# Patient Record
Sex: Male | Born: 1955 | Hispanic: No | Smoking: Former smoker
Health system: Southern US, Community
[De-identification: ages and names within clinical notes are randomized; demographics above are authoritative.]

## PROBLEM LIST (undated history)

## (undated) DIAGNOSIS — R7302 Impaired glucose tolerance (oral): Secondary | ICD-10-CM

## (undated) DIAGNOSIS — Z87438 Personal history of other diseases of male genital organs: Secondary | ICD-10-CM

## (undated) DIAGNOSIS — E785 Hyperlipidemia, unspecified: Secondary | ICD-10-CM

## (undated) DIAGNOSIS — I2510 Atherosclerotic heart disease of native coronary artery without angina pectoris: Secondary | ICD-10-CM

## (undated) DIAGNOSIS — Z951 Presence of aortocoronary bypass graft: Secondary | ICD-10-CM

## (undated) DIAGNOSIS — F528 Other sexual dysfunction not due to a substance or known physiological condition: Secondary | ICD-10-CM

## (undated) DIAGNOSIS — I10 Essential (primary) hypertension: Secondary | ICD-10-CM

## (undated) HISTORY — DX: Hyperlipidemia, unspecified: E78.5

## (undated) HISTORY — DX: Impaired glucose tolerance (oral): R73.02

## (undated) HISTORY — DX: Other sexual dysfunction not due to a substance or known physiological condition: F52.8

## (undated) HISTORY — PX: CARDIAC CATHETERIZATION: SHX172

## (undated) HISTORY — DX: Presence of aortocoronary bypass graft: Z95.1

## (undated) HISTORY — DX: Atherosclerotic heart disease of native coronary artery without angina pectoris: I25.10

---

## 2001-04-17 HISTORY — PX: TRANSTHORACIC ECHOCARDIOGRAM: SHX275

## 2005-07-11 ENCOUNTER — Encounter: Payer: Self-pay | Admitting: Internal Medicine

## 2006-04-30 ENCOUNTER — Ambulatory Visit: Payer: Self-pay | Admitting: Internal Medicine

## 2006-04-30 LAB — CONVERTED CEMR LAB
ALT: 29 units/L (ref 0–40)
AST: 27 units/L (ref 0–37)
Albumin: 3.7 g/dL (ref 3.5–5.2)
Alkaline Phosphatase: 68 units/L (ref 39–117)
BUN: 15 mg/dL (ref 6–23)
Basophils Absolute: 0 10*3/uL (ref 0.0–0.1)
Basophils Relative: 0 % (ref 0.0–1.0)
CO2: 24 meq/L (ref 19–32)
Calcium: 8.6 mg/dL (ref 8.4–10.5)
Chloride: 107 meq/L (ref 96–112)
Chol/HDL Ratio, serum: 9.6
Cholesterol: 203 mg/dL (ref 0–200)
Creatinine, Ser: 1.2 mg/dL (ref 0.4–1.5)
Eosinophil percent: 1.1 % (ref 0.0–5.0)
GFR calc non Af Amer: 69 mL/min
Glomerular Filtration Rate, Af Am: 83 mL/min/{1.73_m2}
Glucose, Bld: 97 mg/dL (ref 70–99)
HCT: 46.1 % (ref 39.0–52.0)
HDL: 21.1 mg/dL — ABNORMAL LOW (ref >39.0)
Hemoglobin: 15.5 g/dL (ref 13.0–17.0)
LDL DIRECT: 152.5 mg/dL
Lymphocytes Relative: 37.5 % (ref 12.0–46.0)
MCHC: 33.6 g/dL (ref 30.0–36.0)
MCV: 91.9 fL (ref 78.0–100.0)
Monocytes Absolute: 0.5 10*3/uL (ref 0.2–0.7)
Monocytes Relative: 7.2 % (ref 3.0–11.0)
Neutro Abs: 4.2 10*3/uL (ref 1.4–7.7)
Neutrophils Relative %: 54.2 % (ref 43.0–77.0)
PSA: 0.4 ng/mL (ref 0.10–4.00)
Platelets: 348 10*3/uL (ref 150–400)
Potassium: 4 meq/L (ref 3.5–5.1)
RBC: 5.01 M/uL (ref 4.22–5.81)
RDW: 12.1 % (ref 11.5–14.6)
Sodium: 138 meq/L (ref 135–145)
TSH: 1.81 microintl units/mL (ref 0.35–5.50)
Total Bilirubin: 0.6 mg/dL (ref 0.3–1.2)
Total Protein: 6.9 g/dL (ref 6.0–8.3)
Triglyceride fasting, serum: 240 mg/dL (ref 0–149)
VLDL: 48 mg/dL — ABNORMAL HIGH (ref 0–40)
WBC: 7.6 10*3/uL (ref 4.5–10.5)

## 2006-05-07 ENCOUNTER — Ambulatory Visit: Payer: Self-pay | Admitting: Internal Medicine

## 2007-06-28 ENCOUNTER — Encounter: Payer: Self-pay | Admitting: Internal Medicine

## 2007-07-21 ENCOUNTER — Encounter: Payer: Self-pay | Admitting: Internal Medicine

## 2007-09-06 ENCOUNTER — Encounter: Payer: Self-pay | Admitting: Internal Medicine

## 2007-10-04 ENCOUNTER — Encounter: Payer: Self-pay | Admitting: Internal Medicine

## 2008-01-03 ENCOUNTER — Encounter: Payer: Self-pay | Admitting: Internal Medicine

## 2008-04-23 ENCOUNTER — Ambulatory Visit: Payer: Self-pay | Admitting: Internal Medicine

## 2008-04-24 LAB — CONVERTED CEMR LAB
ALT: 27 units/L (ref 0–53)
AST: 24 units/L (ref 0–37)
Bilirubin Urine: NEGATIVE
Bilirubin, Direct: 0.1 mg/dL (ref 0.0–0.3)
CO2: 26 meq/L (ref 19–32)
Calcium: 9.3 mg/dL (ref 8.4–10.5)
Creatinine, Ser: 1.1 mg/dL (ref 0.4–1.5)
Eosinophils Relative: 1.7 % (ref 0.0–5.0)
Glucose, Bld: 112 mg/dL — ABNORMAL HIGH (ref 70–99)
HCT: 47.2 % (ref 39.0–52.0)
Hemoglobin: 16.5 g/dL (ref 13.0–17.0)
Hgb A1c MFr Bld: 5.8 % (ref 4.6–6.0)
Ketones, ur: NEGATIVE mg/dL
Monocytes Absolute: 0.7 10*3/uL (ref 0.1–1.0)
Monocytes Relative: 7.8 % (ref 3.0–12.0)
Neutro Abs: 4.7 10*3/uL (ref 1.4–7.7)
PSA: 0.61 ng/mL (ref 0.10–4.00)
RDW: 12.2 % (ref 11.5–14.6)
Total CHOL/HDL Ratio: 7
Total Protein, Urine: NEGATIVE mg/dL
Total Protein: 7.5 g/dL (ref 6.0–8.3)
Triglycerides: 141 mg/dL (ref 0–149)
Urine Glucose: NEGATIVE mg/dL

## 2008-05-01 ENCOUNTER — Ambulatory Visit: Payer: Self-pay | Admitting: Internal Medicine

## 2008-05-01 DIAGNOSIS — F528 Other sexual dysfunction not due to a substance or known physiological condition: Secondary | ICD-10-CM

## 2008-05-01 DIAGNOSIS — E739 Lactose intolerance, unspecified: Secondary | ICD-10-CM

## 2008-05-01 DIAGNOSIS — E785 Hyperlipidemia, unspecified: Secondary | ICD-10-CM | POA: Insufficient documentation

## 2008-05-01 HISTORY — DX: Hyperlipidemia, unspecified: E78.5

## 2008-05-01 HISTORY — DX: Other sexual dysfunction not due to a substance or known physiological condition: F52.8

## 2008-06-05 ENCOUNTER — Telehealth: Payer: Self-pay | Admitting: Internal Medicine

## 2008-06-06 ENCOUNTER — Ambulatory Visit: Payer: Self-pay | Admitting: Internal Medicine

## 2008-06-06 LAB — CONVERTED CEMR LAB
AST: 24 units/L (ref 0–37)
HDL: 21.9 mg/dL — ABNORMAL LOW (ref >39.0)
Hgb A1c MFr Bld: 5.7 % (ref 4.6–6.0)
LDL Cholesterol: 80 mg/dL (ref 0–99)
Total Bilirubin: 0.6 mg/dL (ref 0.3–1.2)
Total CHOL/HDL Ratio: 5.5

## 2008-06-07 LAB — CONVERTED CEMR LAB

## 2008-08-21 ENCOUNTER — Telehealth (INDEPENDENT_AMBULATORY_CARE_PROVIDER_SITE_OTHER): Payer: Self-pay | Admitting: *Deleted

## 2008-10-17 ENCOUNTER — Ambulatory Visit: Payer: Self-pay | Admitting: Internal Medicine

## 2008-12-18 ENCOUNTER — Telehealth (INDEPENDENT_AMBULATORY_CARE_PROVIDER_SITE_OTHER): Payer: Self-pay | Admitting: *Deleted

## 2009-04-25 ENCOUNTER — Ambulatory Visit: Payer: Self-pay | Admitting: Internal Medicine

## 2009-04-25 LAB — CONVERTED CEMR LAB
ALT: 55 units/L — ABNORMAL HIGH (ref 0–53)
AST: 36 units/L (ref 0–37)
Albumin: 4.2 g/dL (ref 3.5–5.2)
Alkaline Phosphatase: 68 units/L (ref 39–117)
BUN: 18 mg/dL (ref 6–23)
Basophils Relative: 0.3 % (ref 0.0–3.0)
Bilirubin Urine: NEGATIVE
CO2: 28 meq/L (ref 19–32)
Chloride: 105 meq/L (ref 96–112)
Eosinophils Relative: 2.6 % (ref 0.0–5.0)
Glucose, Bld: 99 mg/dL (ref 70–99)
Ketones, ur: NEGATIVE mg/dL
Leukocytes, UA: NEGATIVE
Lymphocytes Relative: 30.8 % (ref 12.0–46.0)
MCV: 92.3 fL (ref 78.0–100.0)
Monocytes Absolute: 0.9 10*3/uL (ref 0.1–1.0)
Monocytes Relative: 8.7 % (ref 3.0–12.0)
Neutrophils Relative %: 57.6 % (ref 43.0–77.0)
Nitrite: NEGATIVE
PSA: 0.48 ng/mL (ref 0.10–4.00)
Potassium: 4.1 meq/L (ref 3.5–5.1)
RBC: 5.26 M/uL (ref 4.22–5.81)
Sodium: 139 meq/L (ref 135–145)
Specific Gravity, Urine: 1.025 (ref 1.000–1.030)
TSH: 2.38 microintl units/mL (ref 0.35–5.50)
Total CHOL/HDL Ratio: 6
Total Protein: 7.7 g/dL (ref 6.0–8.3)
Urobilinogen, UA: 0.2 (ref 0.0–1.0)
WBC: 10 10*3/uL (ref 4.5–10.5)
pH: 5.5 (ref 5.0–8.0)

## 2009-05-03 ENCOUNTER — Ambulatory Visit: Payer: Self-pay | Admitting: Internal Medicine

## 2009-08-01 ENCOUNTER — Encounter: Payer: Self-pay | Admitting: Internal Medicine

## 2009-09-18 ENCOUNTER — Telehealth: Payer: Self-pay | Admitting: Internal Medicine

## 2009-09-24 ENCOUNTER — Telehealth: Payer: Self-pay | Admitting: Internal Medicine

## 2010-06-17 NOTE — Letter (Signed)
Summary: Referral - not able to see patient  [facility]  [address] [place], [place] [address]   Phone: [phone]  Fax: [phone]    August 01, 2009    Corwin Levins, M.D. [address] [place], [place] [address]   Re:   Samuel Hawkins DOB:  1955-12-04 MRN:   [id number]    Dear Dr. Jonny Ruiz:  Thank you for your kind referral of the above patient.  We have attempted to schedule the recommended procedure Screening Colonoscopy but have not been able to schedule because:  ___ The patient was not available by phone and/or has not returned our calls.   X  The patient declined to schedule the procedure at this time.  We appreciate the referral and hope that we will have the opportunity to treat this patient in the future.    Sincerely,    [facility] [phone]

## 2010-06-17 NOTE — Progress Notes (Signed)
  Phone Note Refill Request  on Sep 18, 2009 10:40 AM  Refills Requested: Medication #1:  NIASPAN 500 MG CR-TABS 1po once daily   Dosage confirmed as above?Dosage Confirmed   Notes: Publishing copy Initial call taken by: Scharlene Gloss,  Sep 18, 2009 10:40 AM    Prescriptions: NIASPAN 500 MG CR-TABS (NIACIN (ANTIHYPERLIPIDEMIC)) 1po once daily  #30 x 0   Entered by:   Zella Ball Ewing   Authorized by:   Corwin Levins MD   Signed by:   Scharlene Gloss on 09/18/2009   Method used:   Faxed to ...       [facility] [address] [phone]* (retail)       [address] [place], [place]  [address]       Ph: [phone]       Fax: [phone]   RxID:   [id number]

## 2010-06-17 NOTE — Progress Notes (Signed)
  Phone Note Call from Patient Call back at Home Phone [phone]   Caller: Patient Call For: DrJohn Summary of Call: Pt states his cholesterol medicine refill was denied. Pt is out of medicine. Pt also wants labs for cholesterol? Please advise Initial call taken by: Verdell Face,  Sep 18, 2009 10:21 AM  Follow-up for Phone Call        called pt informed medication denied he is no longer on. Informed Dr. Jonny Ruiz filled Niaspan 04/2009 #90 with 3 refills and he should have plenty. Pt. stated he could not afford #90 and filled what he could and only has 3 left. Pt.  request to schedule with Dr. Jonny Ruiz to discuss cholesterol and will fill #30 Niaspan at [facility] on [address] to cover pt. until OV. Follow-up by: Scharlene Gloss,  Sep 18, 2009 10:40 AM

## 2010-06-17 NOTE — Progress Notes (Signed)
Summary: Chol med?  Phone Note Call from Patient Call back at Home Phone [phone]   Caller: Patient Summary of Call: pt called stating that Niaspan is too expensive and he would like to switch back to Simvastatin. Pt says that he did not experience leg cramps on Simvastatin or previous statin. Okay to switch on med list and refill? Initial call taken by: Margaret Pyle, CMA,  Sep 24, 2009 2:11 PM  Follow-up for Phone Call        ok to go back - to robin to handle Follow-up by: Corwin Levins MD,  Sep 24, 2009 3:10 PM    New/Updated Medications: SIMVASTATIN 40 MG TABS (SIMVASTATIN) 1 by mouth once daily Prescriptions: SIMVASTATIN 40 MG TABS (SIMVASTATIN) 1 by mouth once daily  #30 x 6   Entered by:   Scharlene Gloss   Authorized by:   Corwin Levins MD   Signed by:   Scharlene Gloss on 09/24/2009   Method used:   Faxed to ...       [facility] [address] [phone]* (retail)       [address] [place], [place]  [address]       Ph: [phone]       Fax: [phone]   RxID:   [id number]

## 2010-07-10 ENCOUNTER — Other Ambulatory Visit: Payer: MEDICARE

## 2010-07-10 ENCOUNTER — Other Ambulatory Visit: Payer: Self-pay | Admitting: Internal Medicine

## 2010-07-10 ENCOUNTER — Encounter (INDEPENDENT_AMBULATORY_CARE_PROVIDER_SITE_OTHER): Payer: Self-pay | Admitting: *Deleted

## 2010-07-10 DIAGNOSIS — E785 Hyperlipidemia, unspecified: Secondary | ICD-10-CM

## 2010-07-10 DIAGNOSIS — Z0389 Encounter for observation for other suspected diseases and conditions ruled out: Secondary | ICD-10-CM

## 2010-07-10 DIAGNOSIS — Z0000 Encounter for general adult medical examination without abnormal findings: Secondary | ICD-10-CM

## 2010-07-10 LAB — HEPATIC FUNCTION PANEL
AST: 24 U/L (ref 0–37)
Albumin: 3.9 g/dL (ref 3.5–5.2)
Alkaline Phosphatase: 69 U/L (ref 39–117)
Total Protein: 7 g/dL (ref 6.0–8.3)

## 2010-07-10 LAB — BASIC METABOLIC PANEL
CO2: 27 mEq/L (ref 19–32)
GFR: 91.72 mL/min (ref >60.00)
Glucose, Bld: 91 mg/dL (ref 70–99)
Potassium: 4.3 mEq/L (ref 3.5–5.1)
Sodium: 138 mEq/L (ref 135–145)

## 2010-07-10 LAB — CBC WITH DIFFERENTIAL/PLATELET
Eosinophils Relative: 1.3 % (ref 0.0–5.0)
Monocytes Relative: 6.7 % (ref 3.0–12.0)
Neutrophils Relative %: 61.2 % (ref 43.0–77.0)
Platelets: 339 10*3/uL (ref 150.0–400.0)
WBC: 9.6 10*3/uL (ref 4.5–10.5)

## 2010-07-10 LAB — URINALYSIS, ROUTINE W REFLEX MICROSCOPIC
Specific Gravity, Urine: >=1.03 (ref 1.000–1.030)
Total Protein, Urine: NEGATIVE
Urine Glucose: NEGATIVE
Urobilinogen, UA: 0.2 (ref 0.0–1.0)

## 2010-07-10 LAB — LIPID PANEL
HDL: 26.9 mg/dL — ABNORMAL LOW (ref >39.00)
Total CHOL/HDL Ratio: 8

## 2010-07-10 LAB — LDL CHOLESTEROL, DIRECT: Direct LDL: 176.2 mg/dL

## 2010-07-10 LAB — PSA: PSA: 0.56 ng/mL (ref 0.10–4.00)

## 2010-07-17 ENCOUNTER — Encounter (INDEPENDENT_AMBULATORY_CARE_PROVIDER_SITE_OTHER): Payer: Managed Care, Other (non HMO) | Admitting: Internal Medicine

## 2010-07-17 ENCOUNTER — Encounter: Payer: Self-pay | Admitting: Internal Medicine

## 2010-07-17 DIAGNOSIS — Z23 Encounter for immunization: Secondary | ICD-10-CM

## 2010-07-17 DIAGNOSIS — Z0000 Encounter for general adult medical examination without abnormal findings: Secondary | ICD-10-CM

## 2010-07-17 DIAGNOSIS — R3129 Other microscopic hematuria: Secondary | ICD-10-CM | POA: Insufficient documentation

## 2010-07-18 ENCOUNTER — Encounter (INDEPENDENT_AMBULATORY_CARE_PROVIDER_SITE_OTHER): Payer: Self-pay | Admitting: *Deleted

## 2010-07-24 NOTE — Assessment & Plan Note (Signed)
Summary: cpx/lb   Vital Signs:  Patient profile:   55 year old male Height:      67 inches Weight:      180.38 pounds BMI:     28.35 O2 Sat:      96 % on Room air Temp:     98.7 degrees F oral Pulse rate:   56 / minute BP sitting:   110 / 72  (left arm) Cuff size:   regular  Vitals Entered By: Zella Ball Ewing CMA (AAMA) (July 17, 2010 8:22 AM)  O2 Flow:  Room air  CC: Adult Physical/RE   CC:  Adult Physical/RE.  History of Present Illness: here for wellness and f/u;p overall doing ok, but Pt denies CP, worsening sob, doe, wheezing, orthopnea, pnd, worsening LE edema, palps, dizziness or syncope  Pt denies new neuro symptoms such as headache, facial or extremity weakness  Pt denies polydipsia, polyuria, or low sugar symptoms such as shakiness improved with eating.  Overall good compliance with meds, trying to follow low chol, DM diet, wt stable, little excercise however  Overall good compliance with meds, and good tolerability.  No fever, wt loss, night sweats, loss of appetite or other constitutional symptoms  Denies worsening depressive symptoms, suicidal ideation, or panick, though has some ongoing anxiety and ED symtpoms;  [facility]  Pt states good ability with ADL's, low fall risk, home safety reviewed and adequate, no significant change in hearing or vision, trying to follow lower chol diet, and occasionally active only with regular excercise.  Could not tolerate the simvastatin due to myalgias last yr, and was not able to get the colonscopy done.  No gross hematuria or GU symtpoms today  Preventive Screening-Counseling & Management      Drug Use:  no.    Problems Prior to Update: 1)  Preventive Health Care  (ICD-V70.0) 2)  Sexually Transmitted Disease, Exposure To  (ICD-V01.6) 3)  Glucose Intolerance  (ICD-271.3) 4)  Erectile Dysfunction  (ICD-302.72) 5)  Hyperlipidemia  (ICD-272.4) 6)  Preventive Health Care  (ICD-V70.0)  Medications Prior to  Update: 1)  Niaspan 500 Mg Cr-Tabs (Niacin (Antihyperlipidemic)) .Marland Kitchen.. 1po Once Daily 2)  Adult Aspirin Ec Low Strength 81 Mg Tbec (Aspirin) .Marland Kitchen.. 1 By Mouth Once Daily 3)  Levitra 20 Mg Tabs (Vardenafil Hcl) .Marland Kitchen.. 1 By Mouth Once Daily As Needed 4)  Simvastatin 40 Mg Tabs (Simvastatin) .Marland Kitchen.. 1 By Mouth Once Daily  Current Medications (verified): 1)  Adult Aspirin Ec Low Strength 81 Mg Tbec (Aspirin) .Marland Kitchen.. 1 By Mouth Once Daily 2)  Levitra 20 Mg Tabs (Vardenafil Hcl) .Marland Kitchen.. 1 By Mouth Once Daily As Needed 3)  Crestor 10 Mg Tabs (Rosuvastatin Calcium) .Marland Kitchen.. 1 By Mouth Once Daily  Allergies (verified): 1)  * Simcor 2)  Lipitor (Atorvastatin) 3)  Simvastatin  Past History:  Past Medical History: Last updated: 05/01/2008 Hyperlipidemia glucose intolerance E.D.  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 [employer]  Divorced 1 daughter Former Smoker Alcohol use-no Drug use-no  Risk Factors: Smoking Status: quit (05/01/2008)  Social History: work - Naval architect Firefighter  for private firm and [employer]  Divorced 1 daughter Former Smoker Alcohol use-no Drug use-no Drug Use:  no  Review of Systems  The patient denies anorexia, fever, vision loss, decreased hearing, hoarseness, chest pain, syncope, dyspnea on exertion, peripheral edema, prolonged cough, headaches, hemoptysis, abdominal pain, melena, hematochezia, severe indigestion/heartburn, hematuria, muscle weakness, suspicious skin lesions, transient blindness, difficulty walking, depression, unusual weight change, abnormal bleeding, enlarged lymph nodes, and angioedema.         all otherwise negative per pt -  still wants STD check done this yr as per last yr  Physical  Exam  General:  alert and overweight-appearing.   Head:  normocephalic and atraumatic.   Eyes:  vision grossly intact, pupils equal, and pupils round.   Ears:  R ear normal and L ear normal.   Nose:  no external deformity and no nasal discharge.   Mouth:  no gingival abnormalities and pharynx pink and moist.   Neck:  supple and no masses.   Lungs:  normal respiratory effort and normal breath sounds.   Heart:  normal rate and regular rhythm.   Abdomen:  soft, non-tender, and normal bowel sounds.   Msk:  no joint tenderness and no joint swelling.   Extremities:  no edema, no erythema  Neurologic:  cranial nerves II-XII intact and strength normal in all extremities.   Skin:  color normal and no rashes.   Psych:  not depressed appearing and moderately anxious.     Impression & Recommendations:  Problem # 1:  Preventive Health Care (ICD-V70.0) Overall doing well, age appropriate education and counseling updated, referral for preventive services and immunizations addressed, dietary counseling and smoking status adressed , most recent labs reviewed, ecg reviewed I have personally reviewed and have noted 1.The patient's medical and social history 2.Their use of alcohol, tobacco or illicit drugs 3.Their current medications and supplements 4. Functional ability including ADL's, fall risk, home safety risk, hearing & visual impairment 5.Diet and physical activities 6.Evidence for depression or mood disorders The patients weight, height, BMI  have been recorded in the chart I have made referrals, counseling and provided education to the patient based review of the above  Orders: EKG w/ Interpretation (93000) Gastroenterology Referral (GI)  Problem # 2:  HYPERLIPIDEMIA (ICD-272.4)  The following medications were removed from the medication list:    Niaspan 500 Mg Cr-tabs (Niacin (antihyperlipidemic)) .Marland Kitchen... 1po once daily His updated medication list for this problem includes:    Crestor 10  Mg Tabs (Rosuvastatin calcium) .Marland Kitchen... 1 by mouth once daily  Labs Reviewed: SGOT: 24 (07/10/2010)   SGPT: 26 (07/10/2010)   HDL:26.90 (07/10/2010), 24.60 (04/25/2009)  LDL:102 (04/25/2009), 80 (16/02/9603)  Chol:224 (07/10/2010), 154 (04/25/2009)  Trig:164.0 (07/10/2010), 136.0 (04/25/2009) treat as above, f/u any worsening signs or symptoms , f/u la bs 4 wks  Problem # 3:  MICROSCOPIC HEMATURIA (ICD-599.72) ok for full urological w/u - r/o malignancy Orders: Urology Referral (Urology)  Problem # 4:  SEXUALLY TRANSMITTED DISEASE, EXPOSURE TO (ICD-V01.6) for labs in 4 wks when has lipid f/u  Complete Medication List: 1)  Adult Aspirin Ec Low Strength 81 Mg Tbec (Aspirin) .Marland Kitchen.. 1 by mouth once daily 2)  Levitra 20 Mg Tabs (Vardenafil hcl) .Marland Kitchen.. 1 by mouth once daily as needed 3)  Crestor 10 Mg Tabs (Rosuvastatin calcium) .Marland Kitchen.. 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 11)  Please call the number on the [facility] Card for results of your testing  12)  You will be contacted about the referral(s) to: urology, and colonoscopy 13)  you had the tetanus shot today 14)  Please schedule a follow-up appointment in 1 year., or sooner if needed Prescriptions: LEVITRA 20 MG TABS (VARDENAFIL HCL) 1 by mouth once daily as needed  #5 x 11   Entered and Authorized by:   Corwin Levins MD   Signed by:   Corwin Levins MD on 07/17/2010   Method used:   Print then Give to Patient   RxID:   [id number] CRESTOR 10 MG TABS (ROSUVASTATIN CALCIUM) 1 by mouth once daily   #90 x 3   Entered and Authorized by:   Corwin Levins MD   Signed by:   Corwin Levins MD on 07/17/2010   Method used:   [facility] [address] [facility] [phone]* (retail)       [address] [place], [place]  [address]       Ph: [phone]       Fax: [phone]   RxID:   [id number]    Orders Added: 1)  EKG w/ Interpretation [93000] 2)  Urology Referral [Urology] 3)  Gastroenterology Referral [GI] 4)  Est. Patient 40-64 years [99396]  Appended Document: Immunization Entry      Orders Added: 1)  Tdap => 64yrs IM [90715] 2)  Admin 1st Vaccine [90471]    Immunizations Administered:  Tetanus Vaccine:    Vaccine Type: Tdap    Site: left deltoid    Mfr: Sanofi Pasteur    Dose: 0.5 ml    Route: IM    Given by: Zella Ball Ewing CMA (AAMA)    Exp. Date: 06/19/2011    Lot #: [id number]    VIS given: 04/04/08 version given July 17, 2010.

## 2010-07-24 NOTE — Letter (Signed)
Summary: [facility] Consult Scheduled Letter  [facility]  [address] [place], [place] [address]   Phone: [phone]  Fax: [phone]      07/18/2010 MRN: [id number]  ALAIN DESCHENE [address] [place], [place]  [address]  [place]    Dear Mr. SWAILS,      We have scheduled an appointment for you.  At the recommendation of Dr.John, we have scheduled you a consult with Dr Aldean Ast on 08/11/10 at 11:15am.  Their phone number is [phone].  If this appointment day and time is not convenient for you, please feel free to call the office of the doctor you are being referred to at the number listed above and reschedule the appointment.    [facility] [address] [place], [place] [address]     Thank you,  Patient Care Coordinator [facility]

## 2010-07-31 ENCOUNTER — Telehealth: Payer: Self-pay | Admitting: Internal Medicine

## 2010-08-05 NOTE — Progress Notes (Signed)
Summary: Crestor SE  Phone Note Call from Patient Call back at Home Phone [phone]   Caller: Patient Summary of Call: Pt called stating Rx given for cholesterol is causing muscle cramps, stomach upset and pain as well as a rash. Pt is requesting alternative medication, please advise. Initial call taken by: Margaret Pyle, CMA,  July 31, 2010 3:13 PM  Follow-up for Phone Call        ok to change to generic lipitor 10 mg per day -   for LAB f/u in 4 wks:   lipids 272.0                                    hepatic function panel:   v58.69  robin - to call pt to inform, and arrange any above, and do rx Follow-up by: Corwin Levins MD,  July 31, 2010 4:50 PM  Additional Follow-up for Phone Call Additional follow up Details #1::        called pt left msg. to call back Additional Follow-up by: Robin Ewing CMA Duncan Dull),  August 01, 2010 9:41 AM    Additional Follow-up for Phone Call Additional follow up Details #2::    called the patient and he has been on Liptor once before and had a reaction. He is requesting something else. Did schedule the pt. for labs on August 29, 2010 Follow-up by: Zella Ball Ewing CMA Duncan Dull),  August 01, 2010 11:42 AM  Additional Follow-up for Phone Call Additional follow up Details #3:: Details for Additional Follow-up Action Taken: I apologize, this is on his allergy and intolerance list;    d/c the lipitor; ok to start pravachol 40 mg once daily  with labs as above - robin to handle rx and labs Additional Follow-up by: Corwin Levins MD,  August 01, 2010 12:02 PM  New/Updated Medications: PRAVACHOL 40 MG TABS (PRAVASTATIN SODIUM) 1 by mouth once daily Prescriptions: PRAVACHOL 40 MG TABS (PRAVASTATIN SODIUM) 1 by mouth once daily  #30 x 11   Entered by:   Zella Ball Ewing CMA (AAMA)   Authorized by:   Corwin Levins MD   Signed by:   Scharlene Gloss CMA (AAMA) on 08/01/2010   Method used:   [facility] [phone]* (retail)       [address] [place], [place]  [address]       Ph: [phone]       Fax: [phone]   RxID:   [id number]   Appended Document: Crestor SE called patient informed of above information

## 2010-08-14 ENCOUNTER — Other Ambulatory Visit: Payer: Self-pay | Admitting: Internal Medicine

## 2010-08-14 ENCOUNTER — Other Ambulatory Visit: Payer: Managed Care, Other (non HMO)

## 2010-08-14 DIAGNOSIS — Z202 Contact with and (suspected) exposure to infections with a predominantly sexual mode of transmission: Secondary | ICD-10-CM

## 2010-08-14 DIAGNOSIS — Z79899 Other long term (current) drug therapy: Secondary | ICD-10-CM

## 2010-08-14 DIAGNOSIS — E7800 Pure hypercholesterolemia, unspecified: Secondary | ICD-10-CM

## 2010-08-29 ENCOUNTER — Other Ambulatory Visit: Payer: Managed Care, Other (non HMO)

## 2011-04-28 ENCOUNTER — Encounter: Payer: Self-pay | Admitting: *Deleted

## 2011-04-28 ENCOUNTER — Inpatient Hospital Stay (HOSPITAL_COMMUNITY)
Admission: EM | Admit: 2011-04-28 | Discharge: 2011-05-08 | DRG: 234 | Disposition: A | Discharge status: Home / Self Care | Payer: Managed Care, Other (non HMO) | Attending: Thoracic Surgery (Cardiothoracic Vascular Surgery) | Admitting: Thoracic Surgery (Cardiothoracic Vascular Surgery)

## 2011-04-28 ENCOUNTER — Other Ambulatory Visit: Payer: Self-pay

## 2011-04-28 ENCOUNTER — Emergency Department (HOSPITAL_COMMUNITY): Payer: Managed Care, Other (non HMO)

## 2011-04-28 DIAGNOSIS — E785 Hyperlipidemia, unspecified: Secondary | ICD-10-CM | POA: Diagnosis present

## 2011-04-28 DIAGNOSIS — Z23 Encounter for immunization: Secondary | ICD-10-CM

## 2011-04-28 DIAGNOSIS — E739 Lactose intolerance, unspecified: Secondary | ICD-10-CM | POA: Diagnosis present

## 2011-04-28 DIAGNOSIS — I200 Unstable angina: Secondary | ICD-10-CM | POA: Diagnosis present

## 2011-04-28 DIAGNOSIS — R55 Syncope and collapse: Secondary | ICD-10-CM | POA: Diagnosis present

## 2011-04-28 DIAGNOSIS — I2510 Atherosclerotic heart disease of native coronary artery without angina pectoris: Principal | ICD-10-CM | POA: Diagnosis present

## 2011-04-28 HISTORY — DX: Hyperlipidemia, unspecified: E78.5

## 2011-04-28 HISTORY — DX: Impaired glucose tolerance (oral): R73.02

## 2011-04-28 HISTORY — DX: Personal history of other diseases of male genital organs: Z87.438

## 2011-04-28 LAB — PROTIME-INR
INR: 1.03 (ref 0.00–1.49)
Prothrombin Time: 13.7 seconds (ref 11.6–15.2)

## 2011-04-28 LAB — COMPREHENSIVE METABOLIC PANEL
CO2: 22 mEq/L (ref 19–32)
Calcium: 9.2 mg/dL (ref 8.4–10.5)
Creatinine, Ser: 1.2 mg/dL (ref 0.50–1.35)
GFR calc Af Amer: 78 mL/min — ABNORMAL LOW (ref >90)
GFR calc non Af Amer: 67 mL/min — ABNORMAL LOW (ref >90)
Glucose, Bld: 93 mg/dL (ref 70–99)
Total Protein: 7.8 g/dL (ref 6.0–8.3)

## 2011-04-28 LAB — CBC
MCH: 31.6 pg (ref 26.0–34.0)
MCHC: 36.7 g/dL — ABNORMAL HIGH (ref 30.0–36.0)
Platelets: 292 10*3/uL (ref 150–400)
RBC: 4.87 MIL/uL (ref 4.22–5.81)

## 2011-04-28 LAB — CARDIAC PANEL(CRET KIN+CKTOT+MB+TROPI)
CK, MB: 5.9 ng/mL — ABNORMAL HIGH (ref 0.3–4.0)
Total CK: 442 U/L — ABNORMAL HIGH (ref 7–232)

## 2011-04-28 LAB — APTT: aPTT: 28 seconds (ref 24–37)

## 2011-04-28 MED ORDER — SODIUM CHLORIDE 0.9 % IV SOLN
999.0000 mL | INTRAVENOUS | Status: DC
  Administered 2011-04-28: 1000 mL via INTRAVENOUS

## 2011-04-28 MED ORDER — ASPIRIN 81 MG PO CHEW
324.0000 mg | CHEWABLE_TABLET | Freq: Once | ORAL | Status: AC
  Administered 2011-04-28: 324 mg via ORAL
  Filled 2011-04-28: qty 4

## 2011-04-28 NOTE — ED Notes (Signed)
Family at bedside. Pt ready for d/c/ pt told that MD/PA/NP would reassess him. Pt says he feels fine now and is pain free at this time. Pt here with c/o dizziness and blacking out assoc with cp and diaphoresis.

## 2011-04-28 NOTE — ED Notes (Signed)
No chest pain at this time.  Pt. Resting comfortably.

## 2011-04-28 NOTE — ED Notes (Signed)
Pt states he was having heartburn last night. Pt states this morning he started to fill fatigue and chest tightness. Pt states he pulled his car over and black out in his car

## 2011-04-28 NOTE — ED Provider Notes (Signed)
History     CSN: [id number] Arrival date & time: 04/28/2011  8:29 PM   First MD Initiated Contact with Patient 04/28/11 2133      No chief complaint on file.   (Consider location/radiation/quality/duration/timing/severity/associated sxs/prior treatment) HPI Comments: The patient is a 55 year old male with a chief complaint of chest pain and syncope earlier today at around 12:30 in the afternoon. 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 The patient estimates that he had passed out for no more than 30 seconds before he came around and states that after that the chest pain gradually subsided. He is currently chest pain-free. He has a medical history significant for risk factors of smoking, hyperlipidemia which is untreated, and early coronary artery disease in his family vis--vis his brother who died of a myocardial infarction in his 26s. At present he has no chest pain and is in no acute distress. He denies lower extremity edema or recent illness. He reports waxing and waning chest pain earlier this morning before 12:30 associated with lightheadedness. The patient has not previously been evaluated for chest pain or risk stratified for coronary artery disease.  The history is provided by the patient and the spouse.    History reviewed. No pertinent past medical history.  History reviewed. No pertinent past surgical history.  History reviewed. No pertinent family history.  History  Substance Use Topics  . Smoking status: Current Some Day Smoker  . Smokeless tobacco: Not on file  . Alcohol Use: Yes     occa      Review of Systems  Constitutional: Positive for diaphoresis and fatigue. Negative for fever, chills,  activity change and appetite change.  HENT: Negative for ear pain, congestion, sore throat, rhinorrhea, neck pain, postnasal drip and sinus pressure.   Eyes: Negative for visual disturbance.  Respiratory: Positive for chest tightness. Negative for cough, shortness of breath and wheezing.   Cardiovascular: Positive for chest pain. Negative for palpitations and leg swelling.  Gastrointestinal: Negative for nausea, vomiting, abdominal pain and diarrhea.  Genitourinary: Negative.   Musculoskeletal: Negative for myalgias and back pain.  Skin: Negative for color change, pallor and rash.  Neurological: Positive for syncope and light-headedness. Negative for dizziness, tremors, weakness, numbness and headaches.  Hematological: Does not bruise/bleed easily.  Psychiatric/Behavioral: Negative for confusion.    Allergies  Atorvastatin and Simvastatin  Home Medications  No current outpatient prescriptions on file.  BP 111/62  Pulse 66  Temp(Src) 98.9 F (37.2 C) (Oral)  Resp 20  SpO2 98%  Physical Exam  Nursing note and vitals reviewed. Constitutional: He is oriented to person, place, and time. He appears well-developed and well-nourished. No distress.  HENT:  Head: Normocephalic and atraumatic.  Mouth/Throat: Oropharynx is clear and moist.  Eyes: EOM are normal. Pupils are equal, round, and reactive to light.  Neck: Normal range of motion. Neck supple. No JVD present. No tracheal deviation present.  Cardiovascular: Normal rate, regular rhythm, S1 normal, S2 normal, normal heart sounds and intact distal pulses.   No extrasystoles are present. PMI is not displaced.  Exam reveals no gallop and no friction rub.   No murmur heard. Pulmonary/Chest: Effort normal and breath sounds normal. No accessory muscle usage or stridor. Not tachypneic. No respiratory distress.  He has no decreased breath sounds. He has no wheezes. He has no rhonchi. He has no rales. He exhibits no tenderness, no bony  tenderness, no crepitus and no retraction.  Abdominal: Soft. Bowel sounds are normal. He exhibits no distension and no mass. There is no tenderness. There is no rebound and no guarding.  Musculoskeletal: Normal range of motion. He exhibits no edema and no tenderness.  Neurological: He is alert and oriented to person, place, and time. No cranial nerve deficit. He exhibits normal muscle tone.  Skin: Skin is warm and dry. No rash noted. He is not diaphoretic. No erythema. No pallor.  Psychiatric: He has a normal mood and affect. His behavior is normal. Judgment and thought content normal.    ED Course  Procedures (including critical care time)   Date: 04/28/2011  Rate: 59  Rhythm: sinus bradycardia  QRS Axis: normal  Intervals: normal  ST/T Wave abnormalities: normal  Conduction Disutrbances:none  Narrative Interpretation: Non-provocative EKG  Old EKG Reviewed: none available    Labs Reviewed  CBC - Abnormal; Notable for the following:    MCHC 36.7 (*)    All other components within normal limits  CARDIAC PANEL(CRET KIN+CKTOT+MB+TROPI) - Abnormal; Notable for the following:    Total CK 442 (*)    CK, MB 5.9 (*)    All other components within normal limits  COMPREHENSIVE METABOLIC PANEL - Abnormal; Notable for the following:    Sodium 133 (*)    GFR calc non Af Amer 67 (*)    GFR calc Af Amer 78 (*)    All other components within normal limits  LIPASE, BLOOD  PROTIME-INR  APTT   Dg Chest 2 View  04/28/2011  *RADIOLOGY REPORT*  Clinical Data: Chest pain, fever, syncope.  Smoker.  CHEST - 2 VIEW  Comparison: None.  Findings: The heart size and pulmonary vascularity are normal. The lungs appear clear and expanded without focal air space disease or consolidation. No blunting of the costophrenic angles.  No pneumothorax.  Probable old right rib fracture.  Mild degenerative changes in the spine.  IMPRESSION: No evidence of active pulmonary disease.  Original Report Authenticated By:  Marlon Pel, M.D.     No diagnosis found.    MDM  The differential diagnosis includes arrhythmia, coronary artery disease and ACS, acute myocardial infarction, anemia, electrolyte abnormality, dehydration among other potential etiologies of his symptoms. The patient's symptoms themselves combined with risk factors and lack of previous risk stratification for coronary artery disease direct me towards admitting the patient for further evaluation before he would be deemed suitable for discharge home.        Felisa Bonier, MD 04/28/11 [phone]

## 2011-04-29 ENCOUNTER — Encounter (HOSPITAL_COMMUNITY): Payer: Self-pay | Admitting: Internal Medicine

## 2011-04-29 DIAGNOSIS — R55 Syncope and collapse: Secondary | ICD-10-CM | POA: Diagnosis present

## 2011-04-29 LAB — CARDIAC PANEL(CRET KIN+CKTOT+MB+TROPI)
CK, MB: 6.2 ng/mL (ref 0.3–4.0)
Relative Index: 1.8 (ref 0.0–2.5)
Total CK: 346 U/L — ABNORMAL HIGH (ref 7–232)
Total CK: 307 U/L — ABNORMAL HIGH (ref 7–232)
Troponin I: <0.3 ng/mL (ref <0.30)

## 2011-04-29 LAB — CREATININE, SERUM: Creatinine, Ser: 0.83 mg/dL (ref 0.50–1.35)

## 2011-04-29 LAB — CBC
HCT: 39.5 % (ref 39.0–52.0)
Hemoglobin: 13.4 g/dL (ref 13.0–17.0)
MCHC: 33.9 g/dL (ref 30.0–36.0)
RDW: 12.9 % (ref 11.5–15.5)
WBC: 7.1 10*3/uL (ref 4.0–10.5)

## 2011-04-29 LAB — TSH: TSH: 1.444 u[IU]/mL (ref 0.350–4.500)

## 2011-04-29 LAB — HEMOGLOBIN A1C
Hgb A1c MFr Bld: 5.5 % (ref <5.7)
Mean Plasma Glucose: 111 mg/dL (ref <117)

## 2011-04-29 MED ORDER — ACETAMINOPHEN 325 MG PO TABS: 650.0000 mg | ORAL_TABLET | Freq: Four times a day (QID) | ORAL | Status: DC | PRN

## 2011-04-29 MED ORDER — ENOXAPARIN SODIUM 40 MG/0.4ML ~~LOC~~ SOLN
40.0000 mg | Freq: Every day | SUBCUTANEOUS | Status: DC
  Administered 2011-04-29 (×2): 40 mg via SUBCUTANEOUS
  Filled 2011-04-29 (×3): qty 0.4

## 2011-04-29 MED ORDER — ROSUVASTATIN CALCIUM 10 MG PO TABS
10.0000 mg | ORAL_TABLET | Freq: Every day | ORAL | Status: DC
  Administered 2011-04-29 – 2011-04-30 (×2): 10 mg via ORAL
  Filled 2011-04-29 (×2): qty 1

## 2011-04-29 MED ORDER — SODIUM CHLORIDE 0.9 % IV SOLN: 250.0000 mL | INTRAVENOUS | Status: DC | PRN

## 2011-04-29 MED ORDER — DIAZEPAM 5 MG PO TABS
10.0000 mg | ORAL_TABLET | ORAL | Status: AC
  Administered 2011-04-30: 10 mg via ORAL
  Filled 2011-04-29: qty 2

## 2011-04-29 MED ORDER — ACETAMINOPHEN 650 MG RE SUPP: 650.0000 mg | Freq: Four times a day (QID) | RECTAL | Status: DC | PRN

## 2011-04-29 MED ORDER — SODIUM CHLORIDE 0.9 % IV SOLN
INTRAVENOUS | Status: DC
  Administered 2011-04-29 – 2011-04-30 (×4): via INTRAVENOUS

## 2011-04-29 MED ORDER — METOPROLOL SUCCINATE ER 25 MG PO TB24
25.0000 mg | ORAL_TABLET | Freq: Every day | ORAL | Status: DC
  Administered 2011-04-29: 25 mg via ORAL
  Filled 2011-04-29 (×2): qty 1

## 2011-04-29 MED ORDER — MORPHINE SULFATE 2 MG/ML IJ SOLN: 2.0000 mg | INTRAMUSCULAR | Status: DC | PRN

## 2011-04-29 MED ORDER — INFLUENZA VIRUS VACC SPLIT PF IM SUSP
0.5000 mL | INTRAMUSCULAR | Status: AC
  Filled 2011-04-29 (×2): qty 0.5

## 2011-04-29 MED ORDER — SODIUM CHLORIDE 0.9 % IJ SOLN
3.0000 mL | INTRAMUSCULAR | Status: DC | PRN
  Administered 2011-05-01: 6 mL via INTRAVENOUS

## 2011-04-29 MED ORDER — ASPIRIN 81 MG PO CHEW
324.0000 mg | CHEWABLE_TABLET | ORAL | Status: DC
  Filled 2011-04-29: qty 4

## 2011-04-29 MED ORDER — ZOLPIDEM TARTRATE 5 MG PO TABS
10.0000 mg | ORAL_TABLET | Freq: Every evening | ORAL | Status: DC | PRN
  Administered 2011-04-29: 10 mg via ORAL
  Filled 2011-04-29: qty 1

## 2011-04-29 NOTE — Progress Notes (Signed)
H&P reviewed ,patient seen and examined ,admitted for chest pain and syncope . Currently chest pain free ,however his description for his pain sounds typical ,associatied with diaphoresis .He is also c/o watery nonbloody diarrhea for 2 days  PLAN: 1/chest pain: sounds typical ,history of hyperlipidemia and glucose intolerance  ,his brother died form heart attack in his 56s, he smokes cigarettes sometimes . Given  Risk factors ,will consult cardiology(Dr Ganj) for possible stress test. 2/Syncope: Continue to observe on telemtry  2 D echo  Unremarkable  Check  Vitals or orthostasis and carotid doppler  Continue IVF  3/Diarrhea: Check stool for c diff PCR  Cont IVF

## 2011-04-29 NOTE — H&P (Signed)
PCP:   Oliver Barre, MD, MD   Chief Complaint: Transient chest discomfort  followed by syncope.   HPI: Samuel Hawkins is an 55 y.o. male with rather benign past medical history on no chronic medication, presents to [facility] long emergency room with a transient loss of consciousness. He has been active and never had any exertional chest pain in the past. 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  He denied any radiation of his pain, cough, pleuritic chest pain, fever, chills, abdominal cramps or pain, or any other symptomology.  Evaluation in emergency room included an unremarkable EKG and negative troponin, but he did have elevated CPK and MB fraction. His chest x-ray is clear. His other laboratory studies were unremarkable.  Rewiew of Systems:  The patient denies anorexia, fever, weight loss,, vision loss, decreased hearing, hoarseness,  dyspnea on exertion, peripheral edema, balance deficits, hemoptysis, abdominal pain, melena, hematochezia, severe indigestion/heartburn, hematuria, incontinence, genital sores, muscle weakness, suspicious skin lesions, transient blindness, difficulty walking, depression, unusual weight change, abnormal bleeding, enlarged lymph nodes, angioedema, and breast masses.    History reviewed. No pertinent past medical history.  History reviewed. No pertinent past surgical history.  Medications:  HOME MEDS: Prior to Admission medications   Not on File     Allergies:  Allergies  Allergen Reactions  . Atorvastatin     REACTION: muscle cramp  . Simvastatin     REACTION: muscle cramp    Social History:   reports that he has been smoking.  He does not have any smokeless tobacco history on file. He reports that he drinks alcohol. His drug history not on file.  Family History: History reviewed. No pertinent family  history.   Physical Exam: Filed Vitals:   04/28/11 2050 04/28/11 2327  BP: 111/62 109/44  Pulse: 66 52  Temp: 98.9 F (37.2 C) 97.8 F (36.6 C)  TempSrc: Oral Oral  Resp: 20 18  SpO2: 98% 98%   Blood pressure 109/44, pulse 52, temperature 97.8 F (36.6 C), temperature source Oral, resp. rate 18, SpO2 98.00%.  GEN:  Pleasant person lying in the stretcher in no acute distress; cooperative with exam PSYCH:  alert and oriented x4; does not appear anxious does not appear depressed; affect is normal HEENT: Mucous membranes pink and anicteric; PERRLA; EOM intact; no cervical lymphadenopathy nor thyromegaly or carotid bruit; no JVD; Breasts:: Not examined CHEST WALL: No tenderness CHEST: Normal respiration, clear to auscultation bilaterally HEART: Regular rate and rhythm; no murmurs rubs or gallops BACK: No kyphosis or scoliosis; no CVA tenderness ABDOMEN: Obese, soft non-tender; no masses, no organomegaly, normal abdominal bowel sounds; no pannus; no intertriginous candida. Rectal Exam: Not done EXTREMITIES: No bone or joint deformity; age-appropriate arthropathy of the hands and knees; no edema; no ulcerations. Genitalia: not examined PULSES: 2+ and symmetric SKIN: Normal hydration no rash or ulceration CNS: Cranial nerves 2-12 grossly intact no focal neurologic deficit   Labs & Imaging Results for orders placed during the hospital encounter of 04/28/11 (from the past 48 hour(s))  CBC     Status: Abnormal   Collection Time   04/28/11  9:38 PM      Component Value Range Comment   WBC 7.3  4.0 - 10.5 (K/uL)    RBC 4.87  4.22 - 5.81 (MIL/uL)    Hemoglobin 15.4  13.0 - 17.0 (g/dL)  HCT 42.0  39.0 - 52.0 (%)    MCV 86.2  78.0 - 100.0 (fL)    MCH 31.6  26.0 - 34.0 (pg)    MCHC 36.7 (*) 30.0 - 36.0 (g/dL)    RDW 40.9  81.1 - 91.4 (%)    Platelets 292  150 - 400 (K/uL)   CARDIAC PANEL(CRET KIN+CKTOT+MB+TROPI)     Status: Abnormal   Collection Time   04/28/11  9:38 PM       Component Value Range Comment   Total CK 442 (*) 7 - 232 (U/L)    CK, MB 5.9 (*) 0.3 - 4.0 (ng/mL)    Troponin I <0.30  <0.30 (ng/mL)    Relative Index 1.3  0.0 - 2.5    COMPREHENSIVE METABOLIC PANEL     Status: Abnormal   Collection Time   04/28/11  9:38 PM      Component Value Range Comment   Sodium 133 (*) 135 - 145 (mEq/L)    Potassium 3.6  3.5 - 5.1 (mEq/L)    Chloride 101  96 - 112 (mEq/L)    CO2 22  19 - 32 (mEq/L)    Glucose, Bld 93  70 - 99 (mg/dL)    BUN 19  6 - 23 (mg/dL)    Creatinine, Ser 7.82  0.50 - 1.35 (mg/dL)    Calcium 9.2  8.4 - 10.5 (mg/dL)    Total Protein 7.8  6.0 - 8.3 (g/dL)    Albumin 3.7  3.5 - 5.2 (g/dL)    AST 28  0 - 37 (U/L)    ALT 24  0 - 53 (U/L)    Alkaline Phosphatase 70  39 - 117 (U/L)    Total Bilirubin 0.3  0.3 - 1.2 (mg/dL)    GFR calc non Af Amer 67 (*) >90 (mL/min)    GFR calc Af Amer 78 (*) >90 (mL/min)   PROTIME-INR     Status: Normal   Collection Time   04/28/11  9:38 PM      Component Value Range Comment   Prothrombin Time 13.7  11.6 - 15.2 (seconds)    INR 1.03  0.00 - 1.49    APTT     Status: Normal   Collection Time   04/28/11  9:38 PM      Component Value Range Comment   aPTT 28  24 - 37 (seconds)   LIPASE, BLOOD     Status: Normal   Collection Time   04/28/11  9:38 PM      Component Value Range Comment   Lipase 32  11 - 59 (U/L)    Dg Chest 2 View  04/28/2011  *RADIOLOGY REPORT*  Clinical Data: Chest pain, fever, syncope.  Smoker.  CHEST - 2 VIEW  Comparison: None.  Findings: The heart size and pulmonary vascularity are normal. The lungs appear clear and expanded without focal air space disease or consolidation. No blunting of the costophrenic angles.  No pneumothorax.  Probable old right rib fracture.  Mild degenerative changes in the spine.  IMPRESSION: No evidence of active pulmonary disease.  Original Report Authenticated By: Marlon Pel, M.D.      Assessment Present on Admission:   .HYPERLIPIDEMIA .GLUCOSE INTOLERANCE .Syncope and collapse   PLAN:  This gentleman likely had a vasovagal syncope. Having said that he did have some chest tightness along with elevated CPKs and MB fractions.   We'll admit him for rule out. I will get a cardiac echo as well.  Will start him on an aspirin a day. He is stable otherwise and will be monitor for any arrhythmia.  He has history of glucose intolerance and is possible that he was hypoglycemic also. I note that he has borderline hyponatremia. This will be followed closely.  He does drink some alcohol but not excessively.  He is stable, full code, and will be admitted to [facility] hospitalist service.   Other plans as per orders.    Ananya Mccleese 04/29/2011, 2:04 AM

## 2011-04-29 NOTE — ED Notes (Signed)
Echo cardiogram done.

## 2011-04-29 NOTE — ED Notes (Signed)
Pt requesting to leave AMA again because he has got a bed yet. Dr. Nedra Hai paged.

## 2011-04-29 NOTE — Consults (Signed)
Please see dictated notes # [phone]

## 2011-04-29 NOTE — ED Notes (Signed)
MD at bedside. Dr Nedra Hai with admitting seeing pt

## 2011-04-29 NOTE — ED Notes (Signed)
Pt alert and oriented x4. Respirations even and unlabored, bilateral symmetrical rise and fall of chest. Skin warm and dry. In no acute distress. Denies needs. Selena Batten, RN admission nurse was in room.

## 2011-04-29 NOTE — Progress Notes (Signed)
  Echocardiogram 2D Echocardiogram has been performed.  Jorje [facility] 04/29/2011, 8:27 AM

## 2011-04-29 NOTE — ED Notes (Signed)
Pt said he was ready to go home and sign out ama. Explained to pt it was in his best interest to stay and that we were awaiting on a bed assignment. Pt agreed to stay.

## 2011-04-29 NOTE — ED Notes (Signed)
Report given to [facility]

## 2011-04-29 NOTE — Progress Notes (Signed)
*  PRELIMINARY RESULTS* Carotid dopplers performed. Preliminary findings showed no ICA stenosis. Antegrade vertebral artery flow bilateral.  Samuel Hawkins 04/29/2011, 3:21 PM

## 2011-04-29 NOTE — ED Notes (Signed)
Pt now agrees to stay and not leave AMA!!!

## 2011-04-30 ENCOUNTER — Other Ambulatory Visit: Payer: Self-pay

## 2011-04-30 ENCOUNTER — Encounter (HOSPITAL_COMMUNITY): Payer: Self-pay | Admitting: Cardiology

## 2011-04-30 ENCOUNTER — Encounter (HOSPITAL_COMMUNITY)
Admission: EM | Disposition: A | Discharge status: Home / Self Care | Payer: Self-pay | Source: Home / Self Care | Attending: Thoracic Surgery (Cardiothoracic Vascular Surgery)

## 2011-04-30 DIAGNOSIS — I2510 Atherosclerotic heart disease of native coronary artery without angina pectoris: Secondary | ICD-10-CM

## 2011-04-30 DIAGNOSIS — Z01810 Encounter for preprocedural cardiovascular examination: Secondary | ICD-10-CM

## 2011-04-30 HISTORY — PX: LEFT HEART CATHETERIZATION WITH CORONARY ANGIOGRAM: SHX5451

## 2011-04-30 LAB — BASIC METABOLIC PANEL
Chloride: 104 mEq/L (ref 96–112)
GFR calc Af Amer: >90 mL/min (ref >90)
GFR calc non Af Amer: >90 mL/min (ref >90)
Glucose, Bld: 90 mg/dL (ref 70–99)
Potassium: 3.4 mEq/L — ABNORMAL LOW (ref 3.5–5.1)
Sodium: 134 mEq/L — ABNORMAL LOW (ref 135–145)

## 2011-04-30 LAB — CARDIAC PANEL(CRET KIN+CKTOT+MB+TROPI)
CK, MB: 3.2 ng/mL (ref 0.3–4.0)
Relative Index: 2.1 (ref 0.0–2.5)
Total CK: 87 U/L (ref 7–232)
Troponin I: <0.3 ng/mL (ref <0.30)

## 2011-04-30 LAB — CBC
HCT: 39.3 % (ref 39.0–52.0)
Hemoglobin: 13.9 g/dL (ref 13.0–17.0)
RBC: 4.52 MIL/uL (ref 4.22–5.81)

## 2011-04-30 SURGERY — LEFT HEART CATHETERIZATION WITH CORONARY ANGIOGRAM
Anesthesia: LOCAL

## 2011-04-30 MED ORDER — ATROPINE SULFATE 0.1 MG/ML IJ SOLN: 1.0000 mg | Freq: Once | INTRAMUSCULAR | Status: DC

## 2011-04-30 MED ORDER — POTASSIUM CHLORIDE CRYS ER 20 MEQ PO TBCR
20.0000 meq | EXTENDED_RELEASE_TABLET | Freq: Two times a day (BID) | ORAL | Status: AC
  Administered 2011-04-30 (×2): 20 meq via ORAL
  Filled 2011-04-30: qty 1

## 2011-04-30 MED ORDER — ROSUVASTATIN CALCIUM 40 MG PO TABS
40.0000 mg | ORAL_TABLET | Freq: Every day | ORAL | Status: DC
  Administered 2011-04-30 – 2011-05-03 (×4): 40 mg via ORAL
  Filled 2011-04-30 (×5): qty 1

## 2011-04-30 MED ORDER — SODIUM CHLORIDE 0.9 % IV SOLN
INTRAVENOUS | Status: DC
  Administered 2011-04-30: 10 mL/h via INTRAVENOUS

## 2011-04-30 MED ORDER — PANTOPRAZOLE SODIUM 40 MG PO TBEC
40.0000 mg | DELAYED_RELEASE_TABLET | Freq: Two times a day (BID) | ORAL | Status: DC
  Administered 2011-04-30 – 2011-05-03 (×7): 40 mg via ORAL
  Filled 2011-04-30 (×7): qty 1

## 2011-04-30 MED ORDER — ONDANSETRON HCL 4 MG/2ML IJ SOLN
INTRAMUSCULAR | Status: AC
  Administered 2011-04-30: 4 mg via NASAL
  Filled 2011-04-30: qty 2

## 2011-04-30 MED ORDER — ASPIRIN 81 MG PO CHEW
81.0000 mg | CHEWABLE_TABLET | Freq: Every day | ORAL | Status: DC
  Administered 2011-05-01 – 2011-05-03 (×3): 81 mg via ORAL
  Filled 2011-04-30 (×3): qty 1

## 2011-04-30 MED ORDER — ONDANSETRON HCL 4 MG/2ML IJ SOLN
4.0000 mg | Freq: Four times a day (QID) | INTRAMUSCULAR | Status: DC | PRN
  Administered 2011-05-02 (×2): 4 mg via INTRAVENOUS
  Filled 2011-04-30 (×2): qty 2

## 2011-04-30 MED ORDER — ALUM & MAG HYDROXIDE-SIMETH 200-200-20 MG/5ML PO SUSP
30.0000 mL | Freq: Four times a day (QID) | ORAL | Status: DC | PRN
  Administered 2011-04-30: 30 mL via ORAL
  Filled 2011-04-30: qty 30

## 2011-04-30 MED ORDER — ADENOSINE 12 MG/4ML IV SOLN
16.0000 mL | Freq: Once | INTRAVENOUS | Status: DC
  Filled 2011-04-30: qty 16

## 2011-04-30 MED ORDER — EPTIFIBATIDE 75 MG/100ML IV SOLN
INTRAVENOUS | Status: AC
  Administered 2011-04-30: 2 ug/kg/min via INTRAVENOUS
  Filled 2011-04-30: qty 100

## 2011-04-30 MED ORDER — ENOXAPARIN SODIUM 80 MG/0.8ML ~~LOC~~ SOLN
80.0000 mg | Freq: Two times a day (BID) | SUBCUTANEOUS | Status: DC
Start: 2011-05-01 — End: 2011-05-01
  Administered 2011-05-01: 80 mg via SUBCUTANEOUS
  Filled 2011-04-30 (×3): qty 0.8

## 2011-04-30 MED ORDER — ASPIRIN 81 MG PO CHEW
81.0000 mg | CHEWABLE_TABLET | Freq: Once | ORAL | Status: AC
  Administered 2011-04-30: 81 mg via ORAL
  Filled 2011-04-30: qty 1

## 2011-04-30 MED ORDER — ONDANSETRON HCL 4 MG/2ML IJ SOLN: 4.0000 mg | Freq: Four times a day (QID) | INTRAMUSCULAR | Status: DC | PRN

## 2011-04-30 MED ORDER — DOPAMINE-DEXTROSE 3.2-5 MG/ML-% IV SOLN: 2.0000 ug/kg/min | INTRAVENOUS | Status: DC

## 2011-04-30 MED ORDER — POTASSIUM CHLORIDE CRYS ER 20 MEQ PO TBCR
EXTENDED_RELEASE_TABLET | ORAL | Status: AC
  Filled 2011-04-30: qty 1

## 2011-04-30 MED ORDER — DOPAMINE-DEXTROSE 3.2-5 MG/ML-% IV SOLN
2.0000 ug/kg/min | INTRAVENOUS | Status: DC
  Administered 2011-04-30: 3 ug/kg/min via INTRAVENOUS
  Administered 2011-05-01: 4.996 ug/kg/min via INTRAVENOUS
  Administered 2011-05-03: 5 ug/kg/min via INTRAVENOUS
  Filled 2011-04-30 (×2): qty 250

## 2011-04-30 MED ORDER — DIAZEPAM 2 MG PO TABS
2.0000 mg | ORAL_TABLET | ORAL | Status: DC | PRN
  Administered 2011-05-03 – 2011-05-04 (×2): 2 mg via ORAL
  Filled 2011-04-30 (×2): qty 1

## 2011-04-30 MED ORDER — SODIUM CHLORIDE 0.9 % IV SOLN
INTRAVENOUS | Status: AC
  Administered 2011-04-30: 20:00:00 via INTRAVENOUS

## 2011-04-30 MED ORDER — DOPAMINE-DEXTROSE 3.2-5 MG/ML-% IV SOLN
INTRAVENOUS | Status: AC
  Filled 2011-04-30: qty 250

## 2011-04-30 MED ORDER — SODIUM CHLORIDE 0.9 % IV SOLN: INTRAVENOUS | Status: AC

## 2011-04-30 MED ORDER — EPTIFIBATIDE 75 MG/100ML IV SOLN
2.0000 ug/kg/min | INTRAVENOUS | Status: AC
  Administered 2011-04-30 – 2011-05-01 (×2): 2 ug/kg/min via INTRAVENOUS
  Filled 2011-04-30 (×3): qty 100

## 2011-04-30 MED ORDER — ATROPINE SULFATE 1 MG/ML IJ SOLN
INTRAMUSCULAR | Status: AC
  Filled 2011-04-30: qty 1

## 2011-04-30 NOTE — Progress Notes (Signed)
Patient was not seen.He was transferred to [facility] cone cath lab today and underwent cardiac cath by Dr Jacinto Halim.Spoke to Dr Jacinto Halim who stated that he will follow up the patient today as an attending and requested the hospitalist to pick up his care tomorrow.I spoke to the flow manager and patient was assigned to [facility]  .Dr Waymon Amato was informed and he will kindly follow up the patient in the AM.

## 2011-04-30 NOTE — Brief Op Note (Addendum)
04/28/2011 - 04/30/2011  1:16 PM  PATIENT:  Samuel Hawkins  55 y.o. male  PRE-OPERATIVE DIAGNOSIS:  chest pain, [place], Syncope.   POST-OPERATIVE DIAGNOSIS:  Severe 3 vessel CAD.  PROCEDURE:  Procedure(s): LEFT HEART CATHETERIZATION WITH CORONARY ANGIOGRAM, FFR of LAD. Significant 7.3.   SURGEON:  Surgeon(s): Pamella Pert Dictation # [phone] [phone]

## 2011-04-30 NOTE — Progress Notes (Signed)
ANTICOAGULATION CONSULT NOTE - Initial Consult  Pharmacy Consult for Lovenox/Integrilin Indication: chest pain/ACS  Allergies  Allergen Reactions  . Atorvastatin Other (See Comments)    REACTION: muscle cramp  . Simvastatin Other (See Comments)    REACTION: muscle cramp    Patient Measurements: Height: 5\' 7"  (170.2 cm) Weight: 175 lb 14.4 oz (79.788 kg) (standing scale) IBW/kg (Calculated) : 66.1   Vital Signs: Temp: 98.2 F (36.8 C) (12/13 1700) Temp src: Oral (12/13 1700) BP: 108/70 mmHg (12/13 1007) Pulse Rate: 44  (12/13 1134)  Labs:  Basename 04/30/11 1619 04/30/11 0001 04/29/11 1605 04/29/11 1601 04/29/11 0510 04/28/11 2138  HGB -- 13.9 -- -- 13.4 --  HCT -- 39.3 -- -- 39.5 42.0  PLT -- 264 -- -- 277 292  APTT -- -- -- -- -- 28  LABPROT -- -- -- -- -- 13.7  INR -- -- -- -- -- 1.03  HEPARINUNFRC -- -- -- -- -- --  CREATININE -- 0.90 0.83 -- -- 1.20  CKTOTAL 87 240* -- 307* -- --  CKMB 3.2 5.1* -- 5.9* -- --  TROPONINI <0.30 <0.30 -- <0.30 -- --   Estimated Creatinine Clearance: 96.1 ml/min (by C-G formula based on Cr of 0.9).  Medical History: Past Medical History  Diagnosis Date  . Hyperlipidemia   . History of erectile dysfunction   . Glucose intolerance (impaired glucose tolerance)     history of    Assessment: 55 YOM admitted for chest pain, s/p Cath, with 3 vessel CAD. Integrilin infusion started at cath lab at 1345 to continue for total of 18 hours, and to start lovenox for anticoagulation. Pt. weight 80kg, est. crcl 96, hgb 13.9, plt 264.   Plan:  1. Continue Integrilin 14mcg/kg/hr unitl 0745 on 12/14 2. Start Lovenox 80mg  sq q12hrs, first dose 0300 at 12/14. 3. Well f/u cbc in am.  Riki Rusk 04/30/2011,5:41 PM

## 2011-04-30 NOTE — Progress Notes (Signed)
Pre CABG Dopplers completed at 17:55.  Preliminary report is ABI is >1.0 bilaterally with abnormal Doppler waveforms noted in the anterior tibial artery. Smiley Houseman 04/30/2011, 6:11 PM

## 2011-04-30 NOTE — Progress Notes (Signed)
Patient complains of nausea and "burning" in his throat. Patient proceeded to vomit a large amount of light brown emesis and now states that he feels better. HR remains bradycardic with SBP in the 80's on of dopamine. Patient denies chest pain. EKG obtained with no acute changes since previous one. Dr. Jacinto Halim notified and orders received to administer zofran and protonix and EKG faxed to him for further review. No additional orders received at this time. Will continue to monitor.  Blair Heys [facility] 04/30/2011 Late entry for 2000

## 2011-04-30 NOTE — Consult Note (Signed)
Reason for Consult:3 vessel CAD with unstable angina Referring Physician: Dr. [name] is an 55 y.o. male.  HPI: 55 yo male with history of hyperlipidemia and a strong family history of CAD presents with new onset chest pain. In usual state of good health until 12/10 when he had severe "heartburn" lasting ~ 8 hours. Ultimately resolved, but next morning at work had chest tightness and experienced LOC "for about 30 seconds". Admitted for CP and syncope. Cath today found to have severe 3 vessel CAD, mildly depressed LV function. Currently pain free.  Past Medical History  Diagnosis Date  . Hyperlipidemia   . History of erectile dysfunction   . Glucose intolerance (impaired glucose tolerance)     history of    History reviewed. No pertinent past surgical history.  Family history; Brother died of MI at age 11  Social History:  reports that he has been smoking Cigarettes.  He has never used smokeless tobacco. He reports that he drinks alcohol. He reports that he does not use illicit drugs.Reports only "social smoking" never a pack a day or every day smoker  Allergies:  Allergies  Allergen Reactions  . Atorvastatin Other (See Comments)    REACTION: muscle cramp  . Simvastatin Other (See Comments)    REACTION: muscle cramp    Medications:  Prior to Admission:  No prescriptions prior to admission    Results for orders placed during the hospital encounter of 04/28/11 (from the past 48 hour(s))  CBC     Status: Abnormal   Collection Time   04/28/11  9:38 PM      Component Value Range Comment   WBC 7.3  4.0 - 10.5 (K/uL)    RBC 4.87  4.22 - 5.81 (MIL/uL)    Hemoglobin 15.4  13.0 - 17.0 (g/dL)    HCT 16.1  09.6 - 04.5 (%)    MCV 86.2  78.0 - 100.0 (fL)    MCH 31.6  26.0 - 34.0 (pg)    MCHC 36.7 (*) 30.0 - 36.0 (g/dL)    RDW 40.9  81.1 - 91.4 (%)    Platelets 292  150 - 400 (K/uL)   CARDIAC PANEL(CRET KIN+CKTOT+MB+TROPI)     Status: Abnormal   Collection Time   04/28/11   9:38 PM      Component Value Range Comment   Total CK 442 (*) 7 - 232 (U/L)    CK, MB 5.9 (*) 0.3 - 4.0 (ng/mL)    Troponin I <0.30  <0.30 (ng/mL)    Relative Index 1.3  0.0 - 2.5    COMPREHENSIVE METABOLIC PANEL     Status: Abnormal   Collection Time   04/28/11  9:38 PM      Component Value Range Comment   Sodium 133 (*) 135 - 145 (mEq/L)    Potassium 3.6  3.5 - 5.1 (mEq/L)    Chloride 101  96 - 112 (mEq/L)    CO2 22  19 - 32 (mEq/L)    Glucose, Bld 93  70 - 99 (mg/dL)    BUN 19  6 - 23 (mg/dL)    Creatinine, Ser 7.82  0.50 - 1.35 (mg/dL)    Calcium 9.2  8.4 - 10.5 (mg/dL)    Total Protein 7.8  6.0 - 8.3 (g/dL)    Albumin 3.7  3.5 - 5.2 (g/dL)    AST 28  0 - 37 (U/L)    ALT 24  0 - 53 (U/L)    Alkaline  Phosphatase 70  39 - 117 (U/L)    Total Bilirubin 0.3  0.3 - 1.2 (mg/dL)    GFR calc non Af Amer 67 (*) >90 (mL/min)    GFR calc Af Amer 78 (*) >90 (mL/min)   PROTIME-INR     Status: Normal   Collection Time   04/28/11  9:38 PM      Component Value Range Comment   Prothrombin Time 13.7  11.6 - 15.2 (seconds)    INR 1.03  0.00 - 1.49    APTT     Status: Normal   Collection Time   04/28/11  9:38 PM      Component Value Range Comment   aPTT 28  24 - 37 (seconds)   LIPASE, BLOOD     Status: Normal   Collection Time   04/28/11  9:38 PM      Component Value Range Comment   Lipase 32  11 - 59 (U/L)   CBC     Status: Normal   Collection Time   04/29/11  5:10 AM      Component Value Range Comment   WBC 7.1  4.0 - 10.5 (K/uL)    RBC 4.53  4.22 - 5.81 (MIL/uL)    Hemoglobin 13.4  13.0 - 17.0 (g/dL)    HCT 16.1  09.6 - 04.5 (%)    MCV 87.2  78.0 - 100.0 (fL)    MCH 29.6  26.0 - 34.0 (pg)    MCHC 33.9  30.0 - 36.0 (g/dL)    RDW 40.9  81.1 - 91.4 (%)    Platelets 277  150 - 400 (K/uL)   TSH     Status: Normal   Collection Time   04/29/11  5:10 AM      Component Value Range Comment   TSH 1.444  0.350 - 4.500 (uIU/mL)   HEMOGLOBIN A1C     Status: Normal   Collection Time     04/29/11  5:10 AM      Component Value Range Comment   Hemoglobin A1C 5.5  <5.7 (%)    Mean Plasma Glucose 111  <117 (mg/dL)   CARDIAC PANEL(CRET KIN+CKTOT+MB+TROPI)     Status: Abnormal   Collection Time   04/29/11  8:38 AM      Component Value Range Comment   Total CK 346 (*) 7 - 232 (U/L)    CK, MB 6.2 (*) 0.3 - 4.0 (ng/mL)    Troponin I <0.30  <0.30 (ng/mL)    Relative Index 1.8  0.0 - 2.5    CARDIAC PANEL(CRET KIN+CKTOT+MB+TROPI)     Status: Abnormal   Collection Time   04/29/11  4:01 PM      Component Value Range Comment   Total CK 307 (*) 7 - 232 (U/L)    CK, MB 5.9 (*) 0.3 - 4.0 (ng/mL)    Troponin I <0.30  <0.30 (ng/mL)    Relative Index 1.9  0.0 - 2.5    CREATININE, SERUM     Status: Normal   Collection Time   04/29/11  4:05 PM      Component Value Range Comment   Creatinine, Ser 0.83  0.50 - 1.35 (mg/dL)    GFR calc non Af Amer >90  >90 (mL/min)    GFR calc Af Amer >90  >90 (mL/min)   BASIC METABOLIC PANEL     Status: Abnormal   Collection Time   04/30/11 12:01 AM      Component Value Range Comment  Sodium 134 (*) 135 - 145 (mEq/L)    Potassium 3.4 (*) 3.5 - 5.1 (mEq/L)    Chloride 104  96 - 112 (mEq/L)    CO2 23  19 - 32 (mEq/L)    Glucose, Bld 90  70 - 99 (mg/dL)    BUN 12  6 - 23 (mg/dL)    Creatinine, Ser 4.09  0.50 - 1.35 (mg/dL)    Calcium 7.9 (*) 8.4 - 10.5 (mg/dL)    GFR calc non Af Amer >90  >90 (mL/min)    GFR calc Af Amer >90  >90 (mL/min)   CBC     Status: Normal   Collection Time   04/30/11 12:01 AM      Component Value Range Comment   WBC 8.1  4.0 - 10.5 (K/uL)    RBC 4.52  4.22 - 5.81 (MIL/uL)    Hemoglobin 13.9  13.0 - 17.0 (g/dL)    HCT 81.1  91.4 - 78.2 (%)    MCV 86.9  78.0 - 100.0 (fL)    MCH 30.8  26.0 - 34.0 (pg)    MCHC 35.4  30.0 - 36.0 (g/dL)    RDW 95.6  21.3 - 08.6 (%)    Platelets 264  150 - 400 (K/uL)   CARDIAC PANEL(CRET KIN+CKTOT+MB+TROPI)     Status: Abnormal   Collection Time   04/30/11 12:01 AM      Component  Value Range Comment   Total CK 240 (*) 7 - 232 (U/L)    CK, MB 5.1 (*) 0.3 - 4.0 (ng/mL)    Troponin I <0.30  <0.30 (ng/mL)    Relative Index 2.1  0.0 - 2.5    POCT ACTIVATED CLOTTING TIME     Status: Normal   Collection Time   04/30/11 12:22 PM      Component Value Range Comment   Activated Clotting Time 402     CARDIAC PANEL(CRET KIN+CKTOT+MB+TROPI)     Status: Normal   Collection Time   04/30/11  4:19 PM      Component Value Range Comment   Total CK 87  7 - 232 (U/L)    CK, MB 3.2  0.3 - 4.0 (ng/mL)    Troponin I <0.30  <0.30 (ng/mL)    Relative Index RELATIVE INDEX IS INVALID  0.0 - 2.5      Dg Chest 2 View  04/28/2011  *RADIOLOGY REPORT*  Clinical Data: Chest pain, fever, syncope.  Smoker.  CHEST - 2 VIEW  Comparison: None.  Findings: The heart size and pulmonary vascularity are normal. The lungs appear clear and expanded without focal air space disease or consolidation. No blunting of the costophrenic angles.  No pneumothorax.  Probable old right rib fracture.  Mild degenerative changes in the spine.  IMPRESSION: No evidence of active pulmonary disease.  Original Report Authenticated By: Marlon Pel, M.D.    Review of Systems  Constitutional: Negative for fever, chills, weight loss and malaise/fatigue.  HENT: Negative.   Eyes: Negative.   Respiratory: Negative.   Cardiovascular: Positive for chest pain. Negative for palpitations, orthopnea, claudication, leg swelling and PND.  Gastrointestinal: Positive for heartburn. Negative for nausea and vomiting.  Genitourinary: Negative.   Musculoskeletal: Negative.   Skin: Negative.   Neurological: Positive for loss of consciousness. Negative for dizziness, tremors, speech change, focal weakness, seizures and weakness.  Endo/Heme/Allergies: Negative.   All other systems reviewed and are negative.   Blood pressure 120/61, pulse 47, temperature 98.2 F (36.8 C),  temperature source Oral, resp. rate 19, height 5\' 7"  (1.702 m),  weight 79.788 kg (175 lb 14.4 oz), SpO2 100.00%. Physical Exam  Vitals reviewed. Constitutional: He is oriented to person, place, and time. He appears well-developed and well-nourished. No distress.  HENT:  Head: Normocephalic and atraumatic.  Eyes: EOM are normal. Pupils are equal, round, and reactive to light.  Neck: Neck supple. No JVD present. No tracheal deviation present. No thyromegaly present.  Cardiovascular: Normal rate, regular rhythm and normal heart sounds.  Exam reveals no gallop and no friction rub.   No murmur heard. Respiratory: Effort normal and breath sounds normal. He has no wheezes. He has no rales.  GI: Soft. Bowel sounds are normal. There is no tenderness.  Musculoskeletal: Normal range of motion.  Lymphadenopathy:    He has no cervical adenopathy.  Neurological: He is alert and oriented to person, place, and time.  Skin: Skin is warm and dry.  Psychiatric: He has a normal mood and affect.    Assessment/Plan: 55 yo male with hyperlipidemia and a family history of CAD presents with unstable angina and is found to have 3 vessel CAD. CABG indicated for survival benefit and relief of symptoms.  I have discussed with him the general nature of the procedure, need for general anesthesia,and incisions to be used. I have discussed the expected hospital stay, overall recovery and short and long term outcomes. He understands the risks include but are not limited to death, stroke, MI, DVT/PE, bleeding, possible need for transfusion, infections,other organ system dysfunction including respiratory, renal, or GI complications.he understands CABG is palliative and not curative and that he likely will need additional procedures in the future. He understands and accepst these risks and agrees to proceed.  Plan OR 1st case Monday 12/17 D/C integrilin 0200 12/17 Needs preop carotid duplex and PFTs  Vicktoria Muckey C 04/30/2011, 6:53 PM

## 2011-04-30 NOTE — Consult Note (Signed)
[name], Samuel                 ACCOUNT NO.:  [id number]  MEDICAL RECORD NO.:  [id number]  LOCATION:  1413                         FACILITY:  [facility]  PHYSICIAN:  Pamella Pert, MD DATE OF BIRTH:  1956/02/11  DATE OF CONSULTATION:  04/29/2011 DATE OF DISCHARGE:                                CONSULTATION   REASON FOR CONSULTATION:  Chest pain, please evaluate.  Syncope, please evaluate.  PRIMARY CARE PHYSICIAN:  Samuel Levins, MD  HISTORY:  [name] is a 55 year old gentleman with longstanding history of hyperlipidemia and a strong family history of premature coronary artery disease.  He had been doing well until Sunday night, that is 2 days ago on April 27, 2011.  He developed severe burning sensation in his chest that he thought it is his GERD.  This lasted all night, has taken about 8 to 10 Tums without any significant relief. Next morning, he did not feel well.  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  He pulled truck over to an empty lot and then had an episode of syncope.  He has a small ecchymosis on his head and also has an impact ulceration in the cheek.  He states that this could happen only about 20 to 30 seconds, then he woke up as well.  Since then he did not have any chest pain, no shortness of breath, no palpitations.  He felt well and delivered goods at 3 other places, but his trucking company called him back stating that he should not be driving.  At the insistence of his family, he has now presented to the emergency room for evaluation.  He was admitted.  He was ruled out for myocardial infarction.  Admitted under telemetry and I was asked to evaluate him.  Presently, patient states that he is totally asymptomatic.  He did have some diarrhea yesterday he attributes this to the food that he had  eaten on Sunday that caused him to have GERD.  He has occasional GERD in some form of burning sensation in the chest.  But he has never had severe burning sensation like he had 3 days ago.  There is no bowel or bladder disturbances, otherwise.  He is not a diabetic.  There is no recent weight gain or weight loss.  There is no TIA, no neurological deficits. He is not a diabetic.  Other systems are negative.  HOME MEDICATIONS:  None.  ALLERGIES:  No known drug allergies, although he is intolerant to ZOCOR and also LIPITOR due to muscle cramping.  SOCIAL HISTORY:  He is single, lives with his girlfriend and daughter. He does not smoke.  He drinks alcohol occasionally.  Smokes cigarettes rarely.  No history of illicit drug use.  FAMILY HISTORY:  Strongly positive for premature coronary artery disease in his brother, who died at the age of 47 with myocardial infarction.  PHYSICAL EXAMINATION:  GENERAL:  He is well built, well nourished.  He appears to be in no acute distress.  VITAL SIGNS:  Include, temperature of 97.8, pulse is 52 beats per minute regular, respirations 16, blood pressure 100/62 mmHg. CARDIAC EXAM:  S1, S2 is normal.  There is a 2/6 systolic ejection murmur heard in the aortic area and apex.  There is no gallop appreciated. CHEST EXAMINATION:  Clear without any rhonchi.  ABDOMEN:  Soft, nontender without any edema. PERIPHERAL ARTERIAL EXAMINATION:  Normal.  PERTINENT FINDINGS:  His CBC was within normal limits.  CMP was within normal limits except for mild decrease in his sodium at 133, which is not significant.  His cardiac markers have been negative x3 for myocardial injury.  His troponin were less than 0.30.  His total CK was elevated, however, the CK-MB was normal with a normal index.  His TSH was within normal limits.  Total cholesterol was 224, triglycerides 164, HDL 26, LDL was 176.  His EKG demonstrated old inferior wall myocardial infarction without  any evidence of ischemia.  His 2D echocardiogram that was done today revealed normal LV systolic function with ejection fraction of 55% to 60%.  There was mild posterior wall hypokinesis and mild grade 1 diastolic dysfunction without any significant valvular abnormalities. There was no significant valvular abnormalities.  IMPRESSION: 1. Syncope probably vasovagal syncope, however, cardiac etiology     cannot be completely excluded. 2. Abnormal EKG suggestive of old inferior wall myocardial infarction     and also the echocardiogram reveals mild posterior wall     hypokinesis. 3. Chest pain appears to be atypical, however, I cannot exclude acute     coronary syndrome.  The cardiac markers have been negative for     myocardial injury.  Chest pain, suggestive of unstable angina,     probably gastroesophageal reflux disease, but cannot exclude     unstable angina. 4. Hyperlipidemia. 5. Strong family history of premature coronary artery disease in a     brother, who died at the age of 13 with myocardial infarction.  RECOMMENDATIONS: 1. Patient is extremely high-risk individual for coronary artery     disease.  I would recommend that he proceed with cardiac     catheterization.  He understands less than a percent risk of death,     stroke, heart attack, and need for urgent bypass surgery, but not     limited to these.  He also understands evaluation by noninvasive     methods including cardiac stress testing.  The patient prefers to     have cardiac catheterization, which also would feel is probably     more appropriate for this gentleman, who is extremely high-risk     individual.  I will set this up for tomorrow and make further     recommendations. 2. As far as lipids are concerned, we can certainly try Crestor. 3. I will also start him on a very low dose of a beta-blocker.     Although he will start bradycardia, he appears to be     fit and I am sure that he will be able to  tolerate.  At this point,     unstable angina should be ruled out in the differential diagnosis.     I will make further recommendations.  Also, we will start cardiac     catheterization tomorrow.     Pamella Pert, MD     JRG/MEDQ  D:  04/29/2011  T:  04/30/2011  Job:  [id number]  cc:   Samuel Levins, MD [address] [facility] [place]  [place] [address]

## 2011-05-01 ENCOUNTER — Other Ambulatory Visit: Payer: Self-pay

## 2011-05-01 ENCOUNTER — Inpatient Hospital Stay (HOSPITAL_COMMUNITY): Payer: Managed Care, Other (non HMO)

## 2011-05-01 DIAGNOSIS — I498 Other specified cardiac arrhythmias: Secondary | ICD-10-CM

## 2011-05-01 DIAGNOSIS — I959 Hypotension, unspecified: Secondary | ICD-10-CM

## 2011-05-01 DIAGNOSIS — R55 Syncope and collapse: Secondary | ICD-10-CM

## 2011-05-01 DIAGNOSIS — I200 Unstable angina: Secondary | ICD-10-CM

## 2011-05-01 LAB — CARDIAC PANEL(CRET KIN+CKTOT+MB+TROPI)
CK, MB: 4.5 ng/mL — ABNORMAL HIGH (ref 0.3–4.0)
Relative Index: 3.3 — ABNORMAL HIGH (ref 0.0–2.5)
Total CK: 137 U/L (ref 7–232)
Troponin I: <0.3 ng/mL (ref <0.30)

## 2011-05-01 LAB — CBC
MCH: 30.9 pg (ref 26.0–34.0)
MCHC: 36.1 g/dL — ABNORMAL HIGH (ref 30.0–36.0)
MCV: 85.6 fL (ref 78.0–100.0)
Platelets: 278 10*3/uL (ref 150–400)
RDW: 12.1 % (ref 11.5–15.5)
WBC: 7.8 10*3/uL (ref 4.0–10.5)

## 2011-05-01 LAB — BASIC METABOLIC PANEL
CO2: 24 mEq/L (ref 19–32)
Calcium: 7.7 mg/dL — ABNORMAL LOW (ref 8.4–10.5)
Creatinine, Ser: 0.81 mg/dL (ref 0.50–1.35)

## 2011-05-01 LAB — LIPID PANEL
Cholesterol: 116 mg/dL (ref 0–200)
Total CHOL/HDL Ratio: 6.4 RATIO

## 2011-05-01 LAB — HEMOGLOBIN A1C: Mean Plasma Glucose: 114 mg/dL (ref <117)

## 2011-05-01 LAB — URINE MICROSCOPIC-ADD ON

## 2011-05-01 LAB — URINALYSIS, ROUTINE W REFLEX MICROSCOPIC
Glucose, UA: NEGATIVE mg/dL
Ketones, ur: 15 mg/dL — AB
Protein, ur: NEGATIVE mg/dL

## 2011-05-01 LAB — POCT I-STAT 3, ART BLOOD GAS (G3+)
Acid-base deficit: 3 mmol/L — ABNORMAL HIGH (ref 0.0–2.0)
Bicarbonate: 21.4 mEq/L (ref 20.0–24.0)
O2 Saturation: 94 %
pO2, Arterial: 72 mmHg — ABNORMAL LOW (ref 80.0–100.0)

## 2011-05-01 LAB — PLATELET COUNT: Platelets: 282 10*3/uL (ref 150–400)

## 2011-05-01 LAB — TYPE AND SCREEN

## 2011-05-01 MED ORDER — ZOLPIDEM TARTRATE 5 MG PO TABS
5.0000 mg | ORAL_TABLET | Freq: Every evening | ORAL | Status: DC | PRN
  Administered 2011-05-01 – 2011-05-03 (×4): 5 mg via ORAL
  Filled 2011-05-01 (×4): qty 1

## 2011-05-01 MED ORDER — EPTIFIBATIDE 75 MG/100ML IV SOLN
2.0000 ug/kg/min | INTRAVENOUS | Status: DC
  Administered 2011-05-01 – 2011-05-03 (×9): 2 ug/kg/min via INTRAVENOUS
  Filled 2011-05-01 (×16): qty 100

## 2011-05-01 MED FILL — Midazolam HCl Inj 2 MG/2ML (Base Equivalent): INTRAMUSCULAR | Qty: 2 | Status: AC

## 2011-05-01 MED FILL — Lidocaine HCl Local Preservative Free (PF) Inj 1%: INTRAMUSCULAR | Qty: 30 | Status: AC

## 2011-05-01 MED FILL — Dextrose Inj 5%: INTRAVENOUS | Qty: 50 | Status: AC

## 2011-05-01 MED FILL — Bivalirudin Trifluoroacetate For IV Soln 250 MG (Base Equiv): INTRAVENOUS | Qty: 250 | Status: AC

## 2011-05-01 MED FILL — Nitroglycerin IV Soln 200 MCG/ML in D5W: INTRAVENOUS | Qty: 1 | Status: AC

## 2011-05-01 MED FILL — Heparin Sodium (Porcine) Inj 1000 Unit/ML: INTRAMUSCULAR | Qty: 10 | Status: AC

## 2011-05-01 MED FILL — Heparin Sodium (Porcine) 2 Unit/ML in Sodium Chloride 0.9%: INTRAMUSCULAR | Qty: 1000 | Status: AC

## 2011-05-01 MED FILL — Verapamil HCl IV Soln 2.5 MG/ML: INTRAVENOUS | Qty: 2 | Status: AC

## 2011-05-01 MED FILL — Hydromorphone HCl Preservative Free (PF) Inj 2 MG/ML: INTRAMUSCULAR | Qty: 1 | Status: AC

## 2011-05-01 NOTE — Progress Notes (Signed)
1 Day Post-Op Procedure(s) (LRB): LEFT HEART CATHETERIZATION WITH CORONARY ANGIOGRAM (N/A) Subjective: Some chest discomfort last night- relieved with Maalox On intgrelin and dopamine gtts Anxious but otherwise feels well this AM  Objective: Vital signs in last 24 hours: Temp:  [97.5 F (36.4 C)-98.8 F (37.1 C)] 98.8 F (37.1 C) (12/14 0803) Pulse Rate:  [39-92] 46  (12/14 0900) Cardiac Rhythm:  [-] Sinus bradycardia (12/14 0800) Resp:  [15-23] 19  (12/14 0900) BP: (72-134)/(28-72) 93/56 mmHg (12/14 0900) SpO2:  [93 %-100 %] 94 % (12/14 0900)  Hemodynamic parameters for last 24 hours:    Intake/Output from previous day: 12/13 0701 - 12/14 0700 In: 1247.8 [I.V.:1247.8] Out: 1475 [Urine:1175; Emesis/NG output:300] Intake/Output this shift: Total I/O In: 401.6 [P.O.:350; I.V.:51.6] Out: 425 [Urine:425]  General appearance: alert and no distress Heart: regular rate and rhythm Lungs: clear to auscultation bilaterally  Lab Results:  Basename 05/01/11 0500 05/01/11 04/30/11 0001  WBC 7.8 -- 8.1  HGB 13.7 -- 13.9  HCT 38.0* -- 39.3  PLT 278 282 --   BMET:  Basename 05/01/11 0500 04/30/11 0001  NA 139 134*  K 3.6 3.4*  CL 107 104  CO2 24 23  GLUCOSE 115* 90  BUN 8 12  CREATININE 0.81 0.90  CALCIUM 7.7* 7.9*    PT/INR:  Basename 04/28/11 2138  LABPROT 13.7  INR 1.03   ABG    Component Value Date/Time   PHART 7.384 05/01/2011 0546   HCO3 21.4 05/01/2011 0546   TCO2 22 05/01/2011 0546   ACIDBASEDEF 3.0* 05/01/2011 0546   O2SAT 94.0 05/01/2011 0546   CBG (last 3)  No results found for this basename: GLUCAP:3 in the last 72 hours  Assessment/Plan: S/P Procedure(s) (LRB): LEFT HEART CATHETERIZATION WITH CORONARY ANGIOGRAM (N/A) For CABG Monday 12/17 AM I will be away this weekend, Dr. Laneta Simmers covering PFTs OK   LOS: 3 days    Geneva Pallas C 05/01/2011

## 2011-05-01 NOTE — Consult Note (Signed)
Chief Complaint  Patient presents with  . Chest Pain    HISTORY of PRESENT ILLNESS:  Samuel Hawkins is a 55 y.o. male smoker admitted on 04/28/2011 with Syncope and collapse.    He had brief episode of chest pain, nausea, and diaphoresis.  He presented to [facility] ER.  He had elevated CPK and MB fraction in ER.  He has hx of hyperlipidemia, and family hx of early CAD.  He had old ischemic changes on ECG and Echo.  He was evaluated by Dr. Jacinto Halim, and had cardiac cath which showed severe 3 vessel CAD with mildly depressed LV systolic function.  He was evaluated by Dr. Dorris Fetch from [facility] for CABG, and transferred to [facility].  Since he is residing in ICU, PCCM has been asked to assume primary care from [facility] hospitalist team.  Tests: 12/12 Echo>>EF 55 to 60%, mild hypokinesis posterior wall, grade 1 diastolic dysfx 12/14 PFT>>FEV1 3.10(89%), FEV1% 83  Past Medical History  Diagnosis Date  . Hyperlipidemia   . History of erectile dysfunction   . Glucose intolerance (impaired glucose tolerance)     history of    History reviewed. No pertinent past surgical history.  History reviewed. No pertinent family history.   reports that he has been smoking Cigarettes.  He has never used smokeless tobacco. He reports that he drinks alcohol. He reports that he does not use illicit drugs.  Allergies  Allergen Reactions  . Atorvastatin Other (See Comments)    REACTION: muscle cramp  . Simvastatin Other (See Comments)    REACTION: muscle cramp    Medications Prior to Admission  Medication Dose Route Frequency Provider Last Rate Last Dose  . 0.9 %  sodium chloride infusion  250 mL Intravenous PRN Pamella Pert      . 0.9 %  sodium chloride infusion   Intravenous Continuous Pamella Pert      . 0.9 %  sodium chloride infusion   Intravenous Continuous Harland Dingwall Ganji 150 mL/hr at 04/30/11 1943    . 0.9 %  sodium chloride infusion   Intravenous Continuous Jagadeesh R Ganji 10 mL/hr at 04/30/11 2100  10 mL/hr at 04/30/11 2100  . adenosine (ADENOCARD) 12 MG/4ML injection 48 mg  16 mL Intravenous Once Pamella Pert      . alum & mag hydroxide-simeth (MAALOX/MYLANTA) 200-200-20 MG/5ML suspension 30 mL  30 mL Oral Q6H PRN Jagadeesh R Ganji   30 mL at 04/30/11 2340  . aspirin chewable tablet 324 mg  324 mg Oral Once Felisa Bonier, MD   324 mg at 04/28/11 2157  . aspirin chewable tablet 81 mg  81 mg Oral Once Jagadeesh R Ganji   81 mg at 04/30/11 1007  . aspirin chewable tablet 81 mg  81 mg Oral Daily Jagadeesh R Ganji   81 mg at 05/01/11 1107  . diazepam (VALIUM) tablet 10 mg  10 mg Oral On Call Harland Dingwall Ganji   10 mg at 04/30/11 1033  . diazepam (VALIUM) tablet 2 mg  2 mg Oral Q4H PRN Pamella Pert      . DOPamine (INTROPIN) 800 mg in dextrose 5 % 250 mL infusion  2-5 mcg/kg/min (Order-Specific) Intravenous Titrated Jagadeesh R Ganji 7.4 mL/hr at 05/01/11 0900 5 mcg/kg/min at 05/01/11 0900  . enoxaparin (LOVENOX) injection 80 mg  80 mg Subcutaneous Q12H Riki Rusk, PHARMD   80 mg at 05/01/11 0330  . eptifibatide (INTEGRILIN) 75 mg / 100 mL infusion  2 mcg/kg/min  Intravenous Continuous Riki Rusk, PHARMD 12.8 mL/hr at 05/01/11 0139 2 mcg/kg/min at 05/01/11 0139  . eptifibatide (INTEGRILIN) 75 mg / 100 mL infusion  2 mcg/kg/min Intravenous Continuous Christopher T Elder, PHARMD      . influenza  inactive virus vaccine (FLUZONE/FLUARIX) injection 0.5 mL  0.5 mL Intramuscular Tomorrow-1000 Gagan S Lama      . morphine 2 MG/ML injection 2 mg  2 mg Intravenous Q2H PRN Pamella Pert      . ondansetron (ZOFRAN) 4 MG/2ML injection        4 mg at 04/30/11 2026  . ondansetron (ZOFRAN) injection 4 mg  4 mg Intravenous Q6H PRN Pamella Pert      . pantoprazole (PROTONIX) EC tablet 40 mg  40 mg Oral BID AC Jagadeesh R Ganji   40 mg at 05/01/11 0842  . potassium chloride SA (K-DUR,KLOR-CON) CR tablet 20 mEq  20 mEq Oral BID Tyrone Nine R Ganji   20 mEq at 04/30/11 2200  . rosuvastatin  (CRESTOR) tablet 40 mg  40 mg Oral q1800 Jagadeesh R Ganji   40 mg at 04/30/11 1841  . sodium chloride 0.9 % injection 3 mL  3 mL Intravenous PRN Harland Dingwall Ganji   6 mL at 05/01/11 0848  . zolpidem (AMBIEN) tablet 5 mg  5 mg Oral QHS PRN Harland Dingwall Ganji   5 mg at 05/01/11 0205  . DISCONTD: 0.9 %  sodium chloride infusion  999 mL Intravenous Continuous Felisa Bonier, MD 100 mL/hr at 04/28/11 2159 1,000 mL at 04/28/11 2159  . DISCONTD: 0.9 %  sodium chloride infusion   Intravenous Continuous Peter Le 150 mL/hr at 04/30/11 0640    . DISCONTD: acetaminophen (TYLENOL) suppository 650 mg  650 mg Rectal Q6H PRN Houston Siren      . DISCONTD: acetaminophen (TYLENOL) tablet 650 mg  650 mg Oral Q6H PRN Houston Siren      . DISCONTD: aspirin chewable tablet 324 mg  324 mg Oral Pre-Cath Pamella Pert      . DISCONTD: atropine 0.1 MG/ML injection 1 mg  1 mg Intravenous Once Pamella Pert      . DISCONTD: DOPamine (INTROPIN) 800 mg in dextrose 5 % 250 mL infusion  2-20 mcg/kg/min Intravenous Titrated Pamella Pert      . DISCONTD: enoxaparin (LOVENOX) injection 40 mg  40 mg Subcutaneous QHS Peter Le   40 mg at 04/29/11 2230  . DISCONTD: metoprolol succinate (TOPROL-XL) 24 hr tablet 25 mg  25 mg Oral Daily Jagadeesh R Ganji   25 mg at 04/29/11 2230  . DISCONTD: ondansetron (ZOFRAN) injection 4 mg  4 mg Intravenous Q6H PRN Pamella Pert      . DISCONTD: rosuvastatin (CRESTOR) tablet 10 mg  10 mg Oral Daily Jagadeesh R Ganji   10 mg at 04/30/11 1008  . DISCONTD: zolpidem (AMBIEN) tablet 10 mg  10 mg Oral QHS PRN Houston Siren   10 mg at 04/29/11 2230   No current outpatient prescriptions on file as of 05/01/2011.    ROS: 12 point negative except above  PHYICAL EXAM:  Blood pressure 93/56, pulse 46, temperature 98.8 F (37.1 C), temperature source Oral, resp. rate 19, height 5\' 7"  (1.702 m), weight 175 lb 14.4 oz (79.788 kg), SpO2 94.00%.  General - Obese, no distress HEENT - PERRLA, EOMI, no sinus  tenderness, no oral exudate, no LAN, no thyromegaly Cardiac - s1s2 regular, no murmur, pulses symmetric Chest - no wheeze/rales  Abd - soft, nontender Ext - no edema Skin - no rashes Neuro - normal strength, CN intact Psych - normal mood, behavior  Lab Results  Component Value Date   WBC 7.8 05/01/2011   HGB 13.7 05/01/2011   HCT 38.0* 05/01/2011   MCV 85.6 05/01/2011   PLT 278 05/01/2011  ,  Lab Results  Component Value Date   CREATININE 0.81 05/01/2011   BUN 8 05/01/2011   NA 139 05/01/2011   K 3.6 05/01/2011   CL 107 05/01/2011   CO2 24 05/01/2011  ,  Lab Results  Component Value Date   ALT 24 04/28/2011   AST 28 04/28/2011   ALKPHOS 70 04/28/2011   BILITOT 0.3 04/28/2011  ,  Lab Results  Component Value Date   CKTOTAL 137 05/01/2011   CKMB 4.5* 05/01/2011   TROPONINI <0.30 05/01/2011    Lab Results  Component Value Date   TSH 1.444 04/29/2011   ABG    Component Value Date/Time   PHART 7.384 05/01/2011 0546   HCO3 21.4 05/01/2011 0546   TCO2 22 05/01/2011 0546   ACIDBASEDEF 3.0* 05/01/2011 0546   O2SAT 94.0 05/01/2011 0546   Dg Chest 2 View  04/28/2011  *RADIOLOGY REPORT*  Clinical Data: Chest pain, fever, syncope.  Smoker.  CHEST - 2 VIEW  Comparison: None.  Findings: The heart size and pulmonary vascularity are normal. The lungs appear clear and expanded without focal air space disease or consolidation. No blunting of the costophrenic angles.  No pneumothorax.  Probable old right rib fracture.  Mild degenerative changes in the spine.  IMPRESSION: No evidence of active pulmonary disease.  Original Report Authenticated By: Marlon Pel, M.D.   ASSESSMENT/PLAN:   Chest pain/Syncope/Acute coronary syndrome with severe 3 vessel CAD and mild LV systolic dysfunction -plan for CABG 12/17 per [facility] -no beta blocker due to hypotension, bradycardia  Hypotension with bradycardia -dopamine infusion per cardiology, [facility]  Hyperlipidemia -per  cardiology  Tobacco abuse -counsel about smoking cessation  Impaired glucose tolerance -diet/exercise counseling  Diarrhea -had one episode 12/12, and none since -will d/c order for stool C diff PCR  Irven Ingalsbe Pager:  [phone] 05/01/2011, 11:21 AM

## 2011-05-01 NOTE — Consult Note (Signed)
Pt smokes "a few cigarettes every once in a while". He is in action stage and wants to quit on his own. Discussed risks of even light smoking. Referred to 1-800 quit now for f/u and support. Discussed oral fixation substitutes, second hand smoke and in home smoking policy. Reviewed and gave pt Written education/contact information.

## 2011-05-01 NOTE — Progress Notes (Signed)
Consulted pharmacy regarding patient on continuous Integrilin infusion to be continued until 0200 12/17 per cardiac surgeon and SQ Lovenox. Pharmacy said to give the Lovenox dose now and further evaluation could be done on rounds in AM.  Blair Heys [facility] 05/01/2011 4:01 AM

## 2011-05-01 NOTE — Progress Notes (Signed)
Subjective:  Patient feels better this morning. He had some burning sensation in the chest last night which was relieved with Maalox and was also started on Protonix. This morning is feeling better and denies any chest pain, shortness of breath. His current progress surprisingly does not reveal acute myocardial injury. Objective:  Vital Signs in the last 24 hours: Temp:  [97.5 F (36.4 C)-98.2 F (36.8 C)] 98 F (36.7 C) (12/14 0000) Pulse Rate:  [39-92] 42  (12/14 0600) Resp:  [15-23] 21  (12/14 0600) BP: (72-134)/(28-72) 111/60 mmHg (12/14 0600) SpO2:  [93 %-100 %] 93 % (12/14 0600)  Intake/Output from previous day: 12/13 0701 - 12/14 0700 In: 1187.4 [I.V.:1187.4] Out: 1475 [Urine:1175; Emesis/NG output:300]  Physical Exam:   General appearance: alert, cooperative, appears stated age and mildly obese Eyes: conjunctivae/corneas clear. PERRL, EOM's intact. Fundi benign. Neck: no adenopathy, no carotid bruit, no JVD, supple, symmetrical, trachea midline and thyroid not enlarged, symmetric, no tenderness/mass/nodules Neck: JVP - normal, carotids 2+= without bruits Resp: clear to auscultation bilaterally Chest wall: no tenderness Cardio: S1, S2 normal there is a 2/6 systolic ejection murmur heard in the apex and right parasternal border. GI: soft, non-tender; bowel sounds normal; no masses,  no organomegaly Extremities: extremities normal, atraumatic, no cyanosis or edema    Lab Results:  Basename 05/01/11 0500 05/01/11 04/30/11 0001  WBC 7.8 -- 8.1  HGB 13.7 -- 13.9  PLT 278 282 --    Basename 05/01/11 0500 04/30/11 0001  NA 139 134*  K 3.6 3.4*  CL 107 104  CO2 24 23  GLUCOSE 115* 90  BUN 8 12  CREATININE 0.81 0.90    Basename 05/01/11 0500 04/30/11 1619  TROPONINI <0.30 <0.30   Hepatic Function Panel  Basename 04/28/11 2138  PROT 7.8  ALBUMIN 3.7  AST 28  ALT 24  ALKPHOS 70  BILITOT 0.3  BILIDIR --  IBILI --   No results found for this basename: CHOL  in the last 72 hours No results found for this basename: PROTIME in the last 72 hours  Imaging: Imaging results have been reviewed  Cardiac Studies: Negative cardiac enzymes.   Assessment/Plan:  1. Acute coronary syndrome, unstable angina with evidence of severe triple-vessel coronary disease. Proximal LAD physiologically significant with FFR 0.73. Proximal circumflex 90% stenosis at the origin of a large obtuse marginal 1 which has ostial 90% stenosis. Obtuse marginal 2 which is very large approximately is subtotally occluded with thrombus. Ramus intermediate ostial 80% stenosis. Mid right coronary artery ended distal right carotid artery with a 90% stenosis. Preserved ejection fraction of 55-60%. Echocardiogram revealing posterior wall hypokinesis. 2. Strong family step was to (in brother who died with myocardial infarction at age of 84 3. Hyperlipidemia was not on therapy due to myalgias. No lipid panel done this admission. Will obtain one.  Recommendation: Continue observation. Continue Integrilin 2-4 hours prior to CABG. Appreciate Dr. Dorris Fetch seen the patient. She had chest pain last night in the form of burning sensation and I suspect this is his anginal equivalent. If possible I would like to proceed with CABG sooner if she continues to have chest discomfort but if he remains stable he is scheduled for surgery on Monday morning. He is extremely sensitive to beta blockers and has baseline bradycardia hence I will discontinue all beta blockers. His heart rate at baseline is 40-45 beats per minute and with a very low dose of beta blocker he dropped his blood pressure and was asymptomatic. I suspect  this could be due to 3 vessel coronary disease. He may be able to tolerate a beta blocker after his CABG.   Pamella Pert, M.D. 05/01/2011, 6:22 AM

## 2011-05-01 NOTE — Progress Notes (Signed)
UR Completed.  Mauriana Dann Jane [phone] 05/01/2011  

## 2011-05-01 NOTE — Cardiovascular Report (Addendum)
Samuel Hawkins, Samuel Hawkins                 ACCOUNT NO.:  [id number]  MEDICAL RECORD NO.:  [id number]  LOCATION:  2913                         FACILITY:  [facility]  PHYSICIAN:  Pamella Pert, MD DATE OF BIRTH:  30-Apr-1956  DATE OF PROCEDURE:  04/30/2011 DATE OF DISCHARGE:                           CARDIAC CATHETERIZATION   PROCEDURE PERFORMED: 1. Left ventriculography. 2. Selective right and left coronary arteriography. 3. Fractional flow reserve calculation of the mid left anterior     descending.  INDICATION:  Mr. Lathan Gieselman is a 55 year old gentleman with history of hyperlipidemia and a strong family history of premature coronary artery disease, who was admitted to [facility] on April 28, 2011, with syncope and chest pain.  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  Given this history very suggestive of unstable angina, he was brought to the cardiac catheterization lab to evaluate his coronary anatomy.  HEMODYNAMIC DATA:  The left ventricular pressure was 91/10 with end- diastolic pressure of 21 mmHg.  Aortic pressure was 84/54 with a mean of 67 mmHg.  There was no pressure gradient across the aortic valve.  ANGIOGRAPHIC DATA:  Left ventricle:  Left ventricular systolic function was normal with an ejection fraction of 55-60%.  No significant mitral regurgitation.  Right coronary artery:  Right coronary artery is a dominant vessel.  It is a moderate caliber vessel with mid segment showing diffuse disease and 80% stenosis.  Mid to distal segment before the bifurcation of PD and PL branch showed a 90% stenosis.  Left main coronary artery:  Left main coronary artery is a large-caliber vessel.  It is smooth and normal.  LAD:  LAD is a large-caliber vessel giving origin to a small diagonal 1 and a large diagonal 2.  The diagonal 1 has an ostial  90% stenosis. After this, the LAD itself in the proximal segment has an eccentric 70% stenosis.  The mid-to-distal LAD is mildly diseased.  Diagonal 2 is large, smooth, and normal.  Circumflex coronary artery:  Circumflex coronary artery is a large- caliber vessel with a proximal 90% stenosis at origin of a large obtuse marginal 1, which has a ostial 80% stenosis. The cirumflex also gives origin to large OM2 which has a secondary branch.  This obtuse marginal 2 is subtotally occluded with TIMI 1 flow.  This is a large vessel giving secondary branches which are also large.  The mid-to-distal segment is underfilled and appears to be very small (due to underfilling).  The proximal circumflex at the bifurcation of a large obtuse marginal 1 has a high-grade 90% stenosis.  Ramus intermedius:  Ramus intermedius is a very moderate caliber vessel. It has ostial 80% stenosis.  Fractional flow reserve calculation: FFR fell down from baseline of 0.85 to 0.73, suggesting that the LAD stenosis was significant.  The patient with severe circumflex coronary artery disease which is extremely complex coronary intervention.  At this point, the thought process was to either bypass circumflex and right coronary artery. However, we decided to proceed with FFR evaluation of the LAD to see whether the LAD needs  to be bypassed.  After introducing the flow wire into the LAD, there was immediately a minimal pressure gradient at baseline.  With the adenosine infusion, the FFR fell down from baseline of 0.85 to 0.73, suggesting that the LAD stenosis was significant.  OVERALL IMPRESSION:  Triple-vessel coronary artery disease involving the proximal left anterior descending with at least greater than 70% stenosis ramus intermediate with 70%-80% stenosis of the very complex circumflex of obtuse marginal 1 bifurcation showing a 90% stenosis followed by obtuse marginal 2, which is subtotally occluded.  There was slow  flow noted in the distal obtuse marginal bed.  This vessel appears to be large.  There is a mid right coronary and a  distal right coronary artery high-grade 90% stenosis.  RECOMMENDATION:  Based on the coronary anatomy, he will benefit from coronary artery bypass grafting. I will start the patient on Integrilin to see if I can clear up some of the thrombus that has entered into the circumflex coronary artery.  PCP consultation will be obtained.  TECHNIQUE OF THE PROCEDURE:  Under sterile precautions using a 6-French right radial access, a 6-French TIG #4 catheter was advanced into the ascending aorta.  Right coronary artery and then the left main was selectively engaged and angiography was performed.  The same catheter was utilized to perform left ventriculography in the RAO projection. The catheter then pulled out of body and exchanged to a guide catheter.  Using Angiomax for anticoagulation, initially I tried to engage the left main coronary artery with AL 3.5, and then switched to JR-3.5 6-French guide catheter.  Because of the inability to engage the left main coronary artery, I went back with a diagnostic kick catheter, I was able to engage the right coronary artery.  The flow wire was then carefully advanced into the LAD.  After normalization and passing the stenosis, intravenous adenosine was administered using the standard protocol.  FFR was calculated at less than 2 minutes of adenosine infusion, which was significant at 0.73.  The flow wire was withdrawn, angiography repeated, the diagnostic catheter was disengaged and pulled out of body over a J- wire.  The patient tolerated the procedure well.  Hemostasis was obtained by applying TR band.  The patient will be transferred to a step- down unit for observation.  He will have inpatient coronary artery bypass grafting.     Pamella Pert, MD     JRG/MEDQ  D:  04/30/2011  T:  05/01/2011  Job:  [id number]  cc:   Corwin Levins, MD

## 2011-05-02 LAB — CBC
HCT: 38.5 % — ABNORMAL LOW (ref 39.0–52.0)
Hemoglobin: 13.6 g/dL (ref 13.0–17.0)
WBC: 9.3 10*3/uL (ref 4.0–10.5)

## 2011-05-02 NOTE — Consult Note (Signed)
Chief Complaint  Patient presents with  . Chest Pain    HISTORY of PRESENT ILLNESS:  Samuel Hawkins is a 55 y.o. male smoker admitted on 04/28/2011 with Syncope and collapse.    He had brief episode of chest pain, nausea, and diaphoresis.  He presented to [facility].  He had elevated CPK and MB fraction in ER.  He has hx of hyperlipidemia, and family hx of early CAD.  He had old ischemic changes on ECG and Echo.  He was evaluated by Dr. Jacinto Halim, and had cardiac cath which showed severe 3 vessel CAD with mildly depressed LV systolic function.  He was evaluated by Dr. Dorris Fetch from [facility] for CABG, and transferred to [facility].  Since he is residing in ICU, PCCM has been asked to assume primary care from [facility] hospitalist team.  Tests: 12/12 Echo>>EF 55 to 60%, mild hypokinesis posterior wall, grade 1 diastolic dysfx 12/14 PFT>>FEV1 3.10(89%), FEV1% 83  Overnight events 12/15 Denies complaints. Feels good. No questions. Still on dopamine. D/w Dr Jacinto Halim - PCCM can sign off. Dr Jacinto Halim will be primary  Past Medical History  Diagnosis Date  . Hyperlipidemia   . History of erectile dysfunction   . Glucose intolerance (impaired glucose tolerance)     history of    History reviewed. No pertinent past surgical history.  History reviewed. No pertinent family history.   reports that he has been smoking Cigarettes.  He has never used smokeless tobacco. He reports that he drinks alcohol. He reports that he does not use illicit drugs.  Allergies  Allergen Reactions  . Atorvastatin Other (See Comments)    REACTION: muscle cramp  . Simvastatin Other (See Comments)    REACTION: muscle cramp    Medications Prior to Admission  Medication Dose Route Frequency Provider Last Rate Last Dose  . 0.9 %  sodium chloride infusion  250 mL Intravenous PRN Pamella Pert      . 0.9 %  sodium chloride infusion   Intravenous Continuous Pamella Pert      . 0.9 %  sodium chloride infusion   Intravenous Continuous  Harland Dingwall Ganji 150 mL/hr at 04/30/11 1943    . 0.9 %  sodium chloride infusion   Intravenous Continuous Harland Dingwall Ganji 10 mL/hr at 04/30/11 2100 10 mL/hr at 04/30/11 2100  . alum & mag hydroxide-simeth (MAALOX/MYLANTA) 200-200-20 MG/5ML suspension 30 mL  30 mL Oral Q6H PRN Jagadeesh R Ganji   30 mL at 04/30/11 2340  . aspirin chewable tablet 324 mg  324 mg Oral Once Felisa Bonier, MD   324 mg at 04/28/11 2157  . aspirin chewable tablet 81 mg  81 mg Oral Once Jagadeesh R Ganji   81 mg at 04/30/11 1007  . aspirin chewable tablet 81 mg  81 mg Oral Daily Jagadeesh R Ganji   81 mg at 05/01/11 1107  . diazepam (VALIUM) tablet 10 mg  10 mg Oral On Call Harland Dingwall Ganji   10 mg at 04/30/11 1033  . diazepam (VALIUM) tablet 2 mg  2 mg Oral Q4H PRN Pamella Pert      . DOPamine (INTROPIN) 800 mg in dextrose 5 % 250 mL infusion  2-5 mcg/kg/min (Order-Specific) Intravenous Titrated Jagadeesh R Ganji 7.4 mL/hr at 05/02/11 0700 4.996 mcg/kg/min at 05/02/11 0700  . eptifibatide (INTEGRILIN) 75 mg / 100 mL infusion  2 mcg/kg/min Intravenous Continuous Riki Rusk, PHARMD 12.8 mL/hr at 05/01/11 0139 2 mcg/kg/min at 05/01/11 0139  .  eptifibatide (INTEGRILIN) 75 mg / 100 mL infusion  2 mcg/kg/min Intravenous Continuous Burlene Arnt Elder, PHARMD 12.8 mL/hr at 05/02/11 0700 2.005 mcg/kg/min at 05/02/11 0700  . influenza  inactive virus vaccine (FLUZONE/FLUARIX) injection 0.5 mL  0.5 mL Intramuscular Tomorrow-1000 Gagan S Lama      . morphine 2 MG/ML injection 2 mg  2 mg Intravenous Q2H PRN Pamella Pert      . ondansetron (ZOFRAN) 4 MG/2ML injection        4 mg at 04/30/11 2026  . ondansetron (ZOFRAN) injection 4 mg  4 mg Intravenous Q6H PRN Pamella Pert      . pantoprazole (PROTONIX) EC tablet 40 mg  40 mg Oral BID AC Jagadeesh R Ganji   40 mg at 05/02/11 0853  . potassium chloride SA (K-DUR,KLOR-CON) CR tablet 20 mEq  20 mEq Oral BID Tyrone Nine R Ganji   20 mEq at 04/30/11 2200  . rosuvastatin  (CRESTOR) tablet 40 mg  40 mg Oral q1800 Jagadeesh R Ganji   40 mg at 05/01/11 1740  . sodium chloride 0.9 % injection 3 mL  3 mL Intravenous PRN Harland Dingwall Ganji   6 mL at 05/01/11 0848  . zolpidem (AMBIEN) tablet 5 mg  5 mg Oral QHS PRN Harland Dingwall Ganji   5 mg at 05/01/11 2253  . DISCONTD: 0.9 %  sodium chloride infusion  999 mL Intravenous Continuous Felisa Bonier, MD 100 mL/hr at 04/28/11 2159 1,000 mL at 04/28/11 2159  . DISCONTD: 0.9 %  sodium chloride infusion   Intravenous Continuous Peter Le 150 mL/hr at 04/30/11 0640    . DISCONTD: acetaminophen (TYLENOL) suppository 650 mg  650 mg Rectal Q6H PRN Houston Siren      . DISCONTD: acetaminophen (TYLENOL) tablet 650 mg  650 mg Oral Q6H PRN Houston Siren      . DISCONTD: adenosine (ADENOCARD) 12 MG/4ML injection 48 mg  16 mL Intravenous Once Pamella Pert      . DISCONTD: aspirin chewable tablet 324 mg  324 mg Oral Pre-Cath Pamella Pert      . DISCONTD: atropine 0.1 MG/ML injection 1 mg  1 mg Intravenous Once Pamella Pert      . DISCONTD: DOPamine (INTROPIN) 800 mg in dextrose 5 % 250 mL infusion  2-20 mcg/kg/min Intravenous Titrated Pamella Pert      . DISCONTD: enoxaparin (LOVENOX) injection 40 mg  40 mg Subcutaneous QHS Peter Le   40 mg at 04/29/11 2230  . DISCONTD: enoxaparin (LOVENOX) injection 80 mg  80 mg Subcutaneous Q12H Riki Rusk, PHARMD   80 mg at 05/01/11 0330  . DISCONTD: metoprolol succinate (TOPROL-XL) 24 hr tablet 25 mg  25 mg Oral Daily Jagadeesh R Ganji   25 mg at 04/29/11 2230  . DISCONTD: ondansetron (ZOFRAN) injection 4 mg  4 mg Intravenous Q6H PRN Pamella Pert      . DISCONTD: rosuvastatin (CRESTOR) tablet 10 mg  10 mg Oral Daily Jagadeesh R Ganji   10 mg at 04/30/11 1008  . DISCONTD: zolpidem (AMBIEN) tablet 10 mg  10 mg Oral QHS PRN Houston Siren   10 mg at 04/29/11 2230   No current outpatient prescriptions on file as of 05/02/2011.    ROS: 12 point negative except above  PHYICAL EXAM:  Blood  pressure 89/60, pulse 46, temperature 98.6 F (37 C), temperature source Oral, resp. rate 17, height 5\' 7"  (1.702 m), weight 79.7 kg (175 lb 11.3 oz),  SpO2 96.00%.  General - Obese, no distress HEENT - PERRLA, EOMI, no sinus tenderness, no oral exudate, no LAN, no thyromegaly Cardiac - s1s2 regular, no murmur, pulses symmetric Chest - no wheeze/rales Abd - soft, nontender Ext - no edema Skin - no rashes Neuro - normal strength, CN intact Psych - normal mood, behavior  Lab Results  Component Value Date   WBC 9.3 05/02/2011   HGB 13.6 05/02/2011   HCT 38.5* 05/02/2011   MCV 86.1 05/02/2011   PLT 313 05/02/2011  ,  Lab Results  Component Value Date   CREATININE 0.81 05/01/2011   BUN 8 05/01/2011   NA 139 05/01/2011   K 3.6 05/01/2011   CL 107 05/01/2011   CO2 24 05/01/2011  ,  Lab Results  Component Value Date   ALT 24 04/28/2011   AST 28 04/28/2011   ALKPHOS 70 04/28/2011   BILITOT 0.3 04/28/2011  ,  Lab Results  Component Value Date   CKTOTAL 121 05/01/2011   CKMB 4.1* 05/01/2011   TROPONINI <0.30 05/01/2011    Lab Results  Component Value Date   TSH 1.444 04/29/2011   ABG    Component Value Date/Time   PHART 7.384 05/01/2011 0546   HCO3 21.4 05/01/2011 0546   TCO2 22 05/01/2011 0546   ACIDBASEDEF 3.0* 05/01/2011 0546   O2SAT 94.0 05/01/2011 0546   Dg Chest 2 View  04/28/2011  *RADIOLOGY REPORT*  Clinical Data: Chest pain, fever, syncope.  Smoker.  CHEST - 2 VIEW  Comparison: None.  Findings: The heart size and pulmonary vascularity are normal. The lungs appear clear and expanded without focal air space disease or consolidation. No blunting of the costophrenic angles.  No pneumothorax.  Probable old right rib fracture.  Mild degenerative changes in the spine.  IMPRESSION: No evidence of active pulmonary disease.  Original Report Authenticated By: Marlon Pel, M.D.   ASSESSMENT/PLAN:   Chest pain/Syncope/Acute coronary syndrome with severe 3 vessel  CAD and mild LV systolic dysfunction - No chest pain or dyspnea or complaints on 12/15 -plan for CABG 12/17 per [facility] -no beta blocker due to hypotension, bradycardia  Hypotension with bradycardia -dopamine infusion per cardiology, [facility]  Hyperlipidemia -per cardiology   [facility] Pager:  [phone] 05/02/2011, 9:50 AM   PCCMS will sign off. Dr Jacinto Halim will be primary

## 2011-05-02 NOTE — Progress Notes (Signed)
Subjective:  Patient feels better this morning. He had some burning sensation in the chest last night which was relieved with Maalox and was also started on Protonix. This morning is feeling better and denies any chest pain, shortness of breath. His current progress surprisingly does not reveal acute myocardial injury. Objective:  Vital Signs in the last 24 hours: Temp:  [97.7 F (36.5 C)-98.6 F (37 C)] 98.6 F (37 C) (12/15 0803) Pulse Rate:  [40-59] 46  (12/15 0900) Resp:  [16-23] 17  (12/15 0900) BP: (79-125)/(37-74) 89/60 mmHg (12/15 0900) SpO2:  [94 %-100 %] 96 % (12/15 0900) Weight:  [79.7 kg (175 lb 11.3 oz)] 175 lb 11.3 oz (79.7 kg) (12/15 0500)  Intake/Output from previous day: 12/14 0701 - 12/15 0700 In: 2215.6 [P.O.:1495; I.V.:720.6] Out: 3150 [Urine:3150]  Physical Exam:   General appearance: alert, cooperative, appears stated age and mildly obese Eyes: conjunctivae/corneas clear. PERRL, EOM's intact. Fundi benign. Neck: no adenopathy, no carotid bruit, no JVD, supple, symmetrical, trachea midline and thyroid not enlarged, symmetric, no tenderness/mass/nodules Neck: JVP - normal, carotids 2+= without bruits Resp: clear to auscultation bilaterally Chest wall: no tenderness Cardio: S1, S2 normal there is a 2/6 systolic ejection murmur heard in the apex and right parasternal border. GI: soft, non-tender; bowel sounds normal; no masses,  no organomegaly Extremities: extremities normal, atraumatic, no cyanosis or edema    Lab Results:  Basename 05/02/11 0500 05/01/11 0500  WBC 9.3 7.8  HGB 13.6 13.7  PLT 313 278    Basename 05/01/11 0500 04/30/11 0001  NA 139 134*  K 3.6 3.4*  CL 107 104  CO2 24 23  GLUCOSE 115* 90  BUN 8 12  CREATININE 0.81 0.90    Basename 05/01/11 1137 05/01/11 0500  TROPONINI <0.30 <0.30   Hepatic Function Panel No results found for this basename: PROT,ALBUMIN,AST,ALT,ALKPHOS,BILITOT,BILIDIR,IBILI in the last 72 hours  Basename  05/01/11  CHOL 116   No results found for this basename: PROTIME in the last 72 hours  Imaging: Imaging results have been reviewed  Cardiac Studies: Negative cardiac enzymes.   Assessment/Plan:  1. Acute coronary syndrome, unstable angina with evidence of severe triple-vessel coronary disease. Proximal LAD physiologically significant with FFR 0.73. Proximal circumflex 90% stenosis at the origin of a large obtuse marginal 1 which has ostial 90% stenosis. Obtuse marginal 2 which is very large approximately is subtotally occluded with thrombus. Ramus intermediate ostial 80% stenosis. Mid right coronary artery ended distal right carotid artery with a 90% stenosis. Preserved ejection fraction of 55-60%. Echocardiogram revealing posterior wall hypokinesis. No further chest pain or dyspnea. No arrhythmias noted.  2. Strong family step was to (in brother who died with myocardial infarction at age of 47 3. Hyperlipidemia was not on therapy due to myalgias. No lipid panel done this admission. Will obtain one.  Recommendation: Continue observation. Continue Integrilin 2-4 hours prior to CABG. Appreciate Dr. Dorris Fetch seen the patient. She had chest pain last night in the form of burning sensation and I suspect this is his anginal equivalent. If possible I would like to proceed with CABG sooner if she continues to have chest discomfort but if he remains stable he is scheduled for surgery on Monday morning. He is extremely sensitive to beta blockers and has baseline bradycardia hence I will discontinue all beta blockers. His heart rate at baseline is 40-45 beats per minute and with a very low dose of beta blocker he dropped his blood pressure and was asymptomatic. I suspect this  could be due to 3 vessel coronary disease. He may be able to tolerate a beta blocker after his CABG.   Pamella Pert, M.D. 05/02/2011, 9:45 AM

## 2011-05-03 ENCOUNTER — Inpatient Hospital Stay (HOSPITAL_COMMUNITY): Payer: Managed Care, Other (non HMO)

## 2011-05-03 LAB — CBC
Hemoglobin: 14.5 g/dL (ref 13.0–17.0)
MCH: 30.5 pg (ref 26.0–34.0)
MCV: 85.7 fL (ref 78.0–100.0)
RBC: 4.75 MIL/uL (ref 4.22–5.81)

## 2011-05-03 LAB — TYPE AND SCREEN
ABO/RH(D): A POS
Antibody Screen: NEGATIVE

## 2011-05-03 MED ORDER — SODIUM CHLORIDE 0.9 % IV SOLN
INTRAVENOUS | Status: DC
  Filled 2011-05-03: qty 1

## 2011-05-03 MED ORDER — PLASMA-LYTE 148 IV SOLN
INTRAVENOUS | Status: AC
  Administered 2011-05-04: 11:00:00
  Filled 2011-05-03: qty 0.5

## 2011-05-03 MED ORDER — DEXTROSE 5 % IV SOLN
750.0000 mg | INTRAVENOUS | Status: DC
  Filled 2011-05-03: qty 750

## 2011-05-03 MED ORDER — BISACODYL 5 MG PO TBEC
5.0000 mg | DELAYED_RELEASE_TABLET | Freq: Once | ORAL | Status: AC
  Administered 2011-05-03: 5 mg via ORAL
  Filled 2011-05-03: qty 1

## 2011-05-03 MED ORDER — NITROGLYCERIN IN D5W 200-5 MCG/ML-% IV SOLN: 2.0000 ug/min | INTRAVENOUS | Status: DC

## 2011-05-03 MED ORDER — MAGNESIUM SULFATE 50 % IJ SOLN
40.0000 meq | INTRAMUSCULAR | Status: DC
  Filled 2011-05-03: qty 10

## 2011-05-03 MED ORDER — NITROGLYCERIN IN D5W 200-5 MCG/ML-% IV SOLN
2.0000 ug/min | INTRAVENOUS | Status: DC
  Filled 2011-05-03: qty 250

## 2011-05-03 MED ORDER — DEXTROSE 5 % IV SOLN
1.5000 g | INTRAVENOUS | Status: DC
  Filled 2011-05-03: qty 1.5

## 2011-05-03 MED ORDER — CHLORHEXIDINE GLUCONATE 4 % EX LIQD
60.0000 mL | Freq: Once | CUTANEOUS | Status: AC
  Administered 2011-05-04: 4 via TOPICAL

## 2011-05-03 MED ORDER — METOPROLOL TARTRATE 12.5 MG HALF TABLET
12.5000 mg | ORAL_TABLET | Freq: Once | ORAL | Status: DC
  Filled 2011-05-03: qty 1

## 2011-05-03 MED ORDER — CHLORHEXIDINE GLUCONATE 4 % EX LIQD
60.0000 mL | Freq: Once | CUTANEOUS | Status: AC
  Administered 2011-05-04: 4 via TOPICAL
  Filled 2011-05-03 (×2): qty 60

## 2011-05-03 MED ORDER — DOPAMINE-DEXTROSE 3.2-5 MG/ML-% IV SOLN: 2.0000 ug/kg/min | INTRAVENOUS | Status: DC

## 2011-05-03 MED ORDER — POTASSIUM CHLORIDE 2 MEQ/ML IV SOLN
80.0000 meq | INTRAVENOUS | Status: DC
  Filled 2011-05-03: qty 40

## 2011-05-03 MED ORDER — DEXTROSE 5 % IV SOLN
30.0000 ug/min | INTRAVENOUS | Status: DC
  Filled 2011-05-03: qty 2

## 2011-05-03 MED ORDER — VANCOMYCIN HCL 1000 MG IV SOLR
1250.0000 mg | INTRAVENOUS | Status: DC
  Filled 2011-05-03: qty 1250

## 2011-05-03 MED ORDER — SODIUM CHLORIDE 0.9 % IV SOLN
INTRAVENOUS | Status: DC
  Filled 2011-05-03: qty 40

## 2011-05-03 MED ORDER — DOPAMINE-DEXTROSE 3.2-5 MG/ML-% IV SOLN
2.0000 ug/kg/min | INTRAVENOUS | Status: DC
  Filled 2011-05-03: qty 250

## 2011-05-03 MED ORDER — SODIUM CHLORIDE 0.9 % IV SOLN
0.1000 ug/kg/h | INTRAVENOUS | Status: DC
  Filled 2011-05-03: qty 4

## 2011-05-03 MED ORDER — CHLORHEXIDINE GLUCONATE 4 % EX LIQD
60.0000 mL | Freq: Once | CUTANEOUS | Status: AC
  Administered 2011-05-03: 4 via TOPICAL

## 2011-05-03 MED ORDER — EPINEPHRINE HCL 1 MG/ML IJ SOLN
0.5000 ug/min | INTRAVENOUS | Status: DC
  Filled 2011-05-03: qty 4

## 2011-05-03 NOTE — Progress Notes (Signed)
3 Days Post-Op Procedure(s) (LRB): LEFT HEART CATHETERIZATION WITH CORONARY ANGIOGRAM (N/A) Subjective: Had some brief indigestion relieved with Maalox last pm but denies any chest discomfort or dyspnea.    He remains on dopamine 5 mcg/kg/min for bradycardia and hypotension.  Cardiac enzymes have all been negative.  Objective: Vital signs in last 24 hours: Temp:  [97.6 F (36.4 C)-98.6 F (37 C)] 98.5 F (36.9 C) (12/16 1544) Pulse Rate:  [42-69] 55  (12/16 1515) Cardiac Rhythm:  [-] Sinus bradycardia (12/16 0900) Resp:  [10-23] 17  (12/16 1515) BP: (85-128)/(51-86) 109/84 mmHg (12/16 1400) SpO2:  [96 %-100 %] 98 % (12/16 1515)  Hemodynamic parameters for last 24 hours:    Intake/Output from previous day: 12/15 0701 - 12/16 0700 In: 767.4 [P.O.:60; I.V.:707.4] Out: 2450 [Urine:2450] Intake/Output this shift: Total I/O In: 691.6 [P.O.:460; I.V.:231.6] Out: 750 [Urine:750]  General appearance: alert and cooperative Heart: regular rate and rhythm, S1, S2 normal, no murmur, click, rub or gallop Lungs: clear to auscultation bilaterally  Lab Results:  Basename 05/03/11 0625 05/02/11 0500  WBC 10.5 9.3  HGB 14.5 13.6  HCT 40.7 38.5*  PLT 346 313   BMET:  Basename 05/01/11 0500  NA 139  K 3.6  CL 107  CO2 24  GLUCOSE 115*  BUN 8  CREATININE 0.81  CALCIUM 7.7*    PT/INR: No results found for this basename: LABPROT,INR in the last 72 hours ABG    Component Value Date/Time   PHART 7.384 05/01/2011 0546   HCO3 21.4 05/01/2011 0546   TCO2 22 05/01/2011 0546   ACIDBASEDEF 3.0* 05/01/2011 0546   O2SAT 94.0 05/01/2011 0546   CBG (last 3)  No results found for this basename: GLUCAP:3 in the last 72 hours  Assessment/Plan: S/P Procedure(s) (LRB): LEFT HEART CATHETERIZATION WITH CORONARY ANGIOGRAM (N/A) Stable for CABG by Dr. Dorris Fetch in am.  Pt has no further questions.   LOS: 5 days    Millie Forde K 05/03/2011

## 2011-05-03 NOTE — Progress Notes (Signed)
Subjective:  Patient feels better this morning. He had some burning sensation in the chest last night which was relieved with Maalox and was also started on Protonix. This morning is feeling better and denies any chest pain, shortness of breath. His current progress surprisingly does not reveal acute myocardial injury. Objective:  Vital Signs in the last 24 hours: Temp:  [98.1 F (36.7 C)-98.6 F (37 C)] 98.4 F (36.9 C) (12/16 0355) Pulse Rate:  [42-65] 62  (12/16 0800) Resp:  [14-21] 15  (12/16 0800) BP: (85-128)/(46-86) 128/72 mmHg (12/16 0800) SpO2:  [96 %-100 %] 99 % (12/16 0800)  Intake/Output from previous day: 12/15 0701 - 12/16 0700 In: 754.6 [P.O.:60; I.V.:694.6] Out: 2450 [Urine:2450]  Physical Exam:   General appearance: alert, cooperative, appears stated age and mildly obese Eyes: conjunctivae/corneas clear. PERRL, EOM's intact. Fundi benign. Neck: no adenopathy, no carotid bruit, no JVD, supple, symmetrical, trachea midline and thyroid not enlarged, symmetric, no tenderness/mass/nodules Neck: JVP - normal, carotids 2+= without bruits Resp: clear to auscultation bilaterally Chest wall: no tenderness Cardio: S1, S2 normal there is a 2/6 systolic ejection murmur heard in the apex and right parasternal border. GI: soft, non-tender; bowel sounds normal; no masses,  no organomegaly Extremities: extremities normal, atraumatic, no cyanosis or edema    Lab Results:  Basename 05/03/11 0625 05/02/11 0500  WBC 10.5 9.3  HGB 14.5 13.6  PLT 346 313    Basename 05/01/11 0500  NA 139  K 3.6  CL 107  CO2 24  GLUCOSE 115*  BUN 8  CREATININE 0.81    Basename 05/01/11 1137 05/01/11 0500  TROPONINI <0.30 <0.30   Hepatic Function Panel No results found for this basename: PROT,ALBUMIN,AST,ALT,ALKPHOS,BILITOT,BILIDIR,IBILI in the last 72 hours  Basename 05/01/11  CHOL 116   No results found for this basename: PROTIME in the last 72 hours  Imaging: Imaging results  have been reviewed  Cardiac Studies: Negative cardiac enzymes.   Assessment/Plan:  1. Acute coronary syndrome, unstable angina with evidence of severe triple-vessel coronary disease. Proximal LAD physiologically significant with FFR 0.73. Proximal circumflex 90% stenosis at the origin of a large obtuse marginal 1 which has ostial 90% stenosis. Obtuse marginal 2 which is very large approximately is subtotally occluded with thrombus. Ramus intermediate ostial 80% stenosis. Mid right coronary artery ended distal right carotid artery with a 90% stenosis. Preserved ejection fraction of 55-60%. Echocardiogram revealing posterior wall hypokinesis. No further chest pain or dyspnea. No arrhythmias noted.  2. Strong family step was to (in brother who died with myocardial infarction at age of 37 3. Hyperlipidemia was not on therapy due to myalgias.  4. Patient still on low dose of Dopamine due to hypotension and bradycardia. I suspect this is due to global ischemia and should improve post CABG. Hence not on a beta blocker.  Recommendation: Continue observation. Continue Integrilin 2-4 hours prior to CABG. Appreciate Dr. Dorris Fetch seen the patient. She had chest pain last night in the form of burning sensation and I suspect this is his anginal equivalent. If possible I would like to proceed with CABG sooner if she continues to have chest discomfort but if he remains stable he is scheduled for surgery on Monday morning. He is extremely sensitive to beta blockers and has baseline bradycardia hence I will discontinue all beta blockers. His heart rate at baseline is 40-45 beats per minute and with a very low dose of beta blocker he dropped his blood pressure and was asymptomatic.  Pamella Pert, M.D.  05/03/2011, 10:28 AM

## 2011-05-04 ENCOUNTER — Encounter (HOSPITAL_COMMUNITY): Payer: Self-pay | Admitting: Anesthesiology

## 2011-05-04 ENCOUNTER — Inpatient Hospital Stay (HOSPITAL_COMMUNITY): Payer: Managed Care, Other (non HMO) | Admitting: Anesthesiology

## 2011-05-04 ENCOUNTER — Other Ambulatory Visit: Payer: Self-pay

## 2011-05-04 ENCOUNTER — Encounter (HOSPITAL_COMMUNITY)
Admission: EM | Disposition: A | Discharge status: Home / Self Care | Payer: Self-pay | Source: Home / Self Care | Attending: Thoracic Surgery (Cardiothoracic Vascular Surgery)

## 2011-05-04 ENCOUNTER — Inpatient Hospital Stay (HOSPITAL_COMMUNITY): Payer: Managed Care, Other (non HMO)

## 2011-05-04 DIAGNOSIS — I2510 Atherosclerotic heart disease of native coronary artery without angina pectoris: Secondary | ICD-10-CM

## 2011-05-04 HISTORY — PX: CORONARY ARTERY BYPASS GRAFT: SHX141

## 2011-05-04 LAB — CBC
HCT: 40.6 % (ref 39.0–52.0)
HCT: 32.9 % — ABNORMAL LOW (ref 39.0–52.0)
Hemoglobin: 14.7 g/dL (ref 13.0–17.0)
Hemoglobin: 11.4 g/dL — ABNORMAL LOW (ref 13.0–17.0)
Hemoglobin: 11.8 g/dL — ABNORMAL LOW (ref 13.0–17.0)
MCH: 31 pg (ref 26.0–34.0)
MCHC: 36.2 g/dL — ABNORMAL HIGH (ref 30.0–36.0)
MCHC: 35.8 g/dL (ref 30.0–36.0)
MCHC: 35.9 g/dL (ref 30.0–36.0)
Platelets: 183 10*3/uL (ref 150–400)
RBC: 4.75 MIL/uL (ref 4.22–5.81)
RBC: 3.71 MIL/uL — ABNORMAL LOW (ref 4.22–5.81)
RBC: 3.81 MIL/uL — ABNORMAL LOW (ref 4.22–5.81)
WBC: 11.7 10*3/uL — ABNORMAL HIGH (ref 4.0–10.5)

## 2011-05-04 LAB — POCT I-STAT 4, (NA,K, GLUC, HGB,HCT)
Glucose, Bld: 101 mg/dL — ABNORMAL HIGH (ref 70–99)
Glucose, Bld: 87 mg/dL (ref 70–99)
Glucose, Bld: 106 mg/dL — ABNORMAL HIGH (ref 70–99)
HCT: 40 % (ref 39.0–52.0)
HCT: 24 % — ABNORMAL LOW (ref 39.0–52.0)
HCT: 26 % — ABNORMAL LOW (ref 39.0–52.0)
HCT: 33 % — ABNORMAL LOW (ref 39.0–52.0)
Hemoglobin: 12.6 g/dL — ABNORMAL LOW (ref 13.0–17.0)
Hemoglobin: 8.2 g/dL — ABNORMAL LOW (ref 13.0–17.0)
Hemoglobin: 8.8 g/dL — ABNORMAL LOW (ref 13.0–17.0)
Potassium: 3.9 mEq/L (ref 3.5–5.1)
Potassium: 3.9 mEq/L (ref 3.5–5.1)
Potassium: 4.9 mEq/L (ref 3.5–5.1)
Sodium: 141 mEq/L (ref 135–145)
Sodium: 138 mEq/L (ref 135–145)
Sodium: 139 mEq/L (ref 135–145)
Sodium: 142 mEq/L (ref 135–145)

## 2011-05-04 LAB — POCT I-STAT, CHEM 8
BUN: 9 mg/dL (ref 6–23)
Calcium, Ion: 1.17 mmol/L (ref 1.12–1.32)
Chloride: 108 mEq/L (ref 96–112)
Creatinine, Ser: 0.9 mg/dL (ref 0.50–1.35)
Glucose, Bld: 145 mg/dL — ABNORMAL HIGH (ref 70–99)
HCT: 33 % — ABNORMAL LOW (ref 39.0–52.0)
Hemoglobin: 11.2 g/dL — ABNORMAL LOW (ref 13.0–17.0)
Potassium: 5.3 mEq/L — ABNORMAL HIGH (ref 3.5–5.1)
Sodium: 142 mEq/L (ref 135–145)
TCO2: 22 mmol/L (ref 0–100)

## 2011-05-04 LAB — POCT I-STAT 3, ART BLOOD GAS (G3+)
Acid-base deficit: 4 mmol/L — ABNORMAL HIGH (ref 0.0–2.0)
Bicarbonate: 21.2 mEq/L (ref 20.0–24.0)
Bicarbonate: 20.9 mEq/L (ref 20.0–24.0)
Bicarbonate: 22.4 mEq/L (ref 20.0–24.0)
Bicarbonate: 22.3 mEq/L (ref 20.0–24.0)
Patient temperature: 35.6
Patient temperature: 36.8
Patient temperature: 37
TCO2: 22 mmol/L (ref 0–100)
TCO2: 24 mmol/L (ref 0–100)
pCO2 arterial: 40.4 mmHg (ref 35.0–45.0)
pCO2 arterial: 44.7 mmHg (ref 35.0–45.0)
pH, Arterial: 7.341 — ABNORMAL LOW (ref 7.350–7.450)
pH, Arterial: 7.366 (ref 7.350–7.450)
pH, Arterial: 7.35 (ref 7.350–7.450)
pH, Arterial: 7.307 — ABNORMAL LOW (ref 7.350–7.450)
pO2, Arterial: 369 mmHg — ABNORMAL HIGH (ref 80.0–100.0)

## 2011-05-04 LAB — MAGNESIUM: Magnesium: 3 mg/dL — ABNORMAL HIGH (ref 1.5–2.5)

## 2011-05-04 LAB — PLATELET COUNT: Platelets: 239 10*3/uL (ref 150–400)

## 2011-05-04 LAB — PROTIME-INR
INR: 1.54 — ABNORMAL HIGH (ref 0.00–1.49)
Prothrombin Time: 18.8 seconds — ABNORMAL HIGH (ref 11.6–15.2)

## 2011-05-04 LAB — CREATININE, SERUM: GFR calc Af Amer: >90 mL/min (ref >90)

## 2011-05-04 SURGERY — CORONARY ARTERY BYPASS GRAFTING (CABG)
Anesthesia: General | Wound class: Clean

## 2011-05-04 MED ORDER — HEPARIN SODIUM (PORCINE) 1000 UNIT/ML IJ SOLN
INTRAMUSCULAR | Status: DC | PRN
  Administered 2011-05-04: 36000 [IU] via INTRAVENOUS
  Administered 2011-05-04: 2000 [IU] via INTRAVENOUS

## 2011-05-04 MED ORDER — CALCIUM CHLORIDE 10 % IV SOLN
1.0000 g | Freq: Once | INTRAVENOUS | Status: AC
  Administered 2011-05-04: 1 g via INTRAVENOUS
  Filled 2011-05-04: qty 10

## 2011-05-04 MED ORDER — PHENYLEPHRINE HCL 10 MG/ML IJ SOLN
20.0000 mg | INTRAVENOUS | Status: DC | PRN
  Administered 2011-05-04: 50 ug/min via INTRAVENOUS

## 2011-05-04 MED ORDER — ASPIRIN EC 325 MG PO TBEC
325.0000 mg | DELAYED_RELEASE_TABLET | Freq: Every day | ORAL | Status: DC
  Administered 2011-05-06 – 2011-05-08 (×3): 325 mg via ORAL
  Filled 2011-05-04 (×4): qty 1

## 2011-05-04 MED ORDER — GLYCOPYRROLATE 0.2 MG/ML IJ SOLN
INTRAMUSCULAR | Status: DC | PRN
  Administered 2011-05-04: 0.2 mg via INTRAVENOUS

## 2011-05-04 MED ORDER — POTASSIUM CHLORIDE 10 MEQ/50ML IV SOLN
10.0000 meq | INTRAVENOUS | Status: AC
  Administered 2011-05-04 (×2): 10 meq via INTRAVENOUS

## 2011-05-04 MED ORDER — EPHEDRINE SULFATE 50 MG/ML IJ SOLN
INTRAMUSCULAR | Status: DC | PRN
  Administered 2011-05-04: 5 mg via INTRAVENOUS
  Administered 2011-05-04 (×7): 2.5 mg via INTRAVENOUS
  Administered 2011-05-04: 5 mg via INTRAVENOUS

## 2011-05-04 MED ORDER — MAGNESIUM SULFATE 40 MG/ML IJ SOLN
4.0000 g | Freq: Once | INTRAMUSCULAR | Status: AC
  Administered 2011-05-04: 4 g via INTRAVENOUS
  Filled 2011-05-04: qty 100

## 2011-05-04 MED ORDER — ACETAMINOPHEN 650 MG RE SUPP
650.0000 mg | RECTAL | Status: AC
  Administered 2011-05-04: 650 mg via RECTAL

## 2011-05-04 MED ORDER — SODIUM CHLORIDE 0.9 % IV SOLN
0.4000 ug/kg/h | INTRAVENOUS | Status: AC
  Filled 2011-05-04: qty 2

## 2011-05-04 MED ORDER — LACTATED RINGERS IV SOLN
INTRAVENOUS | Status: DC | PRN
  Administered 2011-05-04: 10:00:00 via INTRAVENOUS

## 2011-05-04 MED ORDER — METOPROLOL TARTRATE 12.5 MG HALF TABLET
12.5000 mg | ORAL_TABLET | Freq: Two times a day (BID) | ORAL | Status: DC
  Filled 2011-05-04 (×3): qty 1

## 2011-05-04 MED ORDER — NITROGLYCERIN IN D5W 200-5 MCG/ML-% IV SOLN: 0.0000 ug/min | INTRAVENOUS | Status: DC

## 2011-05-04 MED ORDER — OXYCODONE HCL 5 MG PO TABS
5.0000 mg | ORAL_TABLET | ORAL | Status: DC | PRN
  Administered 2011-05-05 (×2): 10 mg via ORAL
  Administered 2011-05-05: 5 mg via ORAL
  Administered 2011-05-06: 10 mg via ORAL
  Administered 2011-05-06: 5 mg via ORAL
  Administered 2011-05-07 – 2011-05-08 (×5): 10 mg via ORAL
  Filled 2011-05-04 (×5): qty 2
  Filled 2011-05-04: qty 1
  Filled 2011-05-04: qty 2
  Filled 2011-05-04: qty 1
  Filled 2011-05-04 (×2): qty 2

## 2011-05-04 MED ORDER — MIDAZOLAM HCL 5 MG/5ML IJ SOLN
INTRAMUSCULAR | Status: DC | PRN
  Administered 2011-05-04 (×2): 5 mg via INTRAVENOUS
  Administered 2011-05-04 (×2): 4 mg via INTRAVENOUS
  Administered 2011-05-04 (×2): 1 mg via INTRAVENOUS

## 2011-05-04 MED ORDER — ACETAMINOPHEN 160 MG/5ML PO SOLN
975.0000 mg | Freq: Four times a day (QID) | ORAL | Status: DC
  Filled 2011-05-04: qty 40.6

## 2011-05-04 MED ORDER — BISACODYL 10 MG RE SUPP: 10.0000 mg | Freq: Every day | RECTAL | Status: DC

## 2011-05-04 MED ORDER — 0.9 % SODIUM CHLORIDE (POUR BTL) OPTIME
TOPICAL | Status: DC | PRN
  Administered 2011-05-04: 1000 mL

## 2011-05-04 MED ORDER — NITROGLYCERIN IN D5W 200-5 MCG/ML-% IV SOLN
INTRAVENOUS | Status: DC | PRN
  Administered 2011-05-04: 16.66 ug/kg/min via INTRAVENOUS

## 2011-05-04 MED ORDER — PROTAMINE SULFATE 10 MG/ML IV SOLN
INTRAVENOUS | Status: DC | PRN
  Administered 2011-05-04 (×2): 100 mg via INTRAVENOUS
  Administered 2011-05-04: 40 mg via INTRAVENOUS
  Administered 2011-05-04 (×2): 30 mg via INTRAVENOUS

## 2011-05-04 MED ORDER — BISACODYL 5 MG PO TBEC
10.0000 mg | DELAYED_RELEASE_TABLET | Freq: Every day | ORAL | Status: DC
  Administered 2011-05-05 – 2011-05-08 (×3): 10 mg via ORAL
  Filled 2011-05-04 (×2): qty 2

## 2011-05-04 MED ORDER — SODIUM CHLORIDE 0.9 % IV SOLN: 250.0000 mL | INTRAVENOUS | Status: DC

## 2011-05-04 MED ORDER — HYDROMORPHONE HCL PF 1 MG/ML IJ SOLN: 0.2500 mg | INTRAMUSCULAR | Status: DC | PRN

## 2011-05-04 MED ORDER — HEMOSTATIC AGENTS (NO CHARGE) OPTIME
TOPICAL | Status: DC | PRN
  Administered 2011-05-04: 3 via TOPICAL

## 2011-05-04 MED ORDER — VANCOMYCIN HCL 1000 MG IV SOLR
1250.0000 mg | INTRAVENOUS | Status: DC | PRN
  Administered 2011-05-04: 1250 mg via INTRAVENOUS

## 2011-05-04 MED ORDER — LACTATED RINGERS IV SOLN
INTRAVENOUS | Status: DC | PRN
  Administered 2011-05-04 (×2): via INTRAVENOUS

## 2011-05-04 MED ORDER — SODIUM CHLORIDE 0.9 % IV SOLN
INTRAVENOUS | Status: DC | PRN
  Administered 2011-05-04: 16:00:00 via INTRAVENOUS

## 2011-05-04 MED ORDER — DEXTROSE 5 % IV SOLN
1.5000 g | INTRAVENOUS | Status: DC | PRN
  Administered 2011-05-04: 1.5 g via INTRAVENOUS

## 2011-05-04 MED ORDER — ASPIRIN 81 MG PO CHEW
324.0000 mg | CHEWABLE_TABLET | Freq: Every day | ORAL | Status: DC
  Administered 2011-05-05: 324 mg
  Filled 2011-05-04: qty 4

## 2011-05-04 MED ORDER — DOCUSATE SODIUM 100 MG PO CAPS
200.0000 mg | ORAL_CAPSULE | Freq: Every day | ORAL | Status: DC
  Administered 2011-05-05 – 2011-05-08 (×3): 200 mg via ORAL
  Filled 2011-05-04: qty 1
  Filled 2011-05-04 (×2): qty 2

## 2011-05-04 MED ORDER — ONDANSETRON HCL 4 MG/2ML IJ SOLN
4.0000 mg | Freq: Four times a day (QID) | INTRAMUSCULAR | Status: DC | PRN
  Administered 2011-05-05: 4 mg via INTRAVENOUS
  Filled 2011-05-04 (×2): qty 2

## 2011-05-04 MED ORDER — MIDAZOLAM HCL 2 MG/2ML IJ SOLN: 2.0000 mg | INTRAMUSCULAR | Status: DC | PRN

## 2011-05-04 MED ORDER — MORPHINE SULFATE 2 MG/ML IJ SOLN
1.0000 mg | INTRAMUSCULAR | Status: AC | PRN
  Administered 2011-05-04 (×2): 2 mg via INTRAVENOUS
  Filled 2011-05-04 (×2): qty 1

## 2011-05-04 MED ORDER — SODIUM CHLORIDE 0.9 % IV SOLN
100.0000 [IU] | INTRAVENOUS | Status: DC | PRN
  Administered 2011-05-04: 1 [IU]/h via INTRAVENOUS

## 2011-05-04 MED ORDER — VECURONIUM BROMIDE 10 MG IV SOLR
INTRAVENOUS | Status: DC | PRN
  Administered 2011-05-04: 3 mg via INTRAVENOUS
  Administered 2011-05-04: 5 mg via INTRAVENOUS
  Administered 2011-05-04: 2 mg via INTRAVENOUS
  Administered 2011-05-04: 5 mg via INTRAVENOUS
  Administered 2011-05-04: 10 mg via INTRAVENOUS

## 2011-05-04 MED ORDER — PROPOFOL 10 MG/ML IV EMUL
INTRAVENOUS | Status: DC | PRN
  Administered 2011-05-04: 160 mg via INTRAVENOUS

## 2011-05-04 MED ORDER — HEMOSTATIC AGENTS (NO CHARGE) OPTIME
TOPICAL | Status: DC | PRN
  Administered 2011-05-04: 1 via TOPICAL

## 2011-05-04 MED ORDER — SODIUM CHLORIDE 0.9 % IV SOLN
0.1000 ug/kg/h | INTRAVENOUS | Status: DC
  Filled 2011-05-04: qty 2

## 2011-05-04 MED ORDER — INSULIN ASPART 100 UNIT/ML ~~LOC~~ SOLN
0.0000 [IU] | SUBCUTANEOUS | Status: AC
  Administered 2011-05-04: 2 [IU] via SUBCUTANEOUS
  Filled 2011-05-04: qty 3

## 2011-05-04 MED ORDER — PANTOPRAZOLE SODIUM 40 MG PO TBEC: 40.0000 mg | DELAYED_RELEASE_TABLET | Freq: Every day | ORAL | Status: DC

## 2011-05-04 MED ORDER — SODIUM CHLORIDE 0.9 % IV SOLN
10.0000 g | INTRAVENOUS | Status: DC | PRN
  Administered 2011-05-04: 5 g/h via INTRAVENOUS

## 2011-05-04 MED ORDER — LACTATED RINGERS IV SOLN: INTRAVENOUS | Status: DC

## 2011-05-04 MED ORDER — ROCURONIUM BROMIDE 100 MG/10ML IV SOLN
INTRAVENOUS | Status: DC | PRN
  Administered 2011-05-04: 50 mg via INTRAVENOUS

## 2011-05-04 MED ORDER — DEXTROSE 5 % IV SOLN
1.5000 g | Freq: Two times a day (BID) | INTRAVENOUS | Status: AC
  Administered 2011-05-04 – 2011-05-06 (×4): 1.5 g via INTRAVENOUS
  Filled 2011-05-04 (×4): qty 1.5

## 2011-05-04 MED ORDER — DOPAMINE-DEXTROSE 3.2-5 MG/ML-% IV SOLN
INTRAVENOUS | Status: DC | PRN
  Administered 2011-05-04: 5 ug/kg/min via INTRAVENOUS

## 2011-05-04 MED ORDER — SODIUM CHLORIDE 0.9 % IJ SOLN: 3.0000 mL | INTRAMUSCULAR | Status: DC | PRN

## 2011-05-04 MED ORDER — ALBUMIN HUMAN 5 % IV SOLN
250.0000 mL | INTRAVENOUS | Status: AC | PRN
  Administered 2011-05-04 – 2011-05-05 (×3): 250 mL via INTRAVENOUS
  Filled 2011-05-04: qty 250

## 2011-05-04 MED ORDER — SODIUM CHLORIDE 0.9 % IV SOLN
5.0000 g | INTRAVENOUS | Status: AC
  Administered 2011-05-04: 5 g via INTRAVENOUS
  Filled 2011-05-04: qty 20

## 2011-05-04 MED ORDER — ALBUMIN HUMAN 5 % IV SOLN
INTRAVENOUS | Status: DC | PRN
  Administered 2011-05-04: 15:00:00 via INTRAVENOUS

## 2011-05-04 MED ORDER — ACETAMINOPHEN 160 MG/5ML PO SOLN: 650.0000 mg | ORAL | Status: AC

## 2011-05-04 MED ORDER — METOPROLOL TARTRATE 25 MG/10 ML ORAL SUSPENSION
12.5000 mg | Freq: Two times a day (BID) | ORAL | Status: DC
  Filled 2011-05-04 (×3): qty 5

## 2011-05-04 MED ORDER — INSULIN ASPART 100 UNIT/ML ~~LOC~~ SOLN
0.0000 [IU] | SUBCUTANEOUS | Status: DC
  Administered 2011-05-05: 2 [IU] via SUBCUTANEOUS

## 2011-05-04 MED ORDER — VANCOMYCIN HCL 1000 MG IV SOLR
1000.0000 mg | Freq: Once | INTRAVENOUS | Status: AC
  Administered 2011-05-04: 1000 mg via INTRAVENOUS
  Filled 2011-05-04: qty 1000

## 2011-05-04 MED ORDER — SODIUM CHLORIDE 0.9 % IJ SOLN
3.0000 mL | Freq: Two times a day (BID) | INTRAMUSCULAR | Status: DC
  Administered 2011-05-05 – 2011-05-07 (×5): 3 mL via INTRAVENOUS

## 2011-05-04 MED ORDER — ACETAMINOPHEN 500 MG PO TABS
1000.0000 mg | ORAL_TABLET | Freq: Four times a day (QID) | ORAL | Status: DC
  Administered 2011-05-05 – 2011-05-08 (×14): 1000 mg via ORAL
  Filled 2011-05-04 (×18): qty 2

## 2011-05-04 MED ORDER — FENTANYL CITRATE 0.05 MG/ML IJ SOLN
INTRAMUSCULAR | Status: DC | PRN
  Administered 2011-05-04: 250 ug via INTRAVENOUS
  Administered 2011-05-04: 500 ug via INTRAVENOUS
  Administered 2011-05-04: 50 ug via INTRAVENOUS
  Administered 2011-05-04: 400 ug via INTRAVENOUS
  Administered 2011-05-04: 50 ug via INTRAVENOUS

## 2011-05-04 MED ORDER — SODIUM CHLORIDE 0.9 % IV SOLN
INTRAVENOUS | Status: DC
  Filled 2011-05-04: qty 1

## 2011-05-04 MED ORDER — SODIUM CHLORIDE 0.9 % IJ SOLN
INTRAMUSCULAR | Status: DC | PRN
  Administered 2011-05-04: 12:00:00

## 2011-05-04 MED ORDER — PAPAVERINE HCL 30 MG/ML IJ SOLN
INTRAMUSCULAR | Status: DC | PRN
  Administered 2011-05-04: 60 mg via INTRAVENOUS

## 2011-05-04 MED ORDER — METOPROLOL TARTRATE 1 MG/ML IV SOLN: 2.5000 mg | INTRAVENOUS | Status: DC | PRN

## 2011-05-04 MED ORDER — SODIUM CHLORIDE 0.45 % IV SOLN
INTRAVENOUS | Status: DC
  Administered 2011-05-04: 17:00:00 via INTRAVENOUS

## 2011-05-04 MED ORDER — FAMOTIDINE IN NACL 20-0.9 MG/50ML-% IV SOLN
20.0000 mg | Freq: Two times a day (BID) | INTRAVENOUS | Status: AC
  Administered 2011-05-04: 20 mg via INTRAVENOUS
  Filled 2011-05-04: qty 50

## 2011-05-04 MED ORDER — SODIUM CHLORIDE 0.9 % IV SOLN
200.0000 ug | INTRAVENOUS | Status: DC | PRN
  Administered 2011-05-04: 0.2 ug/kg/h via INTRAVENOUS

## 2011-05-04 MED ORDER — SODIUM CHLORIDE 0.9 % IV SOLN: INTRAVENOUS | Status: DC

## 2011-05-04 MED ORDER — MORPHINE SULFATE 4 MG/ML IJ SOLN
2.0000 mg | INTRAMUSCULAR | Status: DC | PRN
  Administered 2011-05-04 – 2011-05-05 (×3): 2 mg via INTRAVENOUS
  Administered 2011-05-05 (×2): 4 mg via INTRAVENOUS
  Administered 2011-05-05: 2 mg via INTRAVENOUS
  Administered 2011-05-05: 4 mg via INTRAVENOUS
  Filled 2011-05-04 (×5): qty 1

## 2011-05-04 MED ORDER — LACTATED RINGERS IV SOLN: 500.0000 mL | Freq: Once | INTRAVENOUS | Status: AC | PRN

## 2011-05-04 MED ORDER — PHENYLEPHRINE HCL 10 MG/ML IJ SOLN
0.0000 ug/min | INTRAVENOUS | Status: DC
  Administered 2011-05-04: 25 ug/min via INTRAVENOUS
  Administered 2011-05-05 – 2011-05-06 (×3): 35 ug/min via INTRAVENOUS
  Filled 2011-05-04 (×4): qty 2

## 2011-05-04 MED ORDER — ONDANSETRON HCL 4 MG/2ML IJ SOLN: 4.0000 mg | Freq: Four times a day (QID) | INTRAMUSCULAR | Status: DC | PRN

## 2011-05-04 SURGICAL SUPPLY — 98 items
ADAPTER CARDIO PERF ANTE/RETRO (ADAPTER) IMPLANT
ADPR PRFSN 84XANTGRD RTRGD (ADAPTER)
APPLIER CLIP 9.375 SM OPEN (CLIP)
APR CLP SM 9.3 20 MLT OPN (CLIP)
ATTRACTOMAT 16X20 MAGNETIC DRP (DRAPES) ×2 IMPLANT
BAG DECANTER FOR FLEXI CONT (MISCELLANEOUS) ×2 IMPLANT
BANDAGE ELASTIC 4 VELCRO ST LF (GAUZE/BANDAGES/DRESSINGS) ×2 IMPLANT
BANDAGE ELASTIC 6 VELCRO ST LF (GAUZE/BANDAGES/DRESSINGS) ×2 IMPLANT
BANDAGE GAUZE ELAST BULKY 4 IN (GAUZE/BANDAGES/DRESSINGS) ×2 IMPLANT
BASKET HEART (ORDER IN 25'S) (MISCELLANEOUS) ×1
BASKET HEART (ORDER IN 25S) (MISCELLANEOUS) ×1 IMPLANT
BLADE SAW STERNAL (BLADE) ×2 IMPLANT
BLADE SURG 11 STRL SS (BLADE) IMPLANT
CANISTER SUCTION 2500CC (MISCELLANEOUS) ×2 IMPLANT
CANN PRFSN .5XCNCT 15X34-48 (MISCELLANEOUS) ×1
CANNULA GUNDRY RCSP 15FR (MISCELLANEOUS) IMPLANT
CANNULA PRFSN .5XCNCT 15X34-48 (MISCELLANEOUS) ×1 IMPLANT
CANNULA VEN 2 STAGE (MISCELLANEOUS) ×2
CATH CPB KIT HENDRICKSON (MISCELLANEOUS) ×2 IMPLANT
CATH ROBINSON RED A/P 18FR (CATHETERS) ×4 IMPLANT
CATH THORACIC 36FR (CATHETERS) ×1 IMPLANT
CATH THORACIC 36FR RT ANG (CATHETERS) ×2 IMPLANT
CLIP APPLIE 9.375 SM OPEN (CLIP) IMPLANT
CLIP FOGARTY SPRING 6M (CLIP) ×1 IMPLANT
CLIP TI MEDIUM 24 (CLIP) IMPLANT
CLIP TI WIDE RED SMALL 24 (CLIP) ×6 IMPLANT
CLOTH BEACON ORANGE TIMEOUT ST (SAFETY) ×2 IMPLANT
CONN Y 3/8X3/8X3/8  BEN (MISCELLANEOUS)
CONN Y 3/8X3/8X3/8 BEN (MISCELLANEOUS) IMPLANT
COVER SURGICAL LIGHT HANDLE (MISCELLANEOUS) ×5 IMPLANT
CRADLE DONUT ADULT HEAD (MISCELLANEOUS) ×2 IMPLANT
DRAPE CARDIOVASCULAR INCISE (DRAPES) ×2
DRAPE SLUSH/WARMER DISC (DRAPES) IMPLANT
DRAPE SRG 135X102X78XABS (DRAPES) ×1 IMPLANT
DRSG COVADERM 4X14 (GAUZE/BANDAGES/DRESSINGS) ×2 IMPLANT
ELECT PAD GROUND ADT 9 (MISCELLANEOUS) IMPLANT
ELECT REM PT RETURN 9FT ADLT (ELECTROSURGICAL) ×4
ELECTRODE REM PT RTRN 9FT ADLT (ELECTROSURGICAL) ×2 IMPLANT
GLOVE BIO SURGEON STRL SZ 6 (GLOVE) ×3 IMPLANT
GLOVE BIO SURGEON STRL SZ 6.5 (GLOVE) ×2 IMPLANT
GLOVE BIO SURGEON STRL SZ7.5 (GLOVE) ×2 IMPLANT
GLOVE BIOGEL PI IND STRL 7.0 (GLOVE) IMPLANT
GLOVE BIOGEL PI INDICATOR 7.0 (GLOVE) ×3
GLOVE EUDERMIC 7 POWDERFREE (GLOVE) ×6 IMPLANT
GOWN PREVENTION PLUS XLARGE (GOWN DISPOSABLE) ×6 IMPLANT
GOWN STRL NON-REIN LRG LVL3 (GOWN DISPOSABLE) ×8 IMPLANT
HEMOSTAT POWDER SURGIFOAM 1G (HEMOSTASIS) ×6 IMPLANT
HEMOSTAT SURGICEL 2X14 (HEMOSTASIS) ×2 IMPLANT
INSERT FOGARTY XLG (MISCELLANEOUS) IMPLANT
KIT BASIN OR (CUSTOM PROCEDURE TRAY) ×2 IMPLANT
KIT PAIN CUSTOM (MISCELLANEOUS) IMPLANT
KIT ROOM TURNOVER OR (KITS) ×2 IMPLANT
KIT SUCTION CATH 14FR (SUCTIONS) ×4 IMPLANT
KIT VASOVIEW 6 PRO VH 2400 (KITS) ×2 IMPLANT
MARKER GRAFT CORONARY BYPASS (MISCELLANEOUS) ×6 IMPLANT
NS IRRIG 1000ML POUR BTL (IV SOLUTION) ×11 IMPLANT
PACK OPEN HEART (CUSTOM PROCEDURE TRAY) ×2 IMPLANT
PAD ARMBOARD 7.5X6 YLW CONV (MISCELLANEOUS) ×4 IMPLANT
PENCIL BUTTON HOLSTER BLD 10FT (ELECTRODE) ×2 IMPLANT
PUNCH AORTIC ROTATE 4.0MM (MISCELLANEOUS) ×1 IMPLANT
PUNCH AORTIC ROTATE 4.5MM 8IN (MISCELLANEOUS) IMPLANT
PUNCH AORTIC ROTATE 5MM 8IN (MISCELLANEOUS) IMPLANT
SET CARDIOPLEGIA MPS 5001102 (MISCELLANEOUS) ×1 IMPLANT
SPONGE GAUZE 4X4 12PLY (GAUZE/BANDAGES/DRESSINGS) ×2 IMPLANT
SPONGE GAUZE 4X4 FOR O.R. (GAUZE/BANDAGES/DRESSINGS) ×2 IMPLANT
SPONGE LAP 4X18 X RAY DECT (DISPOSABLE) ×1 IMPLANT
SUT MNCRL AB 4-0 PS2 18 (SUTURE) IMPLANT
SUT PROLENE 3 0 SH DA (SUTURE) ×2 IMPLANT
SUT PROLENE 4 0 RB 1 (SUTURE)
SUT PROLENE 4 0 SH DA (SUTURE) IMPLANT
SUT PROLENE 4-0 RB1 .5 CRCL 36 (SUTURE) IMPLANT
SUT PROLENE 6 0 C 1 30 (SUTURE) ×5 IMPLANT
SUT PROLENE 7 0 BV 1 (SUTURE) ×4 IMPLANT
SUT PROLENE 7 0 BV1 MDA (SUTURE) ×4 IMPLANT
SUT PROLENE 8 0 BV175 6 (SUTURE) ×3 IMPLANT
SUT SILK  1 MH (SUTURE) ×1
SUT SILK 1 MH (SUTURE) ×1 IMPLANT
SUT STEEL 6MS V (SUTURE) ×1 IMPLANT
SUT STEEL STERNAL CCS#1 18IN (SUTURE) IMPLANT
SUT STEEL SZ 6 DBL 3X14 BALL (SUTURE) ×1 IMPLANT
SUT VIC AB 1 CTX 36 (SUTURE) ×4
SUT VIC AB 1 CTX36XBRD ANBCTR (SUTURE) ×2 IMPLANT
SUT VIC AB 2-0 CT1 27 (SUTURE) ×4
SUT VIC AB 2-0 CT1 TAPERPNT 27 (SUTURE) IMPLANT
SUT VIC AB 2-0 CTX 27 (SUTURE) IMPLANT
SUT VIC AB 3-0 SH 27 (SUTURE)
SUT VIC AB 3-0 SH 27X BRD (SUTURE) IMPLANT
SUT VIC AB 3-0 X1 27 (SUTURE) ×2 IMPLANT
SUT VICRYL 4-0 PS2 18IN ABS (SUTURE) IMPLANT
SUTURE E-PAK OPEN HEART (SUTURE) ×2 IMPLANT
SYSTEM SAHARA CHEST DRAIN ATS (WOUND CARE) ×2 IMPLANT
TAPE CLOTH SURG 4X10 WHT LF (GAUZE/BANDAGES/DRESSINGS) ×3 IMPLANT
TOWEL OR 17X24 6PK STRL BLUE (TOWEL DISPOSABLE) ×4 IMPLANT
TOWEL OR 17X26 10 PK STRL BLUE (TOWEL DISPOSABLE) ×6 IMPLANT
TRAY FOLEY IC TEMP SENS 14FR (CATHETERS) ×2 IMPLANT
TUBING INSUFFLATION 10FT LAP (TUBING) ×2 IMPLANT
UNDERPAD 30X30 INCONTINENT (UNDERPADS AND DIAPERS) ×2 IMPLANT
WATER STERILE IRR 1000ML POUR (IV SOLUTION) ×4 IMPLANT

## 2011-05-04 NOTE — Anesthesia Postprocedure Evaluation (Signed)
  Anesthesia Post-op Note  Patient: Samuel Hawkins  Procedure(s) Performed:  CORONARY ARTERY BYPASS GRAFTING (CABG) - CABG x  six;  using bilateral  internal mammary arteries and left leg greater saphenous vein harvested endoscopically  Patient Location: ICU  Anesthesia Type: General  Level of Consciousness: sedated  Airway and Oxygen Therapy: Patient remains intubated per anesthesia plan  Post-op Pain: none  Post-op Assessment: Post-op Vital signs reviewed, Patient's Cardiovascular Status Stable, Respiratory Function Stable and Patent Airway  Post-op Vital Signs: Reviewed and stable  Complications: No apparent anesthesia complications

## 2011-05-04 NOTE — Transfer of Care (Signed)
Immediate Anesthesia Transfer of Care Note  Patient: Samuel Hawkins  Procedure(s) Performed:  CORONARY ARTERY BYPASS GRAFTING (CABG) - CABG x  six;  using bilateral  internal mammary arteries and left leg greater saphenous vein harvested endoscopically  Patient Location: ICU  Anesthesia Type: General  Level of Consciousness: sedated and Patient remains intubated per anesthesia plan  Airway & Oxygen Therapy: Patient remains intubated per anesthesia plan and Patient placed on Ventilator (see vital sign flow sheet for setting)  Post-op Assessment: Post -op Vital signs reviewed and stable and report to ICU RN- [id number]  Post vital signs: Reviewed and stable  Complications: No apparent anesthesia complications

## 2011-05-04 NOTE — Progress Notes (Signed)
   CARE MANAGEMENT NOTE 05/04/2011  Patient:  Samuel Hawkins, Samuel Hawkins   Account Number:  [id number]  Date Initiated:  05/04/2011  Documentation initiated by:  GRAVES-BIGELOW,Beckhem Isadore  Subjective/Objective Assessment:   Pt admitted with cp/syncope. TX from [facility] ED. Plan for Cabg 05-04-11.     Action/Plan:   Anticipated DC Date:  05/11/2011   Anticipated DC Plan:  HOME W HOME HEALTH SERVICES      DC Planning Services  CM consult      Choice offered to / List presented to:             Status of service:   Medicare Important Message given?   (If response is "NO", the following Medicare IM given date fields will be blank) Date Medicare IM given:   Date Additional Medicare IM given:    Discharge Disposition:    Per UR Regulation:    Comments:  05-04-11 [id number] Tomi Bamberger, RN,BSN [phone] CM will continue to monitor for d/c disposition.

## 2011-05-04 NOTE — Anesthesia Preprocedure Evaluation (Addendum)
Anesthesia Evaluation  Patient identified by MRN, date of birth, ID band Patient awake    Reviewed: Allergy & Precautions, H&P , NPO status , Patient's Chart, lab work & pertinent test results  Airway Mallampati: II TM Distance: >3 FB Neck ROM: full    Dental  (+) Teeth Intact and Dental Advidsory Given   Pulmonary          Cardiovascular + CAD Bradycardia    Neuro/Psych    GI/Hepatic   Endo/Other    Renal/GU      Musculoskeletal   Abdominal   Peds  Hematology   Anesthesia Other Findings   Reproductive/Obstetrics                          Anesthesia Physical Anesthesia Plan  ASA: III  Anesthesia Plan: General   Post-op Pain Management:    Induction: Intravenous  Airway Management Planned: Oral ETT  Additional Equipment: Arterial line, CVP and PA Cath  Intra-op Plan:   Post-operative Plan:   Informed Consent: I have reviewed the patients History and Physical, chart, labs and discussed the procedure including the risks, benefits and alternatives for the proposed anesthesia with the patient or authorized representative who has indicated his/her understanding and acceptance.     Plan Discussed with: CRNA and Surgeon  Anesthesia Plan Comments:         Anesthesia Quick Evaluation

## 2011-05-04 NOTE — Progress Notes (Signed)
1 hour post extubation blood gas done with pH 7.307, CO2 44.7, O2 116, and bicarb 22.3. Dr Cornelius Moras made aware. No new order recieved.

## 2011-05-04 NOTE — Progress Notes (Signed)
TCTS BRIEF SICU PROGRESS NOTE  Day of Surgery  S/P Procedure(s) (LRB): CORONARY ARTERY BYPASS GRAFTING (CABG) (N/A)   Waking up on vent Stable hemodynamics Chest tube output low UOP adequate  Plan: Continue routine early postop  Yizel Canby H 05/04/2011 7:30 PM

## 2011-05-04 NOTE — Preoperative (Signed)
Beta Blockers   Reason not to administer Beta Blockers:Hold  beta blocker due to Bradycardia (HR less than 50 bpm) 

## 2011-05-04 NOTE — Procedures (Signed)
Extubation Procedure Note  Patient Details:   Name: Samuel Hawkins DOB: 1955/08/21 MRN: [id number]   Airway Documentation:     Evaluation  O2 sats: stable throughout Complications: No apparent complications Patient did tolerate procedure well. Bilateral Breath Sounds: Clear upper lobes, somewhat diminished in the bases   Yes  Filbert Schilder 05/04/2011, 8:47 PM  Patient was weaned, performed SBT, coached on deep breathing, cough, and was extubated to a 4 L nasal cannula.  No evidence of stridor present.  NIF -24 VC 1.3 L

## 2011-05-04 NOTE — Anesthesia Procedure Notes (Signed)
Procedure Name: Intubation Date/Time: 05/04/2011 9:55 AM Performed by: Rosita Fire Pre-anesthesia Checklist: Patient identified, Emergency Drugs available, Suction available, Patient being monitored and Timeout performed Patient Re-evaluated:Patient Re-evaluated prior to inductionOxygen Delivery Method: Circle System Utilized Preoxygenation: Pre-oxygenation with 100% oxygen Intubation Type: IV induction Ventilation: Mask ventilation without difficulty and Oral airway inserted - appropriate to patient size Laryngoscope Size: Mac and 3 Grade View: Grade II Tube type: Oral Tube size: 8.0 mm Number of attempts: 1 Airway Equipment and Method: stylet Placement Confirmation: ETT inserted through vocal cords under direct vision,  positive ETCO2 and breath sounds checked- equal and bilateral Secured at: 23 cm Tube secured with: Tape Dental Injury: Teeth and Oropharynx as per pre-operative assessment

## 2011-05-04 NOTE — Brief Op Note (Signed)
04/28/2011 - 05/04/2011  4:10 PM  PATIENT:  Samuel Hawkins  55 y.o. male  PRE-OPERATIVE DIAGNOSIS:  CAD  POST-OPERATIVE DIAGNOSIS:  CAD  PROCEDURE:  Procedure(s): CORONARY ARTERY BYPASS GRAFTING (CABG)  SURGEON:  Surgeon(s): Loreli Slot, MD  PHYSICIAN ASSISTANT: Gershon Crane, PA  ASSISTANTS: Gershon Crane, [place]   ANESTHESIA:   general  EBL:  Total I/O In: 3550 [I.V.:3050; Blood:500] Out: 3450 [Urine:1750; Blood:1700]  BLOOD ADMINISTERED:none  DRAINS: 3 Chest Tube(s) in the med, R & L pleura   LOCAL MEDICATIONS USED:  NONE  SPECIMEN:  No Specimen  DISPOSITION OF SPECIMEN:  N/A  COUNTS:  ACTION TAKEN: X-RAY(S) TAKEN in SICU  TOURNIQUET:  * No tourniquets in log *  DICTATION: .Other Dictation: Dictation Number -  PLAN OF CARE: Admit to inpatient   PATIENT DISPOSITION:  ICU - intubated and hemodynamically stable.   Delay start of Pharmacological VTE agent (>24hrs) due to surgical blood loss or risk of bleeding:  YES  XC 96 min CPB 140 min

## 2011-05-05 ENCOUNTER — Inpatient Hospital Stay (HOSPITAL_COMMUNITY): Payer: Managed Care, Other (non HMO)

## 2011-05-05 ENCOUNTER — Encounter (HOSPITAL_COMMUNITY): Payer: Self-pay | Admitting: Thoracic Surgery (Cardiothoracic Vascular Surgery)

## 2011-05-05 ENCOUNTER — Other Ambulatory Visit: Payer: Self-pay

## 2011-05-05 LAB — GLUCOSE, CAPILLARY
Glucose-Capillary: 91 mg/dL (ref 70–99)
Glucose-Capillary: 108 mg/dL — ABNORMAL HIGH (ref 70–99)
Glucose-Capillary: 115 mg/dL — ABNORMAL HIGH (ref 70–99)
Glucose-Capillary: 124 mg/dL — ABNORMAL HIGH (ref 70–99)
Glucose-Capillary: 94 mg/dL (ref 70–99)

## 2011-05-05 LAB — MRSA PCR SCREENING: MRSA by PCR: NEGATIVE

## 2011-05-05 LAB — CBC
MCH: 30.7 pg (ref 26.0–34.0)
MCV: 86.4 fL (ref 78.0–100.0)
Platelets: 249 10*3/uL (ref 150–400)
Platelets: 193 10*3/uL (ref 150–400)
RBC: 3.45 MIL/uL — ABNORMAL LOW (ref 4.22–5.81)
RBC: 3.11 MIL/uL — ABNORMAL LOW (ref 4.22–5.81)
RDW: 12.6 % (ref 11.5–15.5)
RDW: 12.7 % (ref 11.5–15.5)
WBC: 11.1 10*3/uL — ABNORMAL HIGH (ref 4.0–10.5)

## 2011-05-05 LAB — MAGNESIUM
Magnesium: 2.3 mg/dL (ref 1.5–2.5)
Magnesium: 2.1 mg/dL (ref 1.5–2.5)

## 2011-05-05 LAB — POCT I-STAT, CHEM 8
BUN: 13 mg/dL (ref 6–23)
Calcium, Ion: 1.15 mmol/L (ref 1.12–1.32)
Chloride: 102 mEq/L (ref 96–112)
Creatinine, Ser: 1 mg/dL (ref 0.50–1.35)
Glucose, Bld: 111 mg/dL — ABNORMAL HIGH (ref 70–99)
TCO2: 26 mmol/L (ref 0–100)

## 2011-05-05 LAB — BASIC METABOLIC PANEL
CO2: 23 mEq/L (ref 19–32)
Calcium: 7.5 mg/dL — ABNORMAL LOW (ref 8.4–10.5)
Creatinine, Ser: 0.89 mg/dL (ref 0.50–1.35)
GFR calc Af Amer: >90 mL/min (ref >90)
GFR calc non Af Amer: >90 mL/min (ref >90)
Sodium: 136 mEq/L (ref 135–145)

## 2011-05-05 LAB — CREATININE, SERUM
Creatinine, Ser: 0.97 mg/dL (ref 0.50–1.35)
GFR calc Af Amer: >90 mL/min (ref >90)
GFR calc non Af Amer: >90 mL/min (ref >90)

## 2011-05-05 MED ORDER — SODIUM CHLORIDE 0.9 % IV SOLN
350.0000 mg | Freq: Once | INTRAVENOUS | Status: AC
  Administered 2011-05-05: 350 mg via INTRAVENOUS
  Filled 2011-05-05: qty 3.5

## 2011-05-05 MED ORDER — MIDAZOLAM HCL 2 MG/2ML IJ SOLN
2.0000 mg | Freq: Once | INTRAMUSCULAR | Status: AC
  Administered 2011-05-05: 2 mg via INTRAVENOUS
  Filled 2011-05-05: qty 2

## 2011-05-05 MED ORDER — FUROSEMIDE 10 MG/ML IJ SOLN
40.0000 mg | Freq: Once | INTRAMUSCULAR | Status: AC
  Administered 2011-05-05: 40 mg via INTRAVENOUS
  Filled 2011-05-05: qty 4

## 2011-05-05 MED ORDER — INSULIN ASPART 100 UNIT/ML ~~LOC~~ SOLN
0.0000 [IU] | SUBCUTANEOUS | Status: DC
  Administered 2011-05-06: 2 [IU] via SUBCUTANEOUS

## 2011-05-05 MED ORDER — METOCLOPRAMIDE HCL 5 MG/ML IJ SOLN
10.0000 mg | Freq: Four times a day (QID) | INTRAMUSCULAR | Status: AC
  Administered 2011-05-05 – 2011-05-06 (×4): 10 mg via INTRAVENOUS
  Filled 2011-05-05 (×4): qty 2

## 2011-05-05 MED ORDER — DOPAMINE-DEXTROSE 3.2-5 MG/ML-% IV SOLN
0.0000 ug/kg/min | INTRAVENOUS | Status: DC
Start: 2011-05-05 — End: 2011-05-06

## 2011-05-05 MED ORDER — POTASSIUM CHLORIDE 10 MEQ/50ML IV SOLN
10.0000 meq | INTRAVENOUS | Status: AC
  Administered 2011-05-05 (×2): 10 meq via INTRAVENOUS
  Filled 2011-05-05: qty 100

## 2011-05-05 MED ORDER — KETOROLAC TROMETHAMINE 30 MG/ML IJ SOLN
30.0000 mg | Freq: Four times a day (QID) | INTRAMUSCULAR | Status: DC
  Administered 2011-05-05 – 2011-05-07 (×8): 30 mg via INTRAVENOUS
  Filled 2011-05-05 (×12): qty 1

## 2011-05-05 MED ORDER — PANTOPRAZOLE SODIUM 40 MG PO TBEC
40.0000 mg | DELAYED_RELEASE_TABLET | Freq: Every day | ORAL | Status: DC
  Administered 2011-05-05 – 2011-05-08 (×4): 40 mg via ORAL
  Filled 2011-05-05 (×4): qty 1

## 2011-05-05 MED ORDER — ALBUMIN HUMAN 5 % IV SOLN
12.5000 g | Freq: Once | INTRAVENOUS | Status: AC
  Administered 2011-05-05: 12.5 g via INTRAVENOUS
  Filled 2011-05-05: qty 250

## 2011-05-05 NOTE — Progress Notes (Signed)
Patient ID: Antionio Negron, male   DOB: 06/29/1955, 55 y.o.   MRN: [id number]  Filed Vitals:   05/05/11 1600 05/05/11 1700 05/05/11 1800 05/05/11 2003  BP: 98/52 106/57 97/64   Pulse: 90 90 90   Temp:    98.4 F (36.9 C)  TempSrc:    Oral  Resp: 15 18 17    Height:      Weight:      SpO2: 99% 100% 100%    On dopamine 1.26mcg, neo 35 mcg.  Excellent diuresis   BMET    Component Value Date/Time   NA 140 05/05/2011 1701   K 3.9 05/05/2011 1701   CL 102 05/05/2011 1701   CO2 23 05/05/2011 0352   GLUCOSE 111* 05/05/2011 1701   GLUCOSE 97 04/30/2006 0816   BUN 13 05/05/2011 1701   CREATININE 1.00 05/05/2011 1701   CALCIUM 7.5* 05/05/2011 0352   GFRNONAA >90 05/05/2011 1700   GFRAA >90 05/05/2011 1700    CBC    Component Value Date/Time   WBC 11.1* 05/05/2011 1700   RBC 3.11* 05/05/2011 1700   HGB 9.2* 05/05/2011 1701   HCT 27.0* 05/05/2011 1701   PLT 193 05/05/2011 1700   MCV 87.1 05/05/2011 1700   MCH 31.2 05/05/2011 1700   MCHC 35.8 05/05/2011 1700   RDW 12.7 05/05/2011 1700   LYMPHSABS 2.9 07/10/2010 1146   MONOABS 0.6 07/10/2010 1146   EOSABS 0.1 07/10/2010 1146   BASOSABS 0.0 07/10/2010 1146    A/P:  Postop vasodilitation s/p CABG.  Wean vasopressors as tolerated

## 2011-05-05 NOTE — Progress Notes (Signed)
1 Day Post-Op Procedure(s) (LRB): CORONARY ARTERY BYPASS GRAFTING (CABG) (N/A) Subjective: C/o incisional pain  Objective: Vital signs in last 24 hours: Temp:  [96.1 F (35.6 C)-100 F (37.8 C)] 99.7 F (37.6 C) (12/18 0800) Pulse Rate:  [61-94] 90  (12/18 0800) Cardiac Rhythm:  [-] Normal sinus rhythm;Atrial paced (12/18 0730) Resp:  [12-37] 36  (12/18 0800) BP: (72-113)/(33-68) 100/59 mmHg (12/18 0800) SpO2:  [90 %-100 %] 99 % (12/18 0800) Arterial Line BP: (80-141)/(41-80) 96/50 mmHg (12/18 0800) FiO2 (%):  [39.6 %-50.6 %] 40 % (12/17 2030) Weight:  [81.6 kg (179 lb 14.3 oz)] 179 lb 14.3 oz (81.6 kg) (12/18 0600)  Hemodynamic parameters for last 24 hours: PAP: (17-62)/(7-26) 36/13 mmHg CO:  [4 L/min-5.5 L/min] 5.5 L/min CI:  [2.2 L/min/m2-3 L/min/m2] 3 L/min/m2  Intake/Output from previous day: 12/17 0701 - 12/18 0700 In: 7182.1 [I.V.:5114.1; Blood:500; NG/GT:30; IV Piggyback:1538] Out: 7400 [Urine:4930; Blood:1700; Chest Tube:770] Intake/Output this shift: Total I/O In: 65 [I.V.:65] Out: 60 [Urine:60]  General appearance: alert and no distress Neurologic: intact Heart: regular rate and rhythm Lungs: diminished breath sounds bibasilar and bilaterally Abdomen: abnormal findings:  distended and minimal BS  Lab Results:  Basename 05/05/11 0352 05/04/11 2225  WBC 11.6* 14.2*  HGB 10.6* 11.8*  HCT 29.8* 32.9*  PLT 249 252   BMET:  Basename 05/05/11 0352 05/04/11 2225 05/04/11 2221  NA 136 -- 142  K 4.5 -- 5.3*  CL 106 -- 108  CO2 23 -- --  GLUCOSE 131* -- 145*  BUN 10 -- 9  CREATININE 0.89 0.87 --  CALCIUM 7.5* -- --    PT/INR:  Basename 05/04/11 1650  LABPROT 18.8*  INR 1.54*   ABG    Component Value Date/Time   PHART 7.307* 05/04/2011 2123   HCO3 22.3 05/04/2011 2123   TCO2 22 05/04/2011 2221   ACIDBASEDEF 4.0* 05/04/2011 2123   O2SAT 98.0 05/04/2011 2123   CBG (last 3)   Basename 05/05/11 0731 05/05/11 0328 05/04/11 2303  GLUCAP 108* 125*  130*    Assessment/Plan: S/P Procedure(s) (LRB): CORONARY ARTERY BYPASS GRAFTING (CABG) (N/A) Mobilize Diuresis d/c tubes/lines CV: stable, still on some Neo,  D/c swan Resp: pulmonary toilet Renal: diurese Anemia: mild, sec to acute blood loss D/C CT OOB   LOS: 7 days    Kryssa Risenhoover C 05/05/2011

## 2011-05-05 NOTE — Op Note (Signed)
NAMEGUNNAR, Samuel Hawkins                 ACCOUNT NO.:  [id number]  MEDICAL RECORD NO.:  [id number]  LOCATION:  2305                         FACILITY:  [facility]  PHYSICIAN:  Salvatore Decent. Calisha Tindel, M.D.DATE OF BIRTH:  Mar 02, 1956  DATE OF PROCEDURE:05/04/2011 DATE OF DISCHARGE:                              OPERATIVE REPORT   PREOPERATIVE DIAGNOSIS:  Severe three-vessel coronary disease with unstable angina.  POSTOPERATIVE DIAGNOSIS:  Severe three-vessel coronary disease with unstable angina.  PROCEDURE:  Median sternotomy, extracorporeal circulation, coronary bypass grafting x6 (left internal mammary artery to LAD, right internal mammary artery to distal right coronary, sequential saphenous vein graft to ramus intermedius and obtuse marginal 1, sequential saphenous vein graft to obtuse marginals 3 and 4), combination of open and endoscopic vein harvest, left thigh.  SURGEON:  Salvatore Decent. Dorris Fetch, MD  ASSISTANT:  Rowe Clack, PA-C  ANESTHESIA:  General.  FINDINGS:  Internal mammary arteries good quality. Saphenous vein fair quality, multiple relatively superficial small branches required a combination of open and endoscopic harvest.  LAD and distal right good quality targets.  Lateral wall target vessels relatively small, but free of significant disease at the site of anastomosis.  CLINICAL NOTE:  Samuel Hawkins is a 55 year old gentleman who presents with new-onset chest pain.  At cardiac catheterization, he was found to have severe three-vessel coronary disease and was referred for coronary artery bypass grafting.  The indications, risks, benefits, and alternatives were discussed in detail with the patient.  He understood and accepted the risks and agreed to proceed.  OPERATIVE NOTE:  Samuel Hawkins was brought to the preoperative holding area on May 04, 2011.  There, the Anesthesia Service placed a Swan-Ganz catheter and arterial blood pressure monitoring line.   Intravenous antibiotics were administered.  He was taken to the operating room, anesthetized, and intubated.  A Foley catheter was placed.  The chest, abdomen, and legs were prepped and draped in usual sterile fashion. Median sternotomy was performed, and the left internal mammary artery was harvested using standard technique.  Simultaneously, an incision was made in the medial aspect of the right leg, just below the knee.  The greater saphenous vein was identified.  It was very small at that site and very superficial.  An incision was made in the medial aspect of the left leg, just below the level of the knee and this vein at this site was also small and superficial.  A second incision was made just above the knee.  The vein was identified there and likewise was small and superficial at that site.  Decision was made to proceed with bridged incisions with open harvest of the vein.  In the mid thigh, the vein did become large enough and deep enough to use the endoscopic approach up to the level of the groin.  2000 units of heparin was administered during the vessel harvest.  After harvesting the left internal mammary artery, the right internal mammary artery then was harvested again using standard technique.  It likewise was a good quality vessel.  After harvesting the conduits and comparing them, the remainder of the full heparin dose was given.  The pericardium was opened.  The ascending aorta  was inspected.  There was no atherosclerotic disease.  The aorta was cannulated via concentric 2-0 Ethibond pledgeted pursestring sutures.  A dual-stage venous cannula was placed via pursestring suture in the right atrial appendage. Cardiopulmonary bypass was instituted, and the patient was cooled to 32 degrees Celsius.  The coronary arteries were inspected, and anastomotic sites were chosen.  The conduits were inspected and cut to length.  A foam pad was placed in the pericardium to insulate the  heart and protect the left phrenic nerve.  A temperature probe was placed in the myocardial septum, and a cardioplegic cannula was placed in the ascending aorta.  The aorta was crossclamped.  The left ventricle was emptied via the aortic root vent.  Cardiac arrest then was achieved with a combination of cold antegrade blood cardioplegia and topical iced saline.  A total of 1.5 L of cardioplegia was administered.  The myocardial septal temperature fell to 10 degrees Celsius.  The following distal anastomoses were performed.  First, a reversed saphenous vein graft was placed sequentially to obtuse marginals 3 and 4.  These were the largest of the distal lateral branches.  They both did accept a 1.5-mm probe.  The vein was anastomosed side-to-side to OM-3 and end-to-side to OM-4, both were done with running 7-0 Prolene sutures.  All anastomoses were probed proximally and distally at their completion to ensure patency. Cardioplegia was administered at the completion of each vein graft to assess flow and hemostasis.  Next, a reversed saphenous vein graft was placed sequentially to the ramus intermedius and obtuse marginal 1.  Ramus was a 1.5-mm target vessel.  OM-1 was a 1.5-mm target vessel that was superficially intramyocardial.  Again, anastomoses were performed with running 7-0 Prolene sutures.  There was good flow through this graft as well.  Next, the right internal mammary artery was brought through a window in the pericardium.  It reached easily to the distal right coronary.  It was beveled and anastomosed end-to-side to the distal right coronary with a running 8-0 Prolene suture.  At the completion of the anastomosis, the bulldog clamp was briefly removed to inspect for hemostasis.  Then, it was replaced.  The mammary pedicle was tacked to the epicardial surface of the heart with 6-0 Prolene sutures.  Additional cardioplegia was administered via the aortic root.  The  left internal mammary artery was brought through a window in the pericardium. The distal end was beveled.  It was anastomosed end-to-side to the distal LAD.  The LAD was a 2 mm good quality target.  The mammary was a good quality conduit.  Anastomosis was performed with a running 8-0 Prolene suture.  At the completion of the anastomosis, there was good hemostasis when the bulldog clamp was removed and septal rewarming was noted.  The bulldog clamp was replaced, and the mammary pedicle was tacked to the epicardial surface of the heart with 6-0 Prolene sutures.  Additional cardioplegia was administered.  The vein grafts were cut to length.  The cardioplegic cannula then was removed from the ascending aorta.  The proximal vein graft anastomoses were performed to 4.0-mm punch aortotomies with running 6-0 Prolene sutures.  At the completion of final proximal anastomosis, the patient was placed in Trendelenburg position.  Lidocaine was administered.  The aortic root was de-aired, and the bulldog clamp was removed from both mammary arteries.  After de- airing the aortic root, the aortic crossclamp was removed.  The total crossclamp time was 96 minutes.  The patient spontaneously  resumed sinus rhythm.  He did not require defibrillation.  While rewarming was completed, all proximal and distal anastomoses were inspected for hemostasis.  Epicardial pacing wires were placed on the right ventricle and right atrium.  When the patient had rewarmed to a core temperature of 37 degrees Celsius, he was weaned from cardiopulmonary bypass on the first attempt with a total bypass time of 140 minutes.  The initial cardiac index was greater than 2 L/min/m2, and the patient remained hemodynamically stable throughout the postbypass.  A test dose protamine was administered and was well tolerated.  The atrial and aortic cannulae were removed.  The remainder of the protamine was administered without incident.   Chest was irrigated with 1 L of warm normal saline.  Hemostasis was achieved.  The pericardium was reapproximated with interrupted 3-0 silk sutures easily without tension. Bilateral pleural single mediastinal chest tubes were placed through separate subcostal incisions.  The sternum was closed with a combination of single and double heavy gauge stainless steel wires.  The pectoralis fascia, subcutaneous tissue, and skin were closed in standard fashion. At the completion of the procedure, the sponge and instrument counts were correct.  There was a missing needle from the 7-0 Prolene suture, which was not accounted for.  Chest x-ray will be done in the ICU.  The patient was transported from the operating room to the surgical intensive care unit in good condition.     Salvatore Decent Dorris Fetch, M.D.     SCH/MEDQ  D:  05/04/2011  T:  05/05/2011  Job:  [id number]

## 2011-05-05 NOTE — Plan of Care (Signed)
Problem: Phase II Progression Outcomes Goal: Tolerates D/C of vasopressors Outcome: Not Progressing Currently remains on Neosynephrine and Dopamine.

## 2011-05-05 NOTE — Progress Notes (Signed)
UR Completed.  Leydi Winstead Jane [phone] 05/05/2011  

## 2011-05-06 ENCOUNTER — Inpatient Hospital Stay (HOSPITAL_COMMUNITY): Payer: Managed Care, Other (non HMO)

## 2011-05-06 LAB — BASIC METABOLIC PANEL
CO2: 27 mEq/L (ref 19–32)
Chloride: 106 mEq/L (ref 96–112)
Glucose, Bld: 99 mg/dL (ref 70–99)
Sodium: 138 mEq/L (ref 135–145)

## 2011-05-06 LAB — GLUCOSE, CAPILLARY: Glucose-Capillary: 95 mg/dL (ref 70–99)

## 2011-05-06 LAB — CBC
HCT: 25.8 % — ABNORMAL LOW (ref 39.0–52.0)
Hemoglobin: 9.2 g/dL — ABNORMAL LOW (ref 13.0–17.0)
MCH: 31 pg (ref 26.0–34.0)
MCV: 86.9 fL (ref 78.0–100.0)
RBC: 2.97 MIL/uL — ABNORMAL LOW (ref 4.22–5.81)
WBC: 11.8 10*3/uL — ABNORMAL HIGH (ref 4.0–10.5)

## 2011-05-06 MED ORDER — ALBUMIN HUMAN 5 % IV SOLN
12.5000 g | Freq: Once | INTRAVENOUS | Status: AC
  Administered 2011-05-06: 12.5 g via INTRAVENOUS
  Filled 2011-05-06: qty 250

## 2011-05-06 MED ORDER — POTASSIUM CHLORIDE 10 MEQ/50ML IV SOLN
INTRAVENOUS | Status: AC
  Administered 2011-05-06: 07:00:00
  Filled 2011-05-06: qty 50

## 2011-05-06 MED ORDER — POTASSIUM CHLORIDE 10 MEQ/50ML IV SOLN
10.0000 meq | INTRAVENOUS | Status: DC | PRN
  Administered 2011-05-06 (×2): 10 meq via INTRAVENOUS

## 2011-05-06 MED ORDER — POTASSIUM CHLORIDE 10 MEQ/50ML IV SOLN
INTRAVENOUS | Status: AC
  Filled 2011-05-06: qty 50

## 2011-05-06 MED ORDER — SODIUM CHLORIDE 0.9 % IV SOLN
700.0000 mg | Freq: Once | INTRAVENOUS | Status: AC
  Administered 2011-05-06: 700 mg via INTRAVENOUS
  Filled 2011-05-06: qty 7

## 2011-05-06 MED ORDER — POTASSIUM CHLORIDE 10 MEQ/50ML IV SOLN
INTRAVENOUS | Status: AC
  Administered 2011-05-06: 10 meq
  Filled 2011-05-06: qty 50

## 2011-05-06 MED ORDER — POTASSIUM CHLORIDE 10 MEQ/50ML IV SOLN
10.0000 meq | INTRAVENOUS | Status: AC
  Administered 2011-05-06 (×2): 10 meq via INTRAVENOUS
  Filled 2011-05-06 (×2): qty 50

## 2011-05-06 MED ORDER — ENOXAPARIN SODIUM 40 MG/0.4ML ~~LOC~~ SOLN
40.0000 mg | SUBCUTANEOUS | Status: DC
  Administered 2011-05-06 – 2011-05-07 (×2): 40 mg via SUBCUTANEOUS
  Filled 2011-05-06 (×3): qty 0.4

## 2011-05-06 MED FILL — Magnesium Sulfate Inj 50%: INTRAMUSCULAR | Qty: 10 | Status: AC

## 2011-05-06 MED FILL — Potassium Chloride Inj 2 mEq/ML: INTRAVENOUS | Qty: 40 | Status: AC

## 2011-05-06 MED FILL — Dextrose Inj 5%: INTRAVENOUS | Qty: 250 | Status: AC

## 2011-05-06 MED FILL — Dexmedetomidine HCl IV Soln 200 MCG/2ML: INTRAVENOUS | Qty: 2 | Status: AC

## 2011-05-06 MED FILL — Phenylephrine HCl Inj 10 MG/ML: INTRAMUSCULAR | Qty: 2 | Status: AC

## 2011-05-06 NOTE — Progress Notes (Signed)
Patient ID: Samuel Hawkins, male   DOB: 11/17/1955, 55 y.o.   MRN: [id number] Up in chair, no c/o BP 106/64  Pulse 84  Temp(Src) 98.6 F (37 C) (Oral)  Resp 28  Ht 5\' 7"  (1.702 m)  Wt 80.9 kg (178 lb 5.6 oz)  BMI 27.93 kg/m2  SpO2 97% Off drips  To 2000 in am

## 2011-05-06 NOTE — Progress Notes (Signed)
2 Days Post-Op Procedure(s) (LRB): CORONARY ARTERY BYPASS GRAFTING (CABG) (N/A) Subjective: I feel weak, pain better  Objective: Vital signs in last 24 hours: Temp:  [98.2 F (36.8 C)-99.9 F (37.7 C)] 98.3 F (36.8 C) (12/19 0734) Pulse Rate:  [89-90] 90  (12/19 0700) Cardiac Rhythm:  [-] Atrial paced (12/19 0400) Resp:  [14-37] 22  (12/19 0700) BP: (81-108)/(46-69) 83/51 mmHg (12/19 0700) SpO2:  [95 %-100 %] 98 % (12/19 0700) Arterial Line BP: (78-118)/(42-82) 78/48 mmHg (12/19 0700) Weight:  [80.9 kg (178 lb 5.6 oz)] 178 lb 5.6 oz (80.9 kg) (12/19 0600)  Hemodynamic parameters for last 24 hours: PAP: (29-48)/(10-18) 39/16 mmHg  Intake/Output from previous day: 12/18 0701 - 12/19 0700 In: 2406.9 [P.O.:510; I.V.:1133.4; IV Piggyback:763.5] Out: 3260 [Urine:3080; Emesis/NG output:100; Chest Tube:80] Intake/Output this shift: Total I/O In: 26.3 [I.V.:26.3] Out: -   General appearance: alert and no distress Neurologic: intact Heart: regular rate and rhythm Lungs: diminished breath sounds bibasilar Abdomen: soft, hypoactive BS  Lab Results:  Basename 05/06/11 0330 05/05/11 1701 05/05/11 1700  WBC 11.8* -- 11.1*  HGB 9.2* 9.2* --  HCT 25.8* 27.0* --  PLT 209 -- 193   BMET:  Basename 05/06/11 0330 05/05/11 1701 05/05/11 0352  NA 138 140 --  K 3.7 3.9 --  CL 106 102 --  CO2 27 -- 23  GLUCOSE 99 111* --  BUN 12 13 --  CREATININE 0.93 1.00 --  CALCIUM 7.9* -- 7.5*    PT/INR:  Basename 05/04/11 1650  LABPROT 18.8*  INR 1.54*   ABG    Component Value Date/Time   PHART 7.307* 05/04/2011 2123   HCO3 22.3 05/04/2011 2123   TCO2 26 05/05/2011 1701   ACIDBASEDEF 4.0* 05/04/2011 2123   O2SAT 98.0 05/04/2011 2123   CBG (last 3)   Basename 05/06/11 0723 05/06/11 0332 05/05/11 2340  GLUCAP 95 91 120*    Assessment/Plan: S/P Procedure(s) (LRB): CORONARY ARTERY BYPASS GRAFTING (CABG) (N/A) Mobilize CV - on Neo and dopamine for low BP- give albumin and  Calcium, wean gtts  D/c lopressor sec to brady Resp: pulmonary toilet Renal: lytes, Cr ok Anemia sec to ABL- follow D/C A line   LOS: 8 days    Vernor Monnig C 05/06/2011

## 2011-05-07 MED ORDER — MOVING RIGHT ALONG BOOK
Freq: Once | Status: DC
  Filled 2011-05-07: qty 1

## 2011-05-07 MED ORDER — INSULIN ASPART 100 UNIT/ML ~~LOC~~ SOLN
0.0000 [IU] | Freq: Three times a day (TID) | SUBCUTANEOUS | Status: DC
  Administered 2011-05-07: 2 [IU] via SUBCUTANEOUS
  Filled 2011-05-07: qty 3

## 2011-05-07 MED ORDER — SODIUM CHLORIDE 0.9 % IJ SOLN: 3.0000 mL | INTRAMUSCULAR | Status: DC | PRN

## 2011-05-07 MED ORDER — SODIUM CHLORIDE 0.9 % IV SOLN: 250.0000 mL | INTRAVENOUS | Status: DC | PRN

## 2011-05-07 MED ORDER — POVIDONE-IODINE 10 % EX SOLN
1.0000 "application " | Freq: Two times a day (BID) | CUTANEOUS | Status: DC
  Administered 2011-05-07 – 2011-05-08 (×2): 1 via TOPICAL
  Filled 2011-05-07: qty 15

## 2011-05-07 MED ORDER — ZOLPIDEM TARTRATE 5 MG PO TABS: 5.0000 mg | ORAL_TABLET | Freq: Every evening | ORAL | Status: DC | PRN

## 2011-05-07 MED ORDER — ALUM & MAG HYDROXIDE-SIMETH 200-200-20 MG/5ML PO SUSP: 15.0000 mL | ORAL | Status: DC | PRN

## 2011-05-07 MED ORDER — ALUM & MAG HYDROXIDE-SIMETH 400-400-40 MG/5ML PO SUSP: 15.0000 mL | ORAL | Status: DC | PRN

## 2011-05-07 MED ORDER — SODIUM CHLORIDE 0.9 % IJ SOLN
3.0000 mL | Freq: Two times a day (BID) | INTRAMUSCULAR | Status: DC
  Administered 2011-05-07 – 2011-05-08 (×2): 3 mL via INTRAVENOUS

## 2011-05-07 MED ORDER — KETOROLAC TROMETHAMINE 30 MG/ML IJ SOLN
30.0000 mg | Freq: Four times a day (QID) | INTRAMUSCULAR | Status: DC | PRN
  Filled 2011-05-07: qty 1

## 2011-05-07 MED FILL — Sodium Chloride IV Soln 0.9%: INTRAVENOUS | Qty: 1000 | Status: AC

## 2011-05-07 MED FILL — Electrolyte-R (PH 7.4) Solution: INTRAVENOUS | Qty: 5000 | Status: AC

## 2011-05-07 MED FILL — Sodium Chloride Irrigation Soln 0.9%: Qty: 3000 | Status: AC

## 2011-05-07 MED FILL — Heparin Sodium (Porcine) Inj 1000 Unit/ML: INTRAMUSCULAR | Qty: 60 | Status: AC

## 2011-05-07 NOTE — Progress Notes (Signed)
3 Days Post-Op Procedure(s) (LRB): CORONARY ARTERY BYPASS GRAFTING (CABG) (N/A) Subjective: No c/o this AM No nausea Pain well controlled  Objective: Vital signs in last 24 hours: Temp:  [98.1 F (36.7 C)-99.1 F (37.3 C)] 99 F (37.2 C) (12/20 0342) Pulse Rate:  [79-90] 80  (12/20 0700) Cardiac Rhythm:  [-] Atrial paced (12/20 0300) Resp:  [16-29] 26  (12/20 0700) BP: (83-117)/(49-64) 96/53 mmHg (12/20 0700) SpO2:  [89 %-100 %] 95 % (12/20 0700) Arterial Line BP: (87-136)/(46-98) 111/56 mmHg (12/19 1600) Weight:  [81.7 kg (180 lb 1.9 oz)] 180 lb 1.9 oz (81.7 kg) (12/20 0609)  Hemodynamic parameters for last 24 hours:    Intake/Output from previous day: 12/19 0701 - 12/20 0700 In: 1418.8 [P.O.:660; I.V.:401.8; IV Piggyback:357] Out: 1350 [Urine:1350] Intake/Output this shift:    General appearance: alert and no distress Neurologic: intact Heart: regular rate and rhythm Lungs: clear to auscultation bilaterally Wound: dressing clean and dry  Lab Results:  Basename 05/06/11 0330 05/05/11 1701 05/05/11 1700  WBC 11.8* -- 11.1*  HGB 9.2* 9.2* --  HCT 25.8* 27.0* --  PLT 209 -- 193   BMET:  Basename 05/06/11 0330 05/05/11 1701 05/05/11 0352  NA 138 140 --  K 3.7 3.9 --  CL 106 102 --  CO2 27 -- 23  GLUCOSE 99 111* --  BUN 12 13 --  CREATININE 0.93 1.00 --  CALCIUM 7.9* -- 7.5*    PT/INR:  Basename 05/04/11 1650  LABPROT 18.8*  INR 1.54*   ABG    Component Value Date/Time   PHART 7.307* 05/04/2011 2123   HCO3 22.3 05/04/2011 2123   TCO2 26 05/05/2011 1701   ACIDBASEDEF 4.0* 05/04/2011 2123   O2SAT 98.0 05/04/2011 2123   CBG (last 3)   Basename 05/06/11 0723 05/06/11 0332 05/05/11 2340  GLUCAP 95 91 120*    Assessment/Plan: S/P Procedure(s) (LRB): CORONARY ARTERY BYPASS GRAFTING (CABG) (N/A) Mobilize Plan for transfer to step-down: see transfer orders CV HR improved SR at 74 bpm, BP remains low- hold beta blocker, not a candidate for ACE-I at  this point, can consider as oupt. Transfer 2000 Ambulate Poassibly home tomorrow if HR, BP ok   LOS: 9 days    Samuel Hawkins C 05/07/2011

## 2011-05-07 NOTE — Plan of Care (Signed)
Problem: Phase III Progression Outcomes Goal: Transfer to PCTU/Telemetry POD Outcome: Completed/Met Date Met:  05/07/11 05/07/11 @ 1730

## 2011-05-08 ENCOUNTER — Inpatient Hospital Stay (HOSPITAL_COMMUNITY): Payer: Managed Care, Other (non HMO)

## 2011-05-08 LAB — BASIC METABOLIC PANEL
BUN: 15 mg/dL (ref 6–23)
CO2: 24 mEq/L (ref 19–32)
Chloride: 104 mEq/L (ref 96–112)
Creatinine, Ser: 1.04 mg/dL (ref 0.50–1.35)
Glucose, Bld: 93 mg/dL (ref 70–99)

## 2011-05-08 LAB — CBC
MCV: 88.2 fL (ref 78.0–100.0)
Platelets: 320 10*3/uL (ref 150–400)
RBC: 2.8 MIL/uL — ABNORMAL LOW (ref 4.22–5.81)
RDW: 12.9 % (ref 11.5–15.5)
WBC: 10.1 10*3/uL (ref 4.0–10.5)

## 2011-05-08 LAB — GLUCOSE, CAPILLARY
Glucose-Capillary: 98 mg/dL (ref 70–99)
Glucose-Capillary: 116 mg/dL — ABNORMAL HIGH (ref 70–99)

## 2011-05-08 MED ORDER — ASPIRIN 325 MG PO TBEC: 325.0000 mg | DELAYED_RELEASE_TABLET | Freq: Every day | ORAL | Status: AC

## 2011-05-08 MED ORDER — OXYCODONE HCL 5 MG PO TABS: 5.0000 mg | ORAL_TABLET | ORAL | Status: AC | PRN

## 2011-05-08 NOTE — Progress Notes (Signed)
CARDIAC REHAB PHASE I   PRE:  Rate/Rhythm: 80SR  BP:  Supine: 118/60  Sitting:   Standing:    SaO2: 98%RA  MODE:  Ambulation: 540 ft   POST:  Rate/Rhythem: 88SR  BP:  Supine:   Sitting: 112/67  Standing:    SaO2: 98%RA 0857-1000 Pt walked 540 ft with slow,steady gait. Pt tolerated well. To chair after walk. Education completed. Permission given to refer to [facility] Phase 2.  Reenforced no smoking.    Duanne Limerick

## 2011-05-08 NOTE — Progress Notes (Signed)
DC'd pacing wires and CT sutures per MD order and per hospital policy. Pacing wires intact. Patient tolerated well, will continue to monitor closely. Applied Benzoin and steri strips to CT suture site. Patient advised to remain in bed for 1 hour. Bernita Raisin, RN

## 2011-05-08 NOTE — Progress Notes (Signed)
UR Completed.  Samuel Hawkins Jane [phone] 05/08/2011  

## 2011-05-08 NOTE — Progress Notes (Signed)
4 Days Post-Op  Procedure(s) (LRB): CORONARY ARTERY BYPASS GRAFTING (CABG) (N/A) Subjective: He feels well  Objective  Telemetry NSR  Temp:  [97.8 F (36.6 C)-99 F (37.2 C)] 97.8 F (36.6 C) (12/21 0500) Pulse Rate:  [71-98] 82  (12/21 0500) Resp:  [18-26] 18  (12/21 0500) BP: (92-120)/(57-92) 103/67 mmHg (12/21 0500) SpO2:  [94 %-98 %] 94 % (12/21 0500) Weight:  [180 lb 1.9 oz (81.7 kg)] 180 lb 1.9 oz (81.7 kg) (12/21 0436)   Intake/Output Summary (Last 24 hours) at 05/08/11 0818 Last data filed at 05/08/11 0554  Gross per 24 hour  Intake    720 ml  Output    250 ml  Net    470 ml       General appearance: alert, cooperative and no distress Heart: regular rate and rhythm, S1, S2 normal, no murmur, click, rub or gallop Lungs: clear to auscultation bilaterally Abdomen: soft, non-tender; bowel sounds normal; no masses,  no organomegaly Extremities: no edema Wound: incisions healing well without signs of infection  Lab Results:  Basename 05/08/11 0605 05/06/11 0330 05/05/11 1700  NA 138 138 --  K 3.7 3.7 --  CL 104 106 --  CO2 24 27 --  GLUCOSE 93 99 --  BUN 15 12 --  CREATININE 1.04 0.93 --  CALCIUM 8.3* 7.9* --  MG -- -- 2.1  PHOS -- -- --   No results found for this basename: AST:2,ALT:2,ALKPHOS:2,BILITOT:2,PROT:2,ALBUMIN:2 in the last 72 hours No results found for this basename: LIPASE:2,AMYLASE:2 in the last 72 hours  Basename 05/08/11 0605 05/06/11 0330  WBC 10.1 11.8*  NEUTROABS -- --  HGB 8.5* 9.2*  HCT 24.7* 25.8*  MCV 88.2 86.9  PLT 320 209   No results found for this basename: CKTOTAL:4,CKMB:4,TROPONINI:4 in the last 72 hours No components found with this basename: POCBNP:3 No results found for this basename: DDIMER in the last 72 hours No results found for this basename: HGBA1C in the last 72 hours No results found for this basename: CHOL,HDL,LDLCALC,TRIG,CHOLHDL in the last 72 hours No results found for this basename:  TSH,T4TOTAL,FREET3,T3FREE,THYROIDAB in the last 72 hours No results found for this basename: VITAMINB12,FOLATE,FERRITIN,TIBC,IRON,RETICCTPCT in the last 72 hours  Medications: Scheduled    . acetaminophen  1,000 mg Oral Q6H   Or  . acetaminophen (TYLENOL) oral liquid 160 mg/5 mL  975 mg Per Tube Q6H  . aspirin EC  325 mg Oral Daily   Or  . aspirin  324 mg Per Tube Daily  . bisacodyl  10 mg Oral Daily   Or  . bisacodyl  10 mg Rectal Daily  . docusate sodium  200 mg Oral Daily  . enoxaparin (LOVENOX) injection  40 mg Subcutaneous Q24H  . insulin aspart  0-24 Units Subcutaneous TID AC & HS  . moving right along book   Does not apply Once  . pantoprazole  40 mg Oral Q1200  . povidone-iodine  1 application Topical BID  . sodium chloride  3 mL Intravenous Q12H  . DISCONTD: sodium chloride  3 mL Intravenous Q12H     Radiology/Studies:  No results found.  INR: Will add last result for INR, ABG once components are confirmed Will add last 4 CBG results once components are confirmed  Assessment/Plan: S/P Procedure(s) (LRB): CORONARY ARTERY BYPASS GRAFTING (CABG) (N/A)  1. Doing well, BP/HR controlled 2. D/c pacer wires 3. H/H  -abl anemia stable with HCT 24.7 from 25.8( 2 days ago) 4. cbg's well controlled 5. D/c  epw's 6. Discharge home today  LOS: 10 days    GOLD,WAYNE E 12/21/20128:18 AM

## 2011-05-08 NOTE — Discharge Summary (Signed)
[address]            [place] [address]          [phone]      Samuel Hawkins 06-23-1955 55 y.o. [id number]  04/28/2011   Loreli Slot, MD  Chest pain [786.5] Syncope [780.2] CHEST PAIN AND SYNCOPE chest pain / HPI Comments:( on admission) The patient is a 55 year old male with a chief complaint of chest pain and syncope earlier today at around 12:30 in the afternoon. 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 The patient estimates that he had passed out for no more than 30 seconds before he came around and states that after that the chest pain gradually subsided. He is currently chest pain-free. He has a medical history significant for risk factors of smoking, hyperlipidemia which is untreated, and early coronary artery disease in his family vis--vis his brother who died of a myocardial infarction in his 20s. At present he has no chest pain and is in no acute distress. He denies lower extremity edema or recent illness. He reports waxing and waning chest pain earlier this morning before 12:30 associated with lightheadedness. The patient has not previously been evaluated for chest pain or risk stratified for coronary artery disease. He was admitted for further evaluation and treatment to include cardiology consultation with Dr. Jacinto Halim. Dr. Jacinto Halim  recommended cardiac catheterization as part of his evaluation. This was done on 04/30/2011 and due to the findings he was recommended cardiac surgical evaluation. He was seen by Dr. Charlett Lango who evaluated the patient and his studies and agreed with recommendations to proceed with coronary artery bypass grafting.  Past Medical  History   Diagnosis  Date   .  Hyperlipidemia    .  History of erectile dysfunction    .  Glucose intolerance (impaired glucose tolerance)      history of    History reviewed. No pertinent past surgical history.  Family history; Brother died of MI at age 4  Social History: reports that he has been smoking Cigarettes. He has never used smokeless tobacco. He reports that he drinks alcohol. He reports that he does not use illicit drugs.Reports only "social smoking" never a pack a day or every day smoker  Allergies:  Allergies   Allergen  Reactions   .  Atorvastatin  Other (See Comments)     REACTION: muscle cramp   .  Simvastatin  Other (See Comments)     REACTION: muscle cramp    Medications:  Prior to Admission:  No prescriptions prior to admission    Past Medical History   Diagnosis  Date   .  Hyperlipidemia    .  History of erectile dysfunction    .  Glucose intolerance (impaired glucose tolerance)      history of    04/28/2011 *RADIOLOGY REPORT* Clinical Data: Chest pain, fever, syncope. Smoker. CHEST - 2 VIEW Comparison: None. Findings: The heart size and pulmonary vascularity are normal. The lungs appear clear and expanded without focal air space disease or consolidation. No blunting of the costophrenic angles. No pneumothorax. Probable old right rib fracture. Mild degenerative changes  in the spine. IMPRESSION: No evidence of active pulmonary disease. Original Report Authenticated By: Marlon Pel, M.D.   Review of Systems at time of cardiac surgical consultation Constitutional: Negative for fever, chills, weight loss and malaise/fatigue.  HENT: Negative.  Eyes: Negative.  Respiratory: Negative.  Cardiovascular: Positive for chest pain. Negative for palpitations, orthopnea, claudication, leg swelling and PND.  Gastrointestinal: Positive for heartburn. Negative for nausea and vomiting.  Genitourinary: Negative.  Musculoskeletal: Negative.  Skin: Negative.    Neurological: Positive for loss of consciousness. Negative for dizziness, tremors, speech change, focal weakness, seizures and weakness.  Endo/Heme/Allergies: Negative.  All other systems reviewed and are negative.   Blood pressure 120/61, pulse 47, temperature 98.2 F (36.8 C), temperature source Oral, resp. rate 19, height 5\' 7"  (1.702 m), weight 79.788 kg (175 lb 14.4 oz), SpO2 100.00%.  Physical Exam at time of cardiac surgical consultation Vitals reviewed.  Constitutional: He is oriented to person, place, and time. He appears well-developed and well-nourished. No distress.  HENT:  Head: Normocephalic and atraumatic.  Eyes: EOM are normal. Pupils are equal, round, and reactive to light.  Neck: Neck supple. No JVD present. No tracheal deviation present. No thyromegaly present.  Cardiovascular: Normal rate, regular rhythm and normal heart sounds. Exam reveals no gallop and no friction rub.  No murmur heard.  Respiratory: Effort normal and breath sounds normal. He has no wheezes. He has no rales.  GI: Soft. Bowel sounds are normal. There is no tenderness.  Musculoskeletal: Normal range of motion.  Lymphadenopathy:  He has no cervical adenopathy.  Neurological: He is alert and oriented to person, place, and time.  Skin: Skin is warm and dry.  Psychiatric: He has a normal mood and affect.   The patient was deemed medically acceptable for proceeding and on 05/04/2011 he was taken to the operating room where he underwent the following procedure:   OPERATIVE REPORT  PREOPERATIVE DIAGNOSIS: Severe three-vessel coronary disease with  unstable angina.  POSTOPERATIVE DIAGNOSIS: Severe three-vessel coronary disease with  unstable angina.  PROCEDURE: Median sternotomy, extracorporeal circulation, coronary  bypass grafting x6 (left internal mammary artery to LAD, right internal  mammary artery to distal right coronary, sequential saphenous vein graft  to ramus intermedius and obtuse  marginal 1, sequential saphenous vein  graft to obtuse marginals 3 and 4), combination of open and endoscopic  vein harvest, left thigh.  SURGEON: Salvatore Decent. Dorris Fetch, MD  ASSISTANT: Rowe Clack, PA-C  ANESTHESIA: General.  FINDINGS: Internal mammary arteries good quality. Saphenous vein fair  quality, multiple relatively superficial small branches required a  combination of open and endoscopic harvest. LAD and distal right good  quality targets. Lateral wall target vessels relatively small, but free of  significant disease at the site of anastomosis.  The patient tolerated the procedure well was taken to the surgical intensive care unit in stable condition.  Postoperative hospital course:  The patient has overall done quite well. He did have some mild difficulties with low relative blood pressures that made it slightly difficult to wean the pressors. This was done however over time and he is stable in this regard. He has not been started on a beta blocker due to this. He was weaned from the ventilator without difficulty. All routine lines, monitors, and drainage devices have been discontinued in the standard fashion. He has had no significant cardiac dysrhythmias. Incisions are healing well without evidence of infection. Oxygen has been weaned and he maintains good saturations on room air. He  does have acute blood loss anemia but is values have stabilized. He did have some mild volume overload but is clinically improved any evidences no significant edema. He is ambulating well using standard protocols. Overall he is felt to be stable for discharge on today's date. He sugars are under good control and he does not require any further adjuvant treatment at this time. He may require further diabetic assessment as an outpatient by his primary physician.      Basename 05/08/11 0605 05/06/11 0330  NA 138 138  K 3.7 3.7  CL 104 106  CO2 24 27  GLUCOSE 93 99  BUN 15 12  CALCIUM 8.3* 7.9*     Basename 05/08/11 0605 05/06/11 0330  WBC 10.1 11.8*  HGB 8.5* 9.2*  HCT 24.7* 25.8*  PLT 320 209   No results found for this basename: INR:2 in the last 72 hours   Discharge Instructions:  The patient is discharged to home with extensive instructions on wound care and progressive ambulation.  They are instructed not to drive or perform any heavy lifting until returning to see the physician in his office.  Discharge Diagnosis:  Chest pain [786.5] Syncope [780.2] CHEST PAIN AND SYNCOPE chest pain / Expected acute blood loss anemia  Secondary Diagnosis: Patient Active Problem List  Diagnoses  . GLUCOSE INTOLERANCE  . HYPERLIPIDEMIA  . ERECTILE DYSFUNCTION  . MICROSCOPIC HEMATURIA  . Syncope and collapse   Past Medical History  Diagnosis Date  . Hyperlipidemia   . History of erectile dysfunction   . Glucose intolerance (impaired glucose tolerance)     history of       Samuel Hawkins, Samuel Hawkins  Home Medication Instructions [id number]   Printed on:05/08/11 0839  Medication Information                    aspirin EC 325 MG EC tablet Take 1 tablet (325 mg total) by mouth daily.           oxyCODONE (OXY IR/ROXICODONE) 5 MG immediate release tablet Take 1-2 tablets (5-10 mg total) by mouth every 4 (four) hours as needed.             Disposition: Home  Patient's condition is Good  Gershon Crane, PA-C 05/08/2011  8:39 AM

## 2011-05-08 NOTE — Discharge Summary (Signed)
Agree with above Beta blocker d/c'ed secondary to bradycardia Not a candidate for ACE-I at his time due to relative hypotension. Can be reevaluated as an outpt. No statin due to previous severe myalgias.

## 2011-05-11 ENCOUNTER — Telehealth: Payer: Self-pay | Admitting: Thoracic Surgery (Cardiothoracic Vascular Surgery)

## 2011-05-11 NOTE — Telephone Encounter (Signed)
Patient called because of a few droplets of "pink" drainage from median sternotomy incision.  The amount of drainage characterized as very small and not associated with any increased chest pain, sternal instability, fever, malaise.  Patient instructed to keep the incision clean and dry and cover with sterile gauze, and telephone if drainage increases or becomes associated with fever, chest pain, malaise.  All questions answered.  Samuel Hawkins H 05/11/2011 11:35 AM

## 2011-05-27 ENCOUNTER — Other Ambulatory Visit: Payer: Self-pay | Admitting: Thoracic Surgery (Cardiothoracic Vascular Surgery)

## 2011-05-27 DIAGNOSIS — I2510 Atherosclerotic heart disease of native coronary artery without angina pectoris: Secondary | ICD-10-CM

## 2011-06-01 ENCOUNTER — Ambulatory Visit (INDEPENDENT_AMBULATORY_CARE_PROVIDER_SITE_OTHER): Payer: Self-pay | Admitting: Thoracic Surgery (Cardiothoracic Vascular Surgery)

## 2011-06-01 ENCOUNTER — Other Ambulatory Visit: Payer: Self-pay | Admitting: Thoracic Surgery (Cardiothoracic Vascular Surgery)

## 2011-06-01 ENCOUNTER — Encounter: Payer: Self-pay | Admitting: Thoracic Surgery (Cardiothoracic Vascular Surgery)

## 2011-06-01 ENCOUNTER — Ambulatory Visit
Admission: RE | Admit: 2011-06-01 | Discharge: 2011-06-01 | Disposition: A | Discharge status: Home / Self Care | Payer: Managed Care, Other (non HMO) | Source: Ambulatory Visit | Attending: Thoracic Surgery (Cardiothoracic Vascular Surgery) | Admitting: Thoracic Surgery (Cardiothoracic Vascular Surgery)

## 2011-06-01 VITALS — BP 91/60 | HR 65 | Temp 98.7°F | Resp 20 | Ht 67.0 in | Wt 176.0 lb

## 2011-06-01 DIAGNOSIS — I2510 Atherosclerotic heart disease of native coronary artery without angina pectoris: Secondary | ICD-10-CM

## 2011-06-01 DIAGNOSIS — Z951 Presence of aortocoronary bypass graft: Secondary | ICD-10-CM

## 2011-06-01 HISTORY — DX: Atherosclerotic heart disease of native coronary artery without angina pectoris: I25.10

## 2011-06-01 MED ORDER — ASPIRIN EC 325 MG PO TBEC: 325.0000 mg | DELAYED_RELEASE_TABLET | Freq: Every day | ORAL | Status: AC

## 2011-06-01 NOTE — Progress Notes (Signed)
HPI:  Samuel Hawkins returns today for a scheduled postoperative visit. He had coronary bypass grafting x6 with bilateral mammaries on 04/1711. He had an uncomplicated postoperative course. And overall had done quite well until yesterday, when he developed a dry cough, general malaise and nausea. He says today he's been feeling lightheaded. He has not really had anything to eat or a day and a half now has only been drinking Sprite and orange sodas. He said he felt lightheaded when he was down in x-ray. He also complains of some burning at the tip of his penis with urination.  He is having minimal chest discomfort, he only took pain medication for a few days after discharge, and still has most of his prescription left. He's not had any anginal-type discomfort or shortness of breath. Apparently there was some confusion and he has not been taking aspirin since discharge.  Current Outpatient Prescriptions  Medication Sig Dispense Refill  . guaiFENesin (ROBITUSSIN) 100 MG/5ML liquid Take 200 mg by mouth 3 (three) times daily as needed.      Marland Kitchen aspirin EC 325 MG tablet Take 1 tablet (325 mg total) by mouth daily.  30 tablet  0  . LEVITRA 20 MG tablet Take 10 mg by mouth daily as needed.       Marland Kitchen oxyCODONE (OXY IR/ROXICODONE) 5 MG immediate release tablet Take 5 mg by mouth every 6 (six) hours as needed.         Physical Exam BP 91/60  Pulse 65  Temp(Src) 98.7 F (37.1 C) (Oral)  Resp 20  Ht 5\' 7"  (1.702 m)  Wt 176 lb (79.833 kg)  BMI 27.57 kg/m2  SpO2 98% Gen. well-developed well-nourished, obviously uncomfortable 56 year old male Neuro intact Lungs clear with equal breath sounds, no rales, rhonchi or wheezing Cardiac regular rate and rhythm normal S1 and 2, no rubs or murmurs Sternum stable, incision healing well Leg incisions healing well, no peripheral edema  Diagnostic Tests: Chest x-ray shows good aeration bilaterally with no effusions or infiltrates  Impression: 56 year old gentleman now  about 4 weeks out from coronary bypass grafting x6. From a postoperative standpoint he's doing well. He's had no angina, his exercise tolerance is been good up until today, and is having minimal discomfort. I think he's on a good course for recovery.  Unfortunately today he feels poorly. It sounds like he has some type of virus, possibly the flu. I encouraged him to take plenty of fluids. He may use an over-the-counter cough and cold medication. I do not see an indication for antibiotics presently. He should avoid strenuous activity until he is feeling better.  He works as a Naval architect. I recommended that he wait an additional 2 months before returning to work in mid March.  He may begin driving a car on a limited basis, appropriate precautions were discussed, but should wait until after this acute illness resolves. He is not to lift any abscess over 10 pounds for at least another 2 weeks. He questions about using Levitra for erectile dysfunction, and I recommended that he wait at least another 2 weeks on that as well.  Plan: Enteric-coated aspirin prescribed Activities as noted above We'll have him set up a followup appointment with Dr. Jacinto Halim as he is not yet seen him since discharge the hospital He has allergies to statins so I will defer medical treatment of his hypercholesterolemia Dr. Jacinto Halim. His blood pressure is still too low to consider beta blocker or ACE inhibitor. I will be happy  to see him back again at any time in the future that I can be of any further assistance with his care

## 2011-06-02 LAB — URINALYSIS, ROUTINE W REFLEX MICROSCOPIC
Leukocytes, UA: NEGATIVE
Nitrite: NEGATIVE
Specific Gravity, Urine: 1.027 (ref 1.005–1.030)
Urobilinogen, UA: 0.2 mg/dL (ref 0.0–1.0)
pH: 6 (ref 5.0–8.0)

## 2011-06-02 LAB — URINALYSIS, MICROSCOPIC ONLY
Casts: NONE SEEN
Crystals: NONE SEEN

## 2011-06-03 LAB — URINE CULTURE: Colony Count: NO GROWTH

## 2011-07-13 ENCOUNTER — Encounter: Payer: Self-pay | Admitting: Internal Medicine

## 2011-07-13 DIAGNOSIS — Z0289 Encounter for other administrative examinations: Secondary | ICD-10-CM | POA: Insufficient documentation

## 2011-07-13 DIAGNOSIS — R7302 Impaired glucose tolerance (oral): Secondary | ICD-10-CM

## 2011-07-13 DIAGNOSIS — Z951 Presence of aortocoronary bypass graft: Secondary | ICD-10-CM

## 2011-07-13 HISTORY — DX: Impaired glucose tolerance (oral): R73.02

## 2011-07-13 HISTORY — DX: Presence of aortocoronary bypass graft: Z95.1

## 2011-07-14 ENCOUNTER — Telehealth: Payer: Self-pay

## 2011-07-14 NOTE — Telephone Encounter (Signed)
[facility] sent fax to inform of medication update. Will add medications to patients med. List.

## 2011-08-27 ENCOUNTER — Encounter: Payer: Managed Care, Other (non HMO) | Admitting: Internal Medicine

## 2011-08-28 ENCOUNTER — Ambulatory Visit (INDEPENDENT_AMBULATORY_CARE_PROVIDER_SITE_OTHER): Payer: Managed Care, Other (non HMO) | Admitting: Internal Medicine

## 2011-08-28 ENCOUNTER — Ambulatory Visit: Payer: Managed Care, Other (non HMO) | Admitting: Internal Medicine

## 2011-08-28 VITALS — BP 120/70 | HR 62 | Temp 97.5°F | Ht 68.0 in | Wt 184.0 lb

## 2011-08-28 DIAGNOSIS — E785 Hyperlipidemia, unspecified: Secondary | ICD-10-CM

## 2011-08-28 DIAGNOSIS — Z0000 Encounter for general adult medical examination without abnormal findings: Secondary | ICD-10-CM

## 2011-08-28 DIAGNOSIS — R7309 Other abnormal glucose: Secondary | ICD-10-CM

## 2011-08-28 DIAGNOSIS — R7302 Impaired glucose tolerance (oral): Secondary | ICD-10-CM

## 2011-08-28 NOTE — Patient Instructions (Signed)
Continue all other medications as before Please have the pharmacy call with any refills you may need. Please go to LAB in the Basement for the blood and/or urine tests to be done in 4 weeks You will be contacted by phone if any changes need to be made immediately.  Otherwise, you will receive a letter about your results with an explanation. Please keep your appointments with your specialists as you have planned - Dr Jacinto Halim in August 2013 Please return in 6 months, or sooner if needed

## 2011-08-29 ENCOUNTER — Encounter: Payer: Self-pay | Admitting: Internal Medicine

## 2011-08-29 NOTE — Assessment & Plan Note (Signed)

## 2011-08-29 NOTE — Assessment & Plan Note (Signed)
Asympt,  Lab Results  Component Value Date   HGBA1C 5.6 05/01/2011  '

## 2011-08-29 NOTE — Assessment & Plan Note (Signed)
stable overall by hx and exam, most recent data reviewed with pt - pt brings outside lab with feb 2013 LDL 65, and pt to continue medical treatment as before

## 2011-08-29 NOTE — Progress Notes (Signed)
Subjective:    Patient ID: Samuel Hawkins, male    DOB: 1955/12/26, 56 y.o.   MRN: [id number]  HPI  Here for wellness and f/u;  Overall doing ok;  Pt denies CP, worsening SOB, DOE, wheezing, orthopnea, PND, worsening LE edema, palpitations, dizziness or syncope.  Pt denies neurological change such as new Headache, facial or extremity weakness.  Pt denies polydipsia, polyuria, or low sugar symptoms. Pt states overall good compliance with treatment and medications, good tolerability, and trying to follow lower cholesterol diet.  Pt denies worsening depressive symptoms, suicidal ideation or panic. No fever, wt loss, night sweats, loss of appetite, or other constitutional symptoms.  Pt states good ability with ADL's, low fall risk, home safety reviewed and adequate, no significant changes in hearing or vision, and occasionally active with exercise.  Very concerned about staying on top of his CRF's to avoid further heart disease.  Has appt with Dr Nance Pew in aug 2013.  Of note declines colonscopy at this time Past Medical History  Diagnosis Date  . Hyperlipidemia   . History of erectile dysfunction   . Glucose intolerance (impaired glucose tolerance)     history of  . Impaired glucose tolerance 07/13/2011  . CAD (coronary artery disease) 06/01/2011  . HYPERLIPIDEMIA 05/01/2008  . ERECTILE DYSFUNCTION 05/01/2008  . 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 Coronary artery bypass graft 05/04/2011    Procedure: CORONARY ARTERY BYPASS GRAFTING (CABG);  Surgeon: Loreli Slot, MD;  Location: [facility] OR;  Service: Open Heart Surgery;  Laterality: N/A;  CABG x  six;  using bilateral  internal mammary arteries and left leg greater saphenous vein harvested endoscopically    reports that he has been smoking Cigarettes.  He has never used smokeless tobacco. He reports that he drinks alcohol. He reports that he does not use illicit drugs. family history is  not on file. Allergies  Allergen Reactions  . Atorvastatin Other (See Comments)    REACTION: muscle cramp  . Simvastatin Other (See Comments)    REACTION: muscle cramp   Current Outpatient Prescriptions on File Prior to Visit  Medication Sig Dispense Refill  . aspirin 81 MG tablet Take 81 mg by mouth daily.      . carvedilol (COREG) 3.125 MG tablet Take 3.125 mg by mouth 2 (two) times daily.      . clopidogrel (PLAVIX) 75 MG tablet Take 75 mg by mouth daily.      Marland Kitchen ezetimibe-simvastatin (VYTORIN) 10-20 MG per tablet Take 1 tablet by mouth at bedtime.      Marland Kitchen guaiFENesin (ROBITUSSIN) 100 MG/5ML liquid Take 200 mg by mouth 3 (three) times daily as needed.      Marland Kitchen LEVITRA 20 MG tablet Take 10 mg by mouth daily as needed.       Marland Kitchen oxyCODONE (OXY IR/ROXICODONE) 5 MG immediate release tablet Take 5 mg by mouth every 6 (six) hours as needed.        Review of Systems Review of Systems  Constitutional: Negative for diaphoresis, activity change, appetite change and unexpected weight change.  HENT: Negative for hearing loss, ear pain, facial swelling, mouth sores and neck stiffness.   Eyes: Negative for pain, redness and visual disturbance.  Respiratory: Negative for shortness of breath and wheezing.   Cardiovascular: Negative for chest pain and palpitations.  Gastrointestinal: Negative for diarrhea, blood in stool, abdominal distention and rectal pain.  Genitourinary: Negative for hematuria, flank pain and  decreased urine volume.  Musculoskeletal: Negative for myalgias and joint swelling.  Skin: Negative for color change and wound.  Neurological: Negative for syncope and numbness.  Hematological: Negative for adenopathy.  Psychiatric/Behavioral: Negative for hallucinations, self-injury, decreased concentration and agitation.      Objective:   Physical Exam BP 120/70  Pulse 62  Temp(Src) 97.5 F (36.4 C) (Oral)  Ht 5\' 8"  (1.727 m)  Wt 184 lb (83.462 kg)  BMI 27.98 kg/m2  SpO2  95% Physical Exam  VS noted Constitutional: Pt is oriented to person, place, and time. Appears well-developed and well-nourished.  HENT:  Head: Normocephalic and atraumatic.  Right Ear: External ear normal.  Left Ear: External ear normal.  Nose: Nose normal.  Mouth/Throat: Oropharynx is clear and moist.  Eyes: Conjunctivae and EOM are normal. Pupils are equal, round, and reactive to light.  Neck: Normal range of motion. Neck supple. No JVD present. No tracheal deviation present.  Cardiovascular: Normal rate, regular rhythm, normal heart sounds and intact distal pulses.   Pulmonary/Chest: Effort normal and breath sounds normal.  Abdominal: Soft. Bowel sounds are normal. There is no tenderness.  Musculoskeletal: Normal range of motion. Exhibits no edema.  Lymphadenopathy:  Has no cervical adenopathy.  Neurological: Pt is alert and oriented to person, place, and time. Pt has normal reflexes. No cranial nerve deficit.  Skin: Skin is warm and dry. No rash noted.  Psychiatric:  Has  normal mood and affect. Behavior is normal.     Assessment & Plan:

## 2011-09-17 ENCOUNTER — Encounter: Payer: Self-pay | Admitting: Internal Medicine

## 2011-09-17 ENCOUNTER — Other Ambulatory Visit (INDEPENDENT_AMBULATORY_CARE_PROVIDER_SITE_OTHER): Payer: Managed Care, Other (non HMO)

## 2011-09-17 DIAGNOSIS — Z0000 Encounter for general adult medical examination without abnormal findings: Secondary | ICD-10-CM

## 2011-09-17 DIAGNOSIS — R7309 Other abnormal glucose: Secondary | ICD-10-CM

## 2011-09-17 DIAGNOSIS — R7302 Impaired glucose tolerance (oral): Secondary | ICD-10-CM

## 2011-09-17 LAB — LIPID PANEL
Cholesterol: 126 mg/dL (ref 0–200)
LDL Cholesterol: 77 mg/dL (ref 0–99)
Triglycerides: 105 mg/dL (ref 0.0–149.0)

## 2011-09-17 LAB — URINALYSIS, ROUTINE W REFLEX MICROSCOPIC
Bilirubin Urine: NEGATIVE
Ketones, ur: NEGATIVE
Leukocytes, UA: NEGATIVE
pH: 6 (ref 5.0–8.0)

## 2011-09-17 LAB — CBC WITH DIFFERENTIAL/PLATELET
Basophils Relative: 0.3 % (ref 0.0–3.0)
Eosinophils Relative: 2.1 % (ref 0.0–5.0)
Lymphocytes Relative: 33.7 % (ref 12.0–46.0)
MCV: 87.7 fl (ref 78.0–100.0)
Monocytes Absolute: 0.6 10*3/uL (ref 0.1–1.0)
Monocytes Relative: 6.8 % (ref 3.0–12.0)
Neutrophils Relative %: 57.1 % (ref 43.0–77.0)
Platelets: 298 10*3/uL (ref 150.0–400.0)
RBC: 4.89 Mil/uL (ref 4.22–5.81)
WBC: 9.1 10*3/uL (ref 4.5–10.5)

## 2011-09-17 LAB — PSA: PSA: 0.55 ng/mL (ref 0.10–4.00)

## 2011-09-17 LAB — BASIC METABOLIC PANEL
BUN: 14 mg/dL (ref 6–23)
Chloride: 105 mEq/L (ref 96–112)
Creatinine, Ser: 1 mg/dL (ref 0.4–1.5)
Glucose, Bld: 88 mg/dL (ref 70–99)
Potassium: 4.6 mEq/L (ref 3.5–5.1)

## 2011-09-17 LAB — HEPATIC FUNCTION PANEL
ALT: 19 U/L (ref 0–53)
AST: 23 U/L (ref 0–37)
Albumin: 4.1 g/dL (ref 3.5–5.2)
Total Bilirubin: 1 mg/dL (ref 0.3–1.2)
Total Protein: 7.2 g/dL (ref 6.0–8.3)

## 2011-09-17 LAB — HEMOGLOBIN A1C: Hgb A1c MFr Bld: 5.8 % (ref 4.6–6.5)

## 2011-09-17 LAB — TSH: TSH: 2.52 u[IU]/mL (ref 0.35–5.50)

## 2011-11-10 ENCOUNTER — Other Ambulatory Visit: Payer: Self-pay | Admitting: Internal Medicine

## 2011-11-14 ENCOUNTER — Other Ambulatory Visit: Payer: Self-pay | Admitting: Internal Medicine

## 2012-11-15 HISTORY — PX: NM MYOVIEW LTD: HXRAD82

## 2013-04-07 ENCOUNTER — Ambulatory Visit: Payer: Managed Care, Other (non HMO) | Admitting: Internal Medicine

## 2014-04-25 ENCOUNTER — Encounter (HOSPITAL_COMMUNITY): Payer: Self-pay | Admitting: Cardiology

## 2016-01-15 ENCOUNTER — Encounter: Payer: Self-pay | Admitting: Internal Medicine

## 2016-01-15 ENCOUNTER — Ambulatory Visit (INDEPENDENT_AMBULATORY_CARE_PROVIDER_SITE_OTHER): Payer: Managed Care, Other (non HMO) | Admitting: Internal Medicine

## 2016-01-15 VITALS — BP 120/82 | HR 59 | Temp 97.8°F | Resp 20 | Wt 198.0 lb

## 2016-01-15 DIAGNOSIS — Z1159 Encounter for screening for other viral diseases: Secondary | ICD-10-CM

## 2016-01-15 DIAGNOSIS — E785 Hyperlipidemia, unspecified: Secondary | ICD-10-CM

## 2016-01-15 DIAGNOSIS — R7302 Impaired glucose tolerance (oral): Secondary | ICD-10-CM

## 2016-01-15 DIAGNOSIS — I25810 Atherosclerosis of coronary artery bypass graft(s) without angina pectoris: Secondary | ICD-10-CM

## 2016-01-15 DIAGNOSIS — Z0001 Encounter for general adult medical examination with abnormal findings: Secondary | ICD-10-CM

## 2016-01-15 DIAGNOSIS — R6889 Other general symptoms and signs: Secondary | ICD-10-CM

## 2016-01-15 NOTE — Progress Notes (Signed)
Subjective:    Patient ID: Samuel Hawkins, male    DOB: 08/01/1955, 60 y.o.   MRN: [id number]  HPI  Here to establish as new pt; overall doing ok,  Pt denies chest pain, increasing sob or doe, wheezing, orthopnea, PND, increased LE swelling, palpitations, dizziness or syncope.  Pt denies new neurological symptoms such as new headache, or facial or extremity weakness or numbness.  Pt denies polydipsia, polyuria, or low sugar episode.   Pt denies new neurological symptoms such as new headache, or facial or extremity weakness or numbness.   Pt states overall good compliance with meds, mostly trying to follow appropriate diet, with wt overall stable,  but little exercise however.  Pt specificially requests change of cardiologist, no longer feels comfortable with manner of previous.  Had PSA prior this yr with cpx with prior MD. Declines flu shot.  Needs jury duty letter. Past Medical History:  Diagnosis Date  . CAD (coronary artery disease) 06/01/2011  . ERECTILE DYSFUNCTION 05/01/2008  . Glucose intolerance (impaired glucose tolerance)    history of  . History of erectile dysfunction   . Hyperlipidemia   . HYPERLIPIDEMIA 05/01/2008  . Impaired glucose tolerance 07/13/2011  . S/P CABG (coronary artery bypass graft) 07/13/2011   Past Surgical History:  Procedure Laterality Date  . CORONARY ARTERY BYPASS GRAFT  05/04/2011   Procedure: CORONARY ARTERY BYPASS GRAFTING (CABG);  Surgeon: Loreli SlotSteven C Hendrickson, MD;  [facility];  Service: Open Heart Surgery;  Laterality: N/A;  CABG x  six;  using bilateral  internal mammary arteries and left leg greater saphenous vein harvested endoscopically  . LEFT HEART CATHETERIZATION WITH CORONARY ANGIOGRAM N/A 04/30/2011   Procedure: LEFT HEART CATHETERIZATION WITH CORONARY ANGIOGRAM;  Surgeon: Pamella PertJagadeesh R Ganji, MD;  [facility];  Service: Cardiovascular;  Laterality: N/A;    reports that he has been smoking Cigarettes.  He has never used smokeless  tobacco. He reports that he drinks alcohol. He reports that he does not use drugs. family history is not on file. Allergies  Allergen Reactions  . Atorvastatin Other (See Comments)    REACTION: muscle cramp  . Simvastatin Other (See Comments)    REACTION: muscle cramp   Current Outpatient Prescriptions on File Prior to Visit  Medication Sig Dispense Refill  . aspirin 81 MG tablet Take 81 mg by mouth daily.    . carvedilol (COREG) 3.125 MG tablet Take 3.125 mg by mouth 2 (two) times daily.     No current facility-administered medications on file prior to visit.     Review of Systems  Constitutional: Negative for unusual diaphoresis or night sweats HENT: Negative for ear swelling or discharge Eyes: Negative for worsening visual haziness  Respiratory: Negative for choking and stridor.   Gastrointestinal: Negative for distension or worsening eructation Genitourinary: Negative for retention or change in urine volume.  Musculoskeletal: Negative for other MSK pain or swelling Skin: Negative for color change and worsening wound Neurological: Negative for tremors and numbness other than noted  Psychiatric/Behavioral: Negative for decreased concentration or agitation other than above       Objective:   Physical Exam BP 120/82   Pulse (!) 59   Temp 97.8 F (36.6 C) (Oral)   Resp 20   Wt 198 lb (89.8 kg)   SpO2 96%   BMI 30.11 kg/m  VS noted,  Constitutional: Pt appears in no apparent distress HENT: Head: NCAT.  Right Ear: External ear normal.  Left Ear: External ear normal.  Eyes: . Pupils are equal, round, and reactive to light. Conjunctivae and EOM are normal Neck: Normal range of motion. Neck supple.  Cardiovascular: Normal rate and regular rhythm.   Pulmonary/Chest: Effort normal and breath sounds without rales or wheezing.  Abd:  Soft, NT, ND, + BS Neurological: Pt is alert. Not confused , motor grossly intact Skin: Skin is warm. No rash, no LE edema Psychiatric: Pt  behavior is normal. No agitation.     Assessment & Plan:

## 2016-01-15 NOTE — Progress Notes (Signed)
Pre visit review using our clinic review tool, if applicable. No additional management support is needed unless otherwise documented below in the visit note. 

## 2016-01-15 NOTE — Patient Instructions (Signed)
You will be contacted regarding the referral for: Cardiology  Please continue all other medications as before, and refills have been done if requested.  Please have the pharmacy call with any other refills you may need.  Please continue your efforts at being more active, low cholesterol diet, and weight control.  Please keep your appointments with your other specialists as you may have planned  Please return in 6 months, or sooner if needed, with Lab testing done 3-5 days before

## 2016-01-19 NOTE — Assessment & Plan Note (Signed)
stable overall by history and exam, recent data reviewed with pt, and pt to continue medical treatment as before,  to f/u any worsening symptoms or concerns Lab Results  Component Value Date   HGBA1C 5.8 09/17/2011

## 2016-01-19 NOTE — Assessment & Plan Note (Signed)
Stable, pt requests change of cardiology, will defer to pt request, cont same tx

## 2016-01-19 NOTE — Assessment & Plan Note (Signed)
Lab Results  Component Value Date   LDLCALC 77 09/17/2011   stable overall by history and exam, recent data reviewed with pt, and pt to continue medical treatment as before,  to f/u any worsening symptoms or concerns

## 2016-03-09 ENCOUNTER — Ambulatory Visit (INDEPENDENT_AMBULATORY_CARE_PROVIDER_SITE_OTHER): Payer: Managed Care, Other (non HMO) | Admitting: Cardiology

## 2016-03-09 ENCOUNTER — Encounter: Payer: Self-pay | Admitting: Cardiology

## 2016-03-09 VITALS — BP 129/80 | HR 65 | Ht 67.0 in | Wt 203.0 lb

## 2016-03-09 DIAGNOSIS — Z951 Presence of aortocoronary bypass graft: Secondary | ICD-10-CM

## 2016-03-09 DIAGNOSIS — I25119 Atherosclerotic heart disease of native coronary artery with unspecified angina pectoris: Secondary | ICD-10-CM

## 2016-03-09 DIAGNOSIS — R7302 Impaired glucose tolerance (oral): Secondary | ICD-10-CM

## 2016-03-09 DIAGNOSIS — E785 Hyperlipidemia, unspecified: Secondary | ICD-10-CM | POA: Diagnosis not present

## 2016-03-09 DIAGNOSIS — I10 Essential (primary) hypertension: Secondary | ICD-10-CM

## 2016-03-09 NOTE — Assessment & Plan Note (Signed)
Stable no active angina symptoms. No syncope or near-syncope. I report he had a stress test done last year. We will get this from Dr. Verl DickerGanji's office. He would've been due last year. I would suspect he may have evidence of inferior infarct.  He is on stable regimen of aspirin, statin, beta blocker and ARB.

## 2016-03-09 NOTE — Assessment & Plan Note (Signed)
Well-controlled on current dose of carvedilol and losartan

## 2016-03-09 NOTE — Progress Notes (Signed)
PCP: Oliver Barre, MD  Prior Cardiologist: Dr. Jacinto Halim.  Clinic Note: Chief Complaint  Patient presents with  . Establish Care    New Cardiologist  . Coronary Artery Disease    cabg    HPI: Samuel Hawkins is a 60 y.o. male with a PMH notable for multivessel CAD status post CABG 4 who presents today for reestablishing cardiology care. Cardiac risk factors of the time included smoking, hyperlipidemia and significant family history of brother with MI in his 27s  Samuel Hawkins was last seen on 01/15/2016 by his PCP (Dr. Jonny Ruiz) to establish primary care. At that time he denied any chest pain, worsening dyspnea or dyspnea on exertion. No orthopnea, PND or edema. No palpitations or syncope. He indicated he is compliant with his medications. Social History  Substance Use Topics  . Smoking status: Former Smoker    Types: Cigarettes    Quit date: 05/10/2011  . Smokeless tobacco: Never Used     Comment: social smoker only prior to MI  . Alcohol use Yes     Comment: occa   Recent Hospitalizations: None  Studies Reviewed:   Echo: EF 55-60%. Mild posterior-inferior hypokinesis. GR 1 DD.  CATH 04/2011: (Presented with Syncope, EKG with Prior Inf MI & Echo with Inferior HK -- UNSTABLE ANGINA)  EF 55%; dominant right with diffuse 80% mid vessel disease. Just prior to bifurcation is a 90% stenosis.  D1 90% & LAD 70% after D1 - FFR positive. Large D2 noted.  Proximal circumflex 90% at OM1, ostial OM1 80%. Subtotal OM 2 with TIMI 1 flow (large vessel with several branches).  Ostial ramus 80%.  CABGx4 (Dr. Dorris Fetch) Dec 2012: LIMA to LAD, RIMA-dRCA, SeqSVG-OM3-OM4  Stress Test ~1 yr ago - Dr. Jacinto Halim   Interval History: Samuel Hawkins presents today to establish new cardiology care. He was formerly followed by Dr. Jacinto Halim, but would like to switch cardiologists, in order to stay in the [facility] system. He is doing fairly well since his bypass surgery with declining any symptoms of resting or exertional  chest tightness/pressure. He really had no anginal symptoms at the initial presentation of his MI back in 2012. He simply had an episode of syncope. He denies any recurrence of any syncope or near syncopal type symptoms. No palpitations or rapid/irregular heartbeats. No heart failure symptoms of PND, orthopnea or edema.  Cardiovascular Review of Symptoms: No chest pain or shortness of breath with rest or exertion. No PND, orthopnea or edema. No palpitations, lightheadedness, dizziness, weakness or syncope/near syncope. No TIA/amaurosis fugax symptoms.  No claudication.  ROS: A comprehensive was performed. Review of Systems  Constitutional: Negative for fever, malaise/fatigue and weight loss (He is keen 20 pounds in the last 4 years).  HENT: Negative for nosebleeds.   Respiratory: Negative for cough and shortness of breath.   Gastrointestinal: Positive for heartburn (Controlled with omeprazole). Negative for blood in stool and constipation.  Genitourinary: Negative for hematuria.  Musculoskeletal: Negative for joint pain and myalgias.  Neurological: Negative.  Negative for headaches.  Endo/Heme/Allergies: Does not bruise/bleed easily.  Psychiatric/Behavioral: Negative for depression, memory loss and substance abuse. The patient does not have insomnia.   All other systems reviewed and are negative.   Past Medical History:  Diagnosis Date  . CAD (coronary artery disease) 04/2012   Dr. Jacinto Halim: EF 55%; dominant right with diffuse 80% mid vessel disease. Just prior to bifurcation is a 90% stenosis.  D1 90% & LAD 70% after D1 - FFR positive. Large  D2 noted.  Proximal circumflex 90% at OM1, ostial OM1 80%. Subtotal OM 2 with TIMI 1 flow (large vessel with several branches).  Ostial ramus 80%.  . ERECTILE DYSFUNCTION 05/01/2008  . Glucose intolerance (impaired glucose tolerance)    history of  . History of erectile dysfunction   . Hyperlipidemia   . HYPERLIPIDEMIA 05/01/2008  . Impaired glucose  tolerance 07/13/2011  . 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    Past Surgical History:  Procedure Laterality Date  . CORONARY ARTERY BYPASS GRAFT  05/04/2011   Procedure: CORONARY ARTERY BYPASS GRAFTING (CABG);  Surgeon: Loreli Slot, MD;  Location: [facility] OR;  Service: Open Heart Surgery;  Laterality: N/A;  CABG x  six;  using bilateral  internal mammary arteries and left leg greater saphenous vein harvested endoscopically --LIMA-LAD, RIMA-dRCA, SeqSVG-OM3-OM4  . LEFT HEART CATHETERIZATION WITH CORONARY ANGIOGRAM N/A 04/30/2011   Procedure: LEFT HEART CATHETERIZATION WITH CORONARY ANGIOGRAM;  Surgeon: Pamella Pert, MD;  Location: [facility] CATH LAB: EF 55%; dominant right with diffuse 80% mid vessel disease. Just prior to bifurcation is a 90% stenosis.  D1 90% & LAD 70% after D1 - FFR positive. Large D2 noted.  Proximal circumflex 90% at OM1, ost OM1 80%. Subtotal OM 2 w/ TIMI 1 flow (large w/ several branches), Ost RI 80%    Prior to Admission medications   Medication Sig Start Date Taking? Authorizing Provider  aspirin 81 MG tablet Take 81 mg by mouth daily.  Yes Historical Provider, MD  carvedilol (COREG) 3.125 MG tablet Take 3.125 mg by mouth 2 (two) times daily.  Yes Historical Provider, MD  losartan (COZAAR) 50 MG tablet Take 1 tablet by mouth daily. 02/04/16 Yes Historical Provider, MD  omeprazole (PRILOSEC) 40 MG capsule Take 40 mg by mouth daily.  Yes Historical Provider, MD  rosuvastatin (CRESTOR) 20 MG tablet Take 1 tablet by mouth daily. 03/02/16 Yes Historical Provider, MD    Allergies  Allergen Reactions  . Atorvastatin Other (See Comments)    REACTION: muscle cramp  . Simvastatin Other (See Comments)    REACTION: muscle cramp    Social History   Social History  . Marital status: Divorced    Spouse name: N/A  . Number of children: N/A  . Years of education: N/A   Occupational History  . [employer]     Social History Main Topics  . Smoking status: Former Smoker    Types: Cigarettes    Quit date: 05/10/2011  . Smokeless tobacco: Never Used     Comment: social smoker only prior to MI  . Alcohol use Yes     Comment: occa  . Drug use: No  . Sexual activity: No   Other Topics Concern  . None   Social History Narrative  . None    Family History  Problem Relation Age of Onset  . Heart attack Brother     Wt Readings from Last 3 Encounters:  03/09/16 92.1 kg (203 lb)  01/15/16 89.8 kg (198 lb)  08/28/11 83.5 kg (184 lb)    PHYSICAL EXAM BP 129/80   Pulse 65   Ht 5\' 7"  (1.702 m)   Wt 92.1 kg (203 lb)   BMI 31.79 kg/m  General appearance: alert, cooperative, appears stated age, no distress and borderline/mildly obese HEENT: [place]/AT, EOMI, MMM, anicteric sclera Neck: no adenopathy, no carotid bruit and no JVD Lungs: clear to auscultation bilaterally, normal percussion bilaterally and non-labored Heart: regular  rate and rhythm, S1, S2 normal, no murmur, click, rub or gallop; Non-displaced PMI Abdomen: soft, non-tender; bowel sounds normal; no masses,  no organomegaly; No HJR Extremities: extremities normal, atraumatic, no cyanosis, or edema  Pulses: 2+ and symmetric;  Skin: Mild excoriations and scars, but no acute lesions or  Neurologic: Mental status: Alert, oriented, thought content appropriate Cranial nerves: normal (II-XII grossly intact)    Adult ECG Report  Rate: 65 ;  Rhythm: normal sinus rhythm and Inferior infarct, age undetermined. Otherwise normal axis, intervals and durations.;   Narrative Interpretation: relatively stable EKG. Inferior T-wave inversions are no longer present  Other studies Reviewed: Additional studies/ records that were reviewed today include:  Recent Labs:  Last Check - Dr. Jacinto Halim 6-8 months ago Lab Results  Component Value Date   CHOL 126 09/17/2011   HDL 28.30 (L) 09/17/2011   LDLCALC 77 09/17/2011   LDLDIRECT 176.2 07/10/2010    TRIG 105.0 09/17/2011   CHOLHDL 4 09/17/2011    ASSESSMENT / PLAN: Problem List Items Addressed This Visit    S/P CABG (coronary artery bypass graft) (Chronic)    We'll need to obtain results of last stress test. He did not have classic anginal symptoms at the time of his initial presentation. Result, I would continue with screening stress test every 3-4 years.      Relevant Orders   EKG 12-Lead   Comprehensive Metabolic Panel (CMET)   Lipid panel   HgB A1c   Impaired glucose tolerance    Lasting balloon A1c was 5.8 back in 2013. I'll be checking his lipid panel. We will recheck hemoglobin A1c to ensure he has not developed diabetes.      Relevant Orders   EKG 12-Lead   Comprehensive Metabolic Panel (CMET)   Lipid panel   HgB A1c   Hyperlipidemia with target LDL less than 70 (Chronic)    Has not had recent check. We'll order fasting lipid panel, chemistry panel and hemoglobin A1c      Relevant Medications   losartan (COZAAR) 50 MG tablet   rosuvastatin (CRESTOR) 20 MG tablet   Other Relevant Orders   EKG 12-Lead   Comprehensive Metabolic Panel (CMET)   Lipid panel   HgB A1c   Essential hypertension (Chronic)    Well-controlled on current dose of carvedilol and losartan      Relevant Medications   losartan (COZAAR) 50 MG tablet   rosuvastatin (CRESTOR) 20 MG tablet   Coronary artery disease involving native heart with angina pectoris (HCC) - Primary (Chronic)    Stable no active angina symptoms. No syncope or near-syncope. I report he had a stress test done last year. We will get this from Dr. Verl Dicker office. He would've been due last year. I would suspect he may have evidence of inferior infarct.  He is on stable regimen of aspirin, statin, beta blocker and ARB.      Relevant Medications   losartan (COZAAR) 50 MG tablet   rosuvastatin (CRESTOR) 20 MG tablet   Other Relevant Orders   EKG 12-Lead   Comprehensive Metabolic Panel (CMET)   Lipid panel   HgB A1c      Other Visit Diagnoses   None.     Current medicines are reviewed at length with the patient today. (+/- concerns) n/a The following changes have been made: n/a  Patient Instructions  Fasting Lab ( cmet,lipid,a1c )   Sign medical release to obtain records from [facility]    Your physician wants you to  follow-up in: 6 months. You will receive a reminder letter in the mail two months in advance. If you don't receive a letter, please call our office to schedule the follow-up appointment.    Studies Ordered:   Orders Placed This Encounter  Procedures  . Comprehensive Metabolic Panel (CMET)  . Lipid panel  . HgB A1c  . EKG 12-Lead      Bryan Lemmaavid Ahniyah Giancola, M.D., M.S. Interventional Cardiologist   Pager # [phone] Phone # [phone] [address] [place], [place] [address]

## 2016-03-09 NOTE — Patient Instructions (Signed)
Fasting Lab ( cmet,lipid,a1c )   [facility]    Your physician wants you to follow-up in: 6 months. You will receive a reminder letter in the mail two months in advance. If you don't receive a letter, please call our office to schedule the follow-up appointment.

## 2016-03-09 NOTE — Assessment & Plan Note (Signed)
We'll need to obtain results of last stress test. He did not have classic anginal symptoms at the time of his initial presentation. Result, I would continue with screening stress test every 3-4 years.

## 2016-03-09 NOTE — Assessment & Plan Note (Signed)
Has not had recent check. We'll order fasting lipid panel, chemistry panel and hemoglobin A1c

## 2016-03-09 NOTE — Assessment & Plan Note (Signed)
Lasting balloon A1c was 5.8 back in 2013. I'll be checking his lipid panel. We will recheck hemoglobin A1c to ensure he has not developed diabetes.

## 2016-03-10 LAB — COMPREHENSIVE METABOLIC PANEL
ALK PHOS: 65 U/L (ref 40–115)
ALT: 19 U/L (ref 9–46)
AST: 24 U/L (ref 10–35)
Albumin: 3.9 g/dL (ref 3.6–5.1)
BUN: 21 mg/dL (ref 7–25)
CHLORIDE: 105 mmol/L (ref 98–110)
CO2: 23 mmol/L (ref 20–31)
CREATININE: 1.1 mg/dL (ref 0.70–1.33)
Calcium: 9 mg/dL (ref 8.6–10.3)
GLUCOSE: 103 mg/dL — AB (ref 65–99)
POTASSIUM: 4.4 mmol/L (ref 3.5–5.3)
SODIUM: 139 mmol/L (ref 135–146)
TOTAL PROTEIN: 6.6 g/dL (ref 6.1–8.1)
Total Bilirubin: 0.5 mg/dL (ref 0.2–1.2)

## 2016-03-10 LAB — LIPID PANEL
CHOL/HDL RATIO: 5.8 ratio — AB (ref <=5.0)
CHOLESTEROL: 134 mg/dL (ref 125–200)
HDL: 23 mg/dL — ABNORMAL LOW (ref >=40)
LDL Cholesterol: 83 mg/dL (ref <130)
Triglycerides: 138 mg/dL (ref <150)
VLDL: 28 mg/dL (ref <30)

## 2016-03-10 LAB — HEMOGLOBIN A1C
HEMOGLOBIN A1C: 5.8 % — AB (ref <5.7)
Mean Plasma Glucose: 120 mg/dL

## 2016-04-01 ENCOUNTER — Encounter: Payer: Self-pay | Admitting: Cardiology

## 2016-06-05 ENCOUNTER — Other Ambulatory Visit: Payer: Self-pay

## 2016-06-05 MED ORDER — ROSUVASTATIN CALCIUM 20 MG PO TABS: 20.0000 mg | ORAL_TABLET | Freq: Every day | ORAL | 5 refills | Status: DC

## 2016-07-14 ENCOUNTER — Ambulatory Visit: Payer: Managed Care, Other (non HMO) | Admitting: Internal Medicine

## 2016-08-20 ENCOUNTER — Encounter: Payer: Self-pay | Admitting: Cardiology

## 2016-08-20 ENCOUNTER — Telehealth: Payer: Self-pay | Admitting: Cardiology

## 2016-08-20 NOTE — Telephone Encounter (Signed)
New message   Pt called and said he needs a stress test for his physical. I advised him that no orders were put in for a stress test. He wants to know what he has to do to get one scheduled because he needs one. Requests a call back.

## 2016-08-20 NOTE — Telephone Encounter (Signed)
Probably not unreasonable to go ahead and do a stress test. He had abnormal EKG portion of his stress test on the last stress test. Therefore think we need to a treadmill Myoview stress test. We can do this prior to seeing me in follow-up.  Indication would be coronary artery disease & for pre-employment physical.  Bryan Lemma, MD

## 2016-08-20 NOTE — Telephone Encounter (Signed)
Returned the phone call to the patient. 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 He is due for a 6 month check up but wants to know if he can go ahead and get the stress test done as well. Please advise. Thank you

## 2016-08-21 ENCOUNTER — Telehealth (HOSPITAL_COMMUNITY): Payer: Self-pay

## 2016-08-21 ENCOUNTER — Other Ambulatory Visit: Payer: Self-pay | Admitting: *Deleted

## 2016-08-21 DIAGNOSIS — Z021 Encounter for pre-employment examination: Secondary | ICD-10-CM

## 2016-08-21 DIAGNOSIS — I25119 Atherosclerotic heart disease of native coronary artery with unspecified angina pectoris: Secondary | ICD-10-CM

## 2016-08-21 NOTE — Telephone Encounter (Signed)
Spoke with the patient. An order has been placed for a Myoview Stress test per Dr. Herbie Baltimore. I have routed it to scheduling for the stress test and a 6 month follow up.

## 2016-08-21 NOTE — Telephone Encounter (Signed)
Encounter complete. 

## 2016-08-26 ENCOUNTER — Ambulatory Visit (HOSPITAL_COMMUNITY)
Admission: RE | Admit: 2016-08-26 | Discharge: 2016-08-26 | Disposition: A | Discharge status: Home / Self Care | Payer: Managed Care, Other (non HMO) | Source: Ambulatory Visit | Attending: Cardiology | Admitting: Cardiology

## 2016-08-26 DIAGNOSIS — I252 Old myocardial infarction: Secondary | ICD-10-CM | POA: Insufficient documentation

## 2016-08-26 DIAGNOSIS — Z021 Encounter for pre-employment examination: Secondary | ICD-10-CM | POA: Insufficient documentation

## 2016-08-26 DIAGNOSIS — E119 Type 2 diabetes mellitus without complications: Secondary | ICD-10-CM | POA: Insufficient documentation

## 2016-08-26 DIAGNOSIS — Z87891 Personal history of nicotine dependence: Secondary | ICD-10-CM | POA: Diagnosis not present

## 2016-08-26 DIAGNOSIS — Z951 Presence of aortocoronary bypass graft: Secondary | ICD-10-CM | POA: Insufficient documentation

## 2016-08-26 DIAGNOSIS — Z8249 Family history of ischemic heart disease and other diseases of the circulatory system: Secondary | ICD-10-CM | POA: Diagnosis not present

## 2016-08-26 DIAGNOSIS — I10 Essential (primary) hypertension: Secondary | ICD-10-CM | POA: Insufficient documentation

## 2016-08-26 DIAGNOSIS — I25119 Atherosclerotic heart disease of native coronary artery with unspecified angina pectoris: Secondary | ICD-10-CM | POA: Insufficient documentation

## 2016-08-26 DIAGNOSIS — R9439 Abnormal result of other cardiovascular function study: Secondary | ICD-10-CM | POA: Insufficient documentation

## 2016-08-26 HISTORY — PX: NM MYOVIEW LTD: HXRAD82

## 2016-08-26 LAB — MYOCARDIAL PERFUSION IMAGING
CHL CUP MPHR: 162 {beats}/min
CHL CUP NUCLEAR SDS: 1
CHL CUP NUCLEAR SSS: 7
CSEPED: 11 min
CSEPEW: 12.7 METS
Exercise duration (sec): 41 s
LV dias vol: 96 mL (ref 62–150)
LV sys vol: 48 mL
NUC STRESS TID: 0.86
Peak HR: 146 {beats}/min
Percent HR: 90 %
RPE: 17
Rest HR: 47 {beats}/min
SRS: 6

## 2016-08-26 MED ORDER — TECHNETIUM TC 99M TETROFOSMIN IV KIT
30.3000 | PACK | Freq: Once | INTRAVENOUS | Status: AC | PRN
  Administered 2016-08-26: 30.3 via INTRAVENOUS
  Filled 2016-08-26: qty 31

## 2016-08-26 MED ORDER — TECHNETIUM TC 99M TETROFOSMIN IV KIT
10.7000 | PACK | Freq: Once | INTRAVENOUS | Status: AC | PRN
  Administered 2016-08-26: 10.7 via INTRAVENOUS
  Filled 2016-08-26: qty 11

## 2016-08-28 ENCOUNTER — Telehealth: Payer: Self-pay | Admitting: *Deleted

## 2016-08-28 NOTE — Telephone Encounter (Signed)
-----   Message from Marykay Lex, MD sent at 08/27/2016  6:08 PM EDT ----- This is an abnormal stress test that shows a prior heart attack but no evidence of ischemia. I suspect this is the original heart attack that he had back for his CABG. It will be nice to have his previous stress test result to compare to. Based on the fact there is no evidence ischemia. This is read as a risk presently prior infarction, but in fact there is no evidence of ischemia. 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  More to follow once we are able to see his prior stress test results from Dr. Jacinto Halim.  Bryan Lemma, MD

## 2016-08-28 NOTE — Telephone Encounter (Signed)
Spoke to patient. Result given . Verbalized understanding Patient aware to come to office to sign medical release for records- FROM PREVIOUS STRESS TEST

## 2016-09-03 ENCOUNTER — Ambulatory Visit (INDEPENDENT_AMBULATORY_CARE_PROVIDER_SITE_OTHER): Payer: Managed Care, Other (non HMO) | Admitting: Cardiology

## 2016-09-03 ENCOUNTER — Telehealth: Payer: Self-pay | Admitting: *Deleted

## 2016-09-03 ENCOUNTER — Encounter: Payer: Self-pay | Admitting: Cardiology

## 2016-09-03 ENCOUNTER — Encounter: Payer: Self-pay | Admitting: *Deleted

## 2016-09-03 DIAGNOSIS — E785 Hyperlipidemia, unspecified: Secondary | ICD-10-CM

## 2016-09-03 DIAGNOSIS — I10 Essential (primary) hypertension: Secondary | ICD-10-CM

## 2016-09-03 DIAGNOSIS — Z951 Presence of aortocoronary bypass graft: Secondary | ICD-10-CM | POA: Diagnosis not present

## 2016-09-03 DIAGNOSIS — Z021 Encounter for pre-employment examination: Secondary | ICD-10-CM

## 2016-09-03 DIAGNOSIS — Z79899 Other long term (current) drug therapy: Secondary | ICD-10-CM

## 2016-09-03 DIAGNOSIS — I2510 Atherosclerotic heart disease of native coronary artery without angina pectoris: Secondary | ICD-10-CM | POA: Diagnosis not present

## 2016-09-03 DIAGNOSIS — Z0289 Encounter for other administrative examinations: Secondary | ICD-10-CM

## 2016-09-03 DIAGNOSIS — R7302 Impaired glucose tolerance (oral): Secondary | ICD-10-CM

## 2016-09-03 MED ORDER — ROSUVASTATIN CALCIUM 40 MG PO TABS: 40.0000 mg | ORAL_TABLET | Freq: Every day | ORAL | 3 refills | Status: DC

## 2016-09-03 NOTE — Telephone Encounter (Signed)
-----   Message from Marykay Lex, MD sent at 08/27/2016  6:08 PM EDT ----- This is an abnormal stress test that shows a prior heart attack but no evidence of ischemia. I suspect this is the original heart attack that he had back for his CABG. It will be nice to have his previous stress test result to compare to. Based on the fact there is no evidence ischemia. This is read as a risk presently prior infarction, but in fact there is no evidence of ischemia. 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  More to follow once we are able to see his prior stress test results from Dr. Jacinto Halim.  Bryan Lemma, MD

## 2016-09-03 NOTE — Telephone Encounter (Signed)
PATIENT RECEIVED RESULTS AT OFFICE VISIT 09/03/16

## 2016-09-03 NOTE — Patient Instructions (Addendum)
Medication change   increase rosuvastatin CRESTOR 40 MG at bedtime   No  Other changes with treatment    Labs in OCT 2018 CMP LIPID WILL MAIL LABSLIP ,CLOSER TO October.  Your physician wants you to follow-up in 6 MONTHS WITH DR HARDING. You will receive a reminder letter in the mail two months in advance. If you don't receive a letter, please call our office to schedule the follow-up appointment.   If you need a refill on your cardiac medications before your next appointment, please call your pharmacy.

## 2016-09-03 NOTE — Progress Notes (Signed)
PCP: Cathlean Cower, MD  Clinic Note: Chief Complaint  Patient presents with  . Follow-up    Pt states no Sx.   . Coronary Artery Disease  . Employment Physical    Discuss results of Myoview    HPI: Samuel Hawkins is a 61 y.o. male with a PMH below who presents today for six-month follow-up for history of multivessel CAD status post CABG 4. Cardiac risk factors include smoking, hyperlipidemia, family history of hypertension. He is presenting to discuss the results of his stress test.  Kristoff Coonradt was last seen on 03/09/2016 to establish cardiology care. He was a former patient of Dr. Einar Gip. 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  Recent Hospitalizations: None  Studies Reviewed:   Myoview 08/26/2016: Exercise 11:41 min 12.7 MET S -peak heart rate 162 BPM (90% of max predicted). Normal blood pressure response = excellent exercise capacity..  EF 50%. Medium size, moderate intensity perfusion defect in the basal inferior-basal lateral and inferolateral wall consistent with prior infarction. No evidence of reversibility/ischemia. (Similar to previous study)  Interval History: Samuel Hawkins Presents today doing very well. He is very active. 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 He is always on the go. He has not had any symptoms with the exception of shortness breath when he is running around doing things fast. He feels fine and healthy with no major symptoms. Otherwise from a cardiac review of symptoms standpoint he is essentially negative:  No chest pain or shortness of breath with rest or exertion.   No PND, orthopnea or edema.   No palpitations, lightheadedness, dizziness, weakness or syncope/near syncope.  No TIA/amaurosis fugax symptoms.  No melena, hematochezia, hematuria, or epstaxis.  No claudication.  ROS: A comprehensive was performed. Review of Systems  Constitutional: Negative for malaise/fatigue.   HENT: Negative for congestion.   Respiratory: Negative for cough, shortness of breath and wheezing.   Musculoskeletal: Negative for falls and myalgias.  Endo/Heme/Allergies: Negative for environmental allergies. Does not bruise/bleed easily.  Psychiatric/Behavioral: Negative.   All other systems reviewed and are negative.   Past Medical History:  Diagnosis Date  . CAD (coronary artery disease) 04/2012   Dr. Einar Gip: EF 55%; dominant right with diffuse 80% mid vessel disease. Just prior to bifurcation is a 90% stenosis.  D1 90% & LAD 70% after D1 - FFR positive. Large D2 noted.  Proximal circumflex 90% at OM1, ostial OM1 80%. Subtotal OM 2 with TIMI 1 flow (large vessel with several branches).  Ostial ramus 80%.  . ERECTILE DYSFUNCTION 05/01/2008  . Glucose intolerance (impaired glucose tolerance)    history of  . History of erectile dysfunction   . Hyperlipidemia   . HYPERLIPIDEMIA 05/01/2008  . Impaired glucose tolerance 07/13/2011  . 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    Past Surgical History:  Procedure Laterality Date  . CORONARY ARTERY BYPASS GRAFT  05/04/2011   Procedure: CORONARY ARTERY BYPASS GRAFTING (CABG);  Surgeon: Melrose Nakayama, MD;  Location: [facility];  Service: Open Heart Surgery;  Laterality: N/A;  CABG x  six;  using bilateral  internal mammary arteries and left leg greater saphenous vein harvested endoscopically --LIMA-LAD, RIMA-dRCA, SeqSVG-OM3-OM4  . LEFT HEART CATHETERIZATION WITH CORONARY ANGIOGRAM N/A 04/30/2011   Procedure: LEFT HEART CATHETERIZATION WITH CORONARY ANGIOGRAM;  Surgeon: Laverda Page, MD;  Location: [facility] CATH LAB: EF 55%; dominant right with  diffuse 80% mid vessel disease. Just prior to bifurcation is a 90% stenosis.  D1 90% & LAD 70% after D1 - FFR positive. Large D2 noted.  Proximal circumflex 90% at OM1, ost OM1 80%. Subtotal OM 2 w/ TIMI 1 flow (large w/ several branches), Ost RI 80%  .  NM MYOVIEW LTD  11/2012   Stress EKG showed 1.5 mm ST depression at peak exercise recovering less than 2 minutes into recovery. He exercised for 11:52 min. 12.9 METS. Small-moderate-sized inferior-inferolateral transmural scar without infarction. Mild inferior wall hypokinesis. EF 61%. LOW RISK  . NM MYOVIEW LTD  08/26/2016   Exercise 11:41 min 12.7 MET S -peak heart rate 162 BPM (90% of max predicted). Normal blood pressure response = excellent exercise capacity..  EF 50%. Medium size, moderate intensity perfusion defect in the basal inferior-basal lateral and inferolateral wall consistent with prior infarction. No evidence of reversibility/ischemia. (Similar to previous study)  . TRANSTHORACIC ECHOCARDIOGRAM  04/2001   EF 55-60%. Mild posterior-inferior hypokinesis. GR 1 DD.    Current Meds  Medication Sig  . aspirin 81 MG tablet Take 81 mg by mouth daily.  . carvedilol (COREG) 3.125 MG tablet Take 3.125 mg by mouth 2 (two) times daily.  Marland Kitchen losartan (COZAAR) 50 MG tablet Take 1 tablet by mouth daily.  Marland Kitchen omeprazole (PRILOSEC) 40 MG capsule Take 40 mg by mouth daily.  . [DISCONTINUED] rosuvastatin (CRESTOR) 20 MG tablet Take 1 tablet (20 mg total) by mouth daily.    Allergies  Allergen Reactions  . Atorvastatin Other (See Comments)    REACTION: muscle cramp  . Simvastatin Other (See Comments)    REACTION: muscle cramp    Social History   Social History  . Marital status: Divorced    Spouse name: N/A  . Number of children: N/A  . Years of education: N/A   Occupational History  . [employer] History Main Topics  . Smoking status: Former Smoker    Types: Cigarettes    Quit date: 05/10/2011  . Smokeless tobacco: Never Used     Comment: social smoker only prior to MI  . Alcohol use Yes     Comment: occa  . Drug use: No  . Sexual activity: No   Other Topics Concern  . None   Social History Narrative  . None    family history includes Heart attack in  his brother.  Wt Readings from Last 3 Encounters:  09/03/16 192 lb 3.2 oz (87.2 kg)  08/26/16 203 lb (92.1 kg)  03/09/16 203 lb (92.1 kg)    PHYSICAL EXAM BP 132/87   Pulse 62   Ht _0  (1.702 m)   Wt 192 lb 3.2 oz (87.2 kg)   BMI 30.10 kg/m  General appearance: alert, cooperative, appears stated age, no distress and Borderline/mildly  Obese HEENT: [place]/AT, EOMI, MMM, anicteric sclera Neck: no adenopathy, no carotid bruit and no JVD Lungs: clear to auscultation bilaterally, normal percussion bilaterally and non-labored Heart: regular rate and rhythm, S1 & S2 normal, no murmur, click, rub or gallop; nondisplaced PMI Abdomen: soft, non-tender; bowel sounds normal; no masses,  no organomegaly; no HJR Extremities: extremities normal, atraumatic, no cyanosis, oredema  Pulses: 2+ and symmetric;  Neurologic: Mental status: Alert, oriented, thought content appropriate    Adult ECG Report n/a   Other studies Reviewed: Additional studies/ records that were reviewed today include:  Recent Labs:   Lab Results  Component Value Date   CHOL 134 03/09/2016  HDL 23 (L) 03/09/2016   LDLCALC 83 03/09/2016   LDLDIRECT 176.2 07/10/2010   TRIG 138 03/09/2016   CHOLHDL 5.8 (H) 03/09/2016   Lab Results  Component Value Date   CREATININE 1.10 03/09/2016   BUN 21 03/09/2016   NA 139 03/09/2016   K 4.4 03/09/2016   CL 105 03/09/2016   CO2 23 03/09/2016   ASSESSMENT / PLAN: Problem List Items Addressed This Visit    Coronary artery disease involving native heart without angina pectoris (Chronic)    Initial evaluation with CAD he did have angina, but now no further anginal symptoms. Myoview shows evidence of prior infarct, no ischemia. He is asymptomatic angina or heart failure symptoms. He is on stable dose of beta blocker, ARB as well as aspirin. I am adjusting his rosuvastatin up just a little bit as noted.      Relevant Medications   rosuvastatin (CRESTOR) 40 MG tablet   Encounter  for physical examination related to employment    With his history of coronary disease and MI, he had a recent Myoview stress test done. I reviewed the results.  I was able to review his stress test from Dr. Irven Shelling office. It does appear that he did have a previous infarct noted on that as well. With that in mind, I think current stress test is essentially stable and does not require any further evaluation.   He is totally asymptomatic with no recurrent symptoms of angina, heart failure, arrhythmias or PAD. Although he has evidence of a prior infarct, his ejection fraction is preserved.  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 I actually think in the future he could probably just have a treadmill test done for future evaluations based on how well he performed on the treadmill portion..      Essential hypertension (Chronic)    Well-controlled on carvedilol and losartan.      Relevant Medications   rosuvastatin (CRESTOR) 40 MG tablet   Other Relevant Orders   Comprehensive metabolic panel   Hyperlipidemia with target LDL less than 70 (Chronic)    Borderline control with current dose of Crestor. I recommend that he increase his dose to 40 mg every other day to see if he can bring it all the way down below 70. Recheck in October.      Relevant Medications   rosuvastatin (CRESTOR) 40 MG tablet   Other Relevant Orders   Lipid panel   Comprehensive metabolic panel   Impaired glucose tolerance    Lab Results  Component Value Date   HGBA1C 5.8 (H) 03/09/2016   Relatively decent glycemic control.      S/P CABG (coronary artery bypass graft) (Chronic)    Now he is at a Myoview in 2014 and 2018 relatively stable. Clear evidence of prior infarct, but no ischemia.  He would not need to have a stress test done a cardiology standpoint for the for 5 years as far as imaging studies go. For [place] license he can simply just have a treadmill  stress test.       Other Visit Diagnoses    Medication management       Relevant Orders   Lipid panel   Comprehensive metabolic panel      Current medicines are reviewed at length with the patient today. (+/- concerns) n/a The following changes have been made: see below  Patient Instructions  Medication change   increase rosuvastatin CRESTOR  40 MG at bedtime   No  Other changes with treatment    Labs in OCT 2018 [place] ,CLOSER TO October.  Your physician wants you to follow-up in [place]. You will receive a reminder letter in the mail two months in advance. If you don't receive a letter, please call our office to schedule the follow-up appointment.   If you need a refill on your cardiac medications before your next appointment, please call your pharmacy.    Studies Ordered:   Orders Placed This Encounter  Procedures  . Lipid panel  . Comprehensive metabolic panel      Glenetta Hew, M.D., M.S. Interventional Cardiologist   Pager # [phone] Phone # [phone] [address] [place], [place] [address]

## 2016-09-05 ENCOUNTER — Encounter: Payer: Self-pay | Admitting: Cardiology

## 2016-09-05 NOTE — Assessment & Plan Note (Signed)
Now he is at a Myoview in 2014 and 2018 relatively stable. Clear evidence of prior infarct, but no ischemia.  He would not need to have a stress test done a cardiology standpoint for the for 5 years as far as imaging studies go. 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

## 2016-09-05 NOTE — Assessment & Plan Note (Signed)
Lab Results  Component Value Date   HGBA1C 5.8 (H) 03/09/2016   Relatively decent glycemic control.

## 2016-09-05 NOTE — Assessment & Plan Note (Signed)
Initial evaluation with CAD he did have angina, but now no further anginal symptoms. Myoview shows evidence of prior infarct, no ischemia. He is asymptomatic angina or heart failure symptoms. He is on stable dose of beta blocker, ARB as well as aspirin. I am adjusting his rosuvastatin up just a little bit as noted.

## 2016-09-05 NOTE — Assessment & Plan Note (Signed)
With his history of coronary disease and MI, he had a recent Myoview stress test done. I reviewed the results.  I was able to review his stress test from Dr. Verl Dicker office. It does appear that he did have a previous infarct noted on that as well. With that in mind, I think current stress test is essentially stable and does not require any further evaluation.   He is totally asymptomatic with no recurrent symptoms of angina, heart failure, arrhythmias or PAD. Although he has evidence of a prior infarct, his ejection fraction is preserved.  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 I actually think in the future he could probably just have a treadmill test done for future evaluations based on how well he performed on the treadmill portion.Marland Kitchen

## 2016-09-05 NOTE — Assessment & Plan Note (Signed)
Well-controlled on carvedilol and losartan.

## 2016-09-05 NOTE — Assessment & Plan Note (Addendum)
Borderline control with current dose of Crestor. I recommend that he increase his dose to 40 mg every other day to see if he can bring it all the way down below 70. Recheck in October.

## 2016-12-10 ENCOUNTER — Encounter (INDEPENDENT_AMBULATORY_CARE_PROVIDER_SITE_OTHER): Payer: Self-pay | Admitting: Family Medicine

## 2017-01-08 ENCOUNTER — Telehealth: Payer: Self-pay | Admitting: Cardiology

## 2017-01-08 MED ORDER — CARVEDILOL 3.125 MG PO TABS: 3.1250 mg | ORAL_TABLET | Freq: Two times a day (BID) | ORAL | 1 refills | Status: DC

## 2017-01-08 MED ORDER — OMEPRAZOLE 40 MG PO CPDR: 40.0000 mg | DELAYED_RELEASE_CAPSULE | Freq: Every day | ORAL | 1 refills | Status: DC

## 2017-01-08 NOTE — Telephone Encounter (Signed)
Contacted Patient to inform Pt medications are refilled at the requested pharmacy.

## 2017-01-08 NOTE — Telephone Encounter (Signed)
New Message   *STAT* If patient is at the pharmacy, call can be transferred to refill team.   1. Which medications need to be refilled? (please list name of each medication and dose if known) Carvedilol 3.125 mg..... Omeprazole 40mg    2. Which pharmacy/location (including street and city if local pharmacy) is medication to be sent to? [facility]  3. Do they need a 30 day or 90 day supply? 90

## 2017-01-14 ENCOUNTER — Telehealth: Payer: Self-pay | Admitting: Cardiology

## 2017-01-14 NOTE — Telephone Encounter (Signed)
Biox4 may cause some elevation in blood pressure due to amount of green tea in the capsule. All other ingredients should be perfectly same to use.  Okay to use but recommend to monitor BP for 2-3 weeks to assure control.

## 2017-01-14 NOTE — Telephone Encounter (Signed)
Returned call to patient advised I will send message to our pharmacist to ask if ok to take Biox4.

## 2017-01-14 NOTE — Telephone Encounter (Signed)
New message      Can he take OTC medication BioX4 , it is a vitamin supplement to help break down fat? Is it ok to take with his heart medication?

## 2017-01-19 NOTE — Telephone Encounter (Signed)
F/u Message  Pt call to f/u on taking the supplement. Please call back to discuss

## 2017-01-19 NOTE — Telephone Encounter (Signed)
Spoke to patient regarding recommendations. He voiced he does not have home BP cuff and needed some help in interpreting BP readings. He does know he is able to go to pharmacy and get BP checked there on machine. Advised him to do so, at least 2-3 times a week (preferably daily) for next 2 weeks, write down readings, call back w the readings so that we can review. Pt verbalized understanding, and voiced thanks for f/u. Aware to call back in 2 weeks but sooner if he has new concerns.

## 2017-02-04 ENCOUNTER — Telehealth: Payer: Self-pay | Admitting: *Deleted

## 2017-02-04 DIAGNOSIS — E785 Hyperlipidemia, unspecified: Secondary | ICD-10-CM

## 2017-02-04 DIAGNOSIS — Z79899 Other long term (current) drug therapy: Secondary | ICD-10-CM

## 2017-02-04 NOTE — Telephone Encounter (Signed)
-----   Message from Tobin Chad, RN sent at 09/03/2016  2:37 PM EDT ----- CMP,LIPID 03/05/17 DUE  MAIL  LAB SLIP /19/18

## 2017-02-04 NOTE — Telephone Encounter (Signed)
Mail letter and labslip  

## 2017-03-05 LAB — COMPREHENSIVE METABOLIC PANEL
A/G RATIO: 1.4 (ref 1.2–2.2)
ALBUMIN: 4.2 g/dL (ref 3.5–5.5)
ALT: 26 IU/L (ref 0–44)
AST: 28 IU/L (ref 0–40)
Alkaline Phosphatase: 86 IU/L (ref 39–117)
BILIRUBIN TOTAL: 0.6 mg/dL (ref 0.0–1.2)
BUN/Creatinine Ratio: 12 (ref 9–20)
BUN: 12 mg/dL (ref 6–24)
CHLORIDE: 104 mmol/L (ref 96–106)
CO2: 21 mmol/L (ref 20–29)
Calcium: 8.8 mg/dL (ref 8.7–10.2)
Creatinine, Ser: 1.04 mg/dL (ref 0.76–1.27)
GFR, EST AFRICAN AMERICAN: 91 mL/min/{1.73_m2} (ref >59)
GFR, EST NON AFRICAN AMERICAN: 79 mL/min/{1.73_m2} (ref >59)
Globulin, Total: 2.9 g/dL (ref 1.5–4.5)
Glucose: 100 mg/dL — ABNORMAL HIGH (ref 65–99)
Potassium: 4.4 mmol/L (ref 3.5–5.2)
Sodium: 140 mmol/L (ref 134–144)
Total Protein: 7.1 g/dL (ref 6.0–8.5)

## 2017-03-05 LAB — LIPID PANEL
CHOLESTEROL TOTAL: 159 mg/dL (ref 100–199)
Chol/HDL Ratio: 5.9 ratio — ABNORMAL HIGH (ref 0.0–5.0)
HDL: 27 mg/dL — AB (ref >39)
LDL Calculated: 106 mg/dL — ABNORMAL HIGH (ref 0–99)
TRIGLYCERIDES: 132 mg/dL (ref 0–149)
VLDL Cholesterol Cal: 26 mg/dL (ref 5–40)

## 2017-03-30 ENCOUNTER — Other Ambulatory Visit: Payer: Self-pay | Admitting: *Deleted

## 2017-03-30 ENCOUNTER — Telehealth: Payer: Self-pay | Admitting: Cardiology

## 2017-03-30 MED ORDER — LOSARTAN POTASSIUM 50 MG PO TABS: 50.0000 mg | ORAL_TABLET | Freq: Every day | ORAL | 5 refills | Status: DC

## 2017-03-30 NOTE — Telephone Encounter (Signed)
New message     *STAT* If patient is at the pharmacy, call can be transferred to refill team.   1. Which medications need to be refilled? (please list name of each medication and dose if known) losartan (COZAAR) 50 MG tablet  2. Which pharmacy/location (including street and city if local pharmacy) [facility] [place]  3. Do they need a 30 day or 90 day supply? 30

## 2017-03-30 NOTE — Telephone Encounter (Signed)
New message    Patient calling for labs results. Please call

## 2017-03-31 NOTE — Telephone Encounter (Signed)
Notes recorded by Marykay LexHarding, David W, MD on 03/23/2017 at 8:52 PM EST Lab results: Unfortunately, it looks like the cholesterol level has gotten worse. Need to make sure that he is truly taking his Crestor 40 mg daily. If so, we may need to consider referral for PCSK9.  Chemistry panel looks relatively stable. Kidney function and potassium levels are all stable. Liver function tests are stable  Bryan Lemmaavid Harding, MD

## 2017-03-31 NOTE — Telephone Encounter (Signed)
Patient called w/results. He states he is not taking crestor 40mg  daily. Advised this is MD recommendation. Informed him MD will review w/him at his appt on 04/12/17. He voiced understanding

## 2017-04-12 ENCOUNTER — Ambulatory Visit: Payer: Managed Care, Other (non HMO) | Admitting: Cardiology

## 2017-04-14 IMAGING — NM NM MISC PROCEDURE
9 series · 54 of 54 positions shown · non-contrast
Comparison: none

[Series 1: wbr rest · 6.40mm/px · 6 of 64 frames shown]
[frame 6/64]
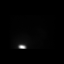
[frame 16/64]
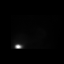
[frame 27/64]
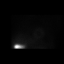
[frame 38/64]
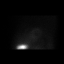
[frame 48/64]
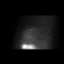
[frame 59/64]
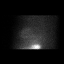

[Series 1: wbr_r-proj_st wbr rest · 6.40mm/px · 6 of 64 frames shown]
[frame 6/64]
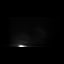
[frame 16/64]
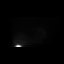
[frame 27/64]
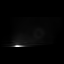
[frame 38/64]
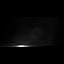
[frame 48/64]
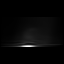
[frame 59/64]
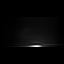

[Series 1: rest sax · 6.4mm · 6.40mm/px · 6 of 23 frames shown]
[frame 2/23]
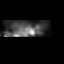
[frame 6/23]
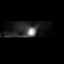
[frame 10/23]
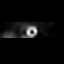
[frame 14/23]
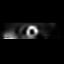
[frame 18/23]
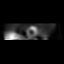
[frame 22/23]
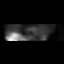

[Series 2: wbr_s-proj_st wbr stress-gsp · 6.40mm/px · 6 of 512 frames shown]
[frame 43/512]
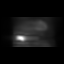
[frame 128/512]
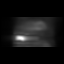
[frame 214/512]
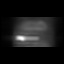
[frame 299/512]
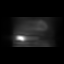
[frame 384/512]
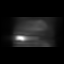
[frame 470/512]
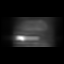

[Series 2: stress sax gs · 6.4mm · 6.40mm/px · 6 of 176 frames shown]
[frame 15/176]
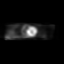
[frame 44/176]
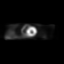
[frame 74/176]
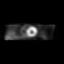
[frame 103/176]
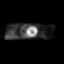
[frame 132/176]
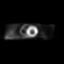
[frame 162/176]
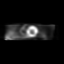

[Series 2: wbr stress-gsp · 6.40mm/px · 6 of 512 frames shown]
[frame 43/512]
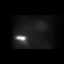
[frame 128/512]
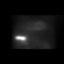
[frame 214/512]
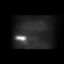
[frame 299/512]
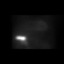
[frame 384/512]
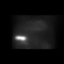
[frame 470/512]
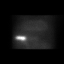

[Series 2: stress sax · 6.4mm · 6.40mm/px · 6 of 22 frames shown]
[frame 2/22]
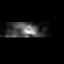
[frame 6/22]
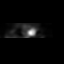
[frame 10/22]
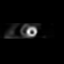
[frame 13/22]
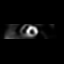
[frame 17/22]
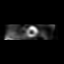
[frame 21/22]
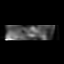

[Series 3: wbr_s-proj_st wbr stress-sum-em · 6.40mm/px · 6 of 64 frames shown]
[frame 6/64]
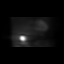
[frame 16/64]
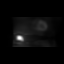
[frame 27/64]
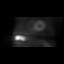
[frame 38/64]
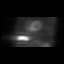
[frame 48/64]
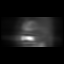
[frame 59/64]
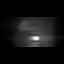

[Series 3: wbr stress-sum-em · 6.40mm/px · 6 of 64 frames shown]
[frame 6/64]
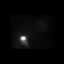
[frame 16/64]
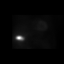
[frame 27/64]
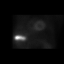
[frame 38/64]
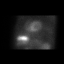
[frame 48/64]
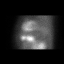
[frame 59/64]
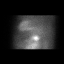

[54 of 54 positions shown; findings below may reference images not displayed]

Canned report from images found in remote index.

Refer to host system for actual result text.

## 2017-07-14 ENCOUNTER — Telehealth: Payer: Self-pay | Admitting: Cardiology

## 2017-07-14 NOTE — Telephone Encounter (Signed)
Pt c/o medication issue: °1. Name of Medication:  °Losartan °2. How are you currently taking this medication (dosage and times per day)? °3. Are you having a reaction (difficulty breathing--STAT)?  °4. What is your medication issue?  ° ° °

## 2017-07-14 NOTE — Telephone Encounter (Signed)
Returned call to patient who states he was informed his losartan was causing cancer. Advised that patient call his dispensing pharmacy to determine if he particular med [facility] lot # was recalled.   Also reminded patient of his MD OV on March 4 @ 1020am

## 2017-07-19 ENCOUNTER — Encounter: Payer: Self-pay | Admitting: Cardiology

## 2017-07-19 ENCOUNTER — Ambulatory Visit (INDEPENDENT_AMBULATORY_CARE_PROVIDER_SITE_OTHER): Payer: Managed Care, Other (non HMO) | Admitting: Cardiology

## 2017-07-19 VITALS — BP 132/85 | HR 50 | Ht 67.0 in | Wt 197.8 lb

## 2017-07-19 DIAGNOSIS — Z951 Presence of aortocoronary bypass graft: Secondary | ICD-10-CM | POA: Diagnosis not present

## 2017-07-19 DIAGNOSIS — I10 Essential (primary) hypertension: Secondary | ICD-10-CM | POA: Diagnosis not present

## 2017-07-19 DIAGNOSIS — E785 Hyperlipidemia, unspecified: Secondary | ICD-10-CM

## 2017-07-19 DIAGNOSIS — I2510 Atherosclerotic heart disease of native coronary artery without angina pectoris: Secondary | ICD-10-CM | POA: Diagnosis not present

## 2017-07-19 LAB — COMPREHENSIVE METABOLIC PANEL
ALBUMIN: 4.4 g/dL (ref 3.5–5.5)
ALT: 37 IU/L (ref 0–44)
AST: 37 IU/L (ref 0–40)
Albumin/Globulin Ratio: 1.6 (ref 1.2–2.2)
Alkaline Phosphatase: 77 IU/L (ref 39–117)
BUN / CREAT RATIO: 20 (ref 9–20)
BUN: 21 mg/dL (ref 6–24)
Bilirubin Total: 0.4 mg/dL (ref 0.0–1.2)
CALCIUM: 9.6 mg/dL (ref 8.7–10.2)
CO2: 20 mmol/L (ref 20–29)
Chloride: 105 mmol/L (ref 96–106)
Creatinine, Ser: 1.03 mg/dL (ref 0.76–1.27)
GFR, EST AFRICAN AMERICAN: 91 mL/min/{1.73_m2} (ref >59)
GFR, EST NON AFRICAN AMERICAN: 79 mL/min/{1.73_m2} (ref >59)
GLOBULIN, TOTAL: 2.8 g/dL (ref 1.5–4.5)
Glucose: 97 mg/dL (ref 65–99)
Potassium: 4.7 mmol/L (ref 3.5–5.2)
SODIUM: 139 mmol/L (ref 134–144)
TOTAL PROTEIN: 7.2 g/dL (ref 6.0–8.5)

## 2017-07-19 LAB — LIPID PANEL
CHOL/HDL RATIO: 4.3 ratio (ref 0.0–5.0)
Cholesterol, Total: 134 mg/dL (ref 100–199)
HDL: 31 mg/dL — ABNORMAL LOW (ref >39)
LDL Calculated: 84 mg/dL (ref 0–99)
Triglycerides: 94 mg/dL (ref 0–149)
VLDL Cholesterol Cal: 19 mg/dL (ref 5–40)

## 2017-07-19 MED ORDER — COQ10 100 MG PO CAPS: 300.0000 mg | ORAL_CAPSULE | Freq: Every day | ORAL | 3 refills | Status: DC

## 2017-07-19 NOTE — Progress Notes (Signed)
PCP: Biagio Borg, MD  Clinic Note: Chief Complaint  Patient presents with  . Follow-up    Borderline annual follow-up  . Coronary Artery Disease    CABG.  No real symptoms    HPI: Samuel Hawkins is a 62 y.o. male with a PMH below who presents today for borderline annual follow-up for  multivessel CAD status post CABG x4.  He is a former patient of Dr. Einar Gip who switched to [facility].  Cardiac risk factors include smoking, hyperlipidemia, family history of hypertension.   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   Recent Hospitalizations: None  Studies Reviewed:   none  Interval History: Samuel Hawkins Presents today overall doing fairly well.  The only thing he really notes is that he gets a little bit tired easier than normal with exertion.  He denies any dyspnea or chest tightness/pressure with rest or exertion --if he is really running around doing things and carrying heavy  loads..  No resting dyspnea or chest discomfort.  No heart failure symptoms of PND, orthopnea or edema.  No rapid irregular heartbeats or palpitations.  No syncope/near syncope or TIA Looks/Amaurosis Fugax.  No Claudication He noted blood in his stool once after he wiped and is not had any since.  No other melena, hematochezia or hematuria.  With the exception of getting some shortness of breath  ROS: A comprehensive was performed. Review of Systems  Constitutional: Negative for malaise/fatigue.  HENT: Negative for congestion.   Respiratory: Negative for cough, shortness of breath and wheezing.   Musculoskeletal: Negative for falls and myalgias.  Neurological: Negative for dizziness.  Endo/Heme/Allergies: Negative for environmental allergies. Does not bruise/bleed easily.  Psychiatric/Behavioral: Negative.  Negative for memory loss. The patient is not nervous/anxious and does not have insomnia.   All other systems reviewed and are  negative.   Past Medical History:  Diagnosis Date  . CAD (coronary artery disease) 04/2012   Dr. Einar Gip: EF 55%; dominant right with diffuse 80% mid vessel disease. Just prior to bifurcation is a 90% stenosis.  D1 90% & LAD 70% after D1 - FFR positive. Large D2 noted.  Proximal circumflex 90% at OM1, ostial OM1 80%. Subtotal OM 2 with TIMI 1 flow (large vessel with several branches).  Ostial ramus 80%.  . ERECTILE DYSFUNCTION 05/01/2008  . Glucose intolerance (impaired glucose tolerance)    history of  . History of erectile dysfunction   . Hyperlipidemia   . HYPERLIPIDEMIA 05/01/2008  . Impaired glucose tolerance 07/13/2011  . 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    Past Surgical History:  Procedure Laterality Date  . CORONARY ARTERY BYPASS GRAFT  05/04/2011   Procedure: CORONARY ARTERY BYPASS GRAFTING (CABG);  Surgeon: Melrose Nakayama, MD;  Location: [facility];  Service: Open Heart Surgery;  Laterality: N/A;  CABG x  six;  using bilateral  internal mammary arteries and left leg greater saphenous vein harvested endoscopically --LIMA-LAD, RIMA-dRCA, SeqSVG-OM3-OM4  . LEFT HEART CATHETERIZATION WITH CORONARY ANGIOGRAM N/A 04/30/2011   Procedure: LEFT HEART CATHETERIZATION WITH CORONARY ANGIOGRAM;  Surgeon: Laverda Page, MD;  Location: [facility] CATH LAB: EF 55%; dominant right with diffuse 80% mid vessel disease. Just prior to bifurcation is a 90% stenosis.  D1 90% & LAD 70% after D1 - FFR positive. Large D2 noted.  Proximal circumflex 90% at OM1, ost OM1 80%. Subtotal OM 2 w/  TIMI 1 flow (large w/ several branches), Ost RI 80%  . NM MYOVIEW LTD  11/2012   Stress EKG showed 1.5 mm ST depression at peak exercise recovering less than 2 minutes into recovery. He exercised for 11:52 min. 12.9 METS. Small-moderate-sized inferior-inferolateral transmural scar without infarction. Mild inferior wall hypokinesis. EF 61%. LOW RISK  . NM MYOVIEW LTD   08/26/2016   Exercise 11:41 min 12.7 MET S -peak heart rate 162 BPM (90% of max predicted). Normal blood pressure response = excellent exercise capacity..  EF 50%. Medium size, moderate intensity perfusion defect in the basal inferior-basal lateral and inferolateral wall consistent with prior infarction. No evidence of reversibility/ischemia. (Similar to previous study)  . TRANSTHORACIC ECHOCARDIOGRAM  04/2001   EF 55-60%. Mild posterior-inferior hypokinesis. GR 1 DD.     Current Meds  Medication Sig  . aspirin 81 MG tablet Take 81 mg by mouth daily.  . carvedilol (COREG) 3.125 MG tablet Take 1 tablet (3.125 mg total) by mouth 2 (two) times daily.  Marland Kitchen losartan (COZAAR) 50 MG tablet Take 1 tablet (50 mg total) daily by mouth.  Marland Kitchen omeprazole (PRILOSEC) 40 MG capsule Take 1 capsule (40 mg total) by mouth daily.    Allergies  Allergen Reactions  . Atorvastatin Other (See Comments)    REACTION: muscle cramp  . Simvastatin Other (See Comments)    REACTION: muscle cramp   Social History   Tobacco Use  . Smoking status: Former Smoker    Types: Cigarettes    Last attempt to quit: 05/10/2011    Years since quitting: 6.2  . Smokeless tobacco: Never Used  . Tobacco comment: social smoker only prior to MI  Substance Use Topics  . Alcohol use: Yes    Comment: occa  . Drug use: No   Social History   Social History Narrative  . Not on file    family history includes Heart attack in his brother.  Wt Readings from Last 3 Encounters:  07/19/17 197 lb 12.8 oz (89.7 kg)  09/03/16 192 lb 3.2 oz (87.2 kg)  08/26/16 203 lb (92.1 kg)    PHYSICAL EXAM BP 132/85   Pulse (!) 50   Ht _0  (1.702 m)   Wt 197 lb 12.8 oz (89.7 kg)   BMI 30.98 kg/m   Physical Exam  Constitutional: He is oriented to person, place, and time. He appears well-developed and well-nourished.   alert, cooperative, appears stated age, no distress & mildly  Obese  HENT:  Head: Normocephalic and atraumatic.  Eyes:  EOM are normal.  Neck: Normal range of motion. Neck supple. No hepatojugular reflux and no JVD present. Carotid bruit is not present.  Cardiovascular: Normal rate, regular rhythm, normal heart sounds and intact distal pulses.  No extrasystoles are present. PMI is not displaced. Exam reveals no gallop and no friction rub.  No murmur heard. Pulmonary/Chest: Effort normal and breath sounds normal. No respiratory distress. He has no wheezes. He has no rales.  Abdominal: Soft. Bowel sounds are normal. He exhibits no distension. There is no tenderness. There is no rebound.  Musculoskeletal: Normal range of motion. He exhibits no edema.  Neurological: He is alert and oriented to person, place, and time.  Skin: He is not diaphoretic.  Nursing note and vitals reviewed.    Adult ECG Report  Rate: 50;  Rhythm: sinus bradycardia; otherwise normal axis, intervals and durations.  Narrative Interpretation: Stable   Other studies Reviewed: Additional studies/ records that were reviewed today include:  Recent Labs:   Lab Results  Component Value Date   CHOL 134 07/19/2017   HDL 31 (L) 07/19/2017   LDLCALC 84 07/19/2017   LDLDIRECT 176.2 07/10/2010   TRIG 94 07/19/2017   CHOLHDL 4.3 07/19/2017  - titrated Crestor to 40 mg after October 2019 follow-up showed LDL of 106 50 . Lab Results  Component Value Date   CREATININE 1.03 07/19/2017   BUN 21 07/19/2017   NA 139 07/19/2017   K 4.7 07/19/2017   CL 105 07/19/2017   CO2 20 07/19/2017   ASSESSMENT / PLAN: Problem List Items Addressed This Visit    Coronary artery disease involving native heart without angina pectoris - Primary (Chronic)    No further symptoms to suggest any angina.  Only some mild fatigue.   Otherwise no real angina or heart failure symptoms. Pretty much on stable dose of beta-blocker which is very low and I would prefer not to titrate off.  Is on stable losartan and Crestor along with aspirin. We have checked a lipid panel  today.  Noted above.  Significant improvement having switch to Crestor.  Getting closer to goal now.  Continue current dose      Relevant Orders   EKG 12-Lead (Completed)   Comprehensive metabolic panel (Completed)   Essential hypertension (Chronic)    Blood pressure looks good on current meds.  No change      Hyperlipidemia with target LDL less than 70 (Chronic)    Check lipids today.  Noted above in chart.  Continue current dose of Crestor, because of some fatigue issues may be related to Crestor dosing, will add co-Q10 up to 300 mg daily.      Relevant Orders   Lipid panel (Completed)   Comprehensive metabolic panel (Completed)   S/P CABG (coronary artery bypass graft) (Chronic)    Stable Myoview in 2014 and 2018.  Clear evidence of prior infarct but no ischemia. 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  I am waiting to see when this is due.      Relevant Orders   EKG 12-Lead (Completed)      Current medicines are reviewed at length with the patient today. (+/- concerns) n/a The following changes have been made: see below  Patient Instructions  MEDICATIONS INSTRUCTIONS  START CoQ10 300 MG -- START TAKING 100 MG DAILY FOR 1 WEEK THEN INCREASE BY 100 MG WEEKLY UNTIL YOU REACH 300 MG DAILY.    LABS TODAY CMP LIPID DEPENDING ON RESULT , MAY HAVE REFER TO CVRR   Your physician wants you to follow-up in [place].You will receive a reminder letter in the mail two months in advance. If you don't receive a letter, please call our office to schedule the follow-up appointment.     If you need a refill on your cardiac medications before your next appointment, please call your pharmacy.    Studies Ordered:   Orders Placed This Encounter  Procedures  . Lipid panel  . Comprehensive metabolic panel  . EKG 12-Lead      Glenetta Hew, M.D., M.S. Interventional Cardiologist   Pager #  [phone] Phone # [phone] [address] [place], [place] [address]

## 2017-07-19 NOTE — Patient Instructions (Addendum)
MEDICATIONS INSTRUCTIONS  START CoQ10 300 MG -- START TAKING 100 MG DAILY FOR 1 WEEK THEN INCREASE BY 100 MG WEEKLY UNTIL YOU REACH 300 MG DAILY.    LABS TODAY CMP LIPID DEPENDING ON RESULT , MAY HAVE REFER TO CVRR   Your physician wants you to follow-up in 12 MONTHS WITH DR HARDING.You will receive a reminder letter in the mail two months in advance. If you don't receive a letter, please call our office to schedule the follow-up appointment.     If you need a refill on your cardiac medications before your next appointment, please call your pharmacy.

## 2017-07-21 ENCOUNTER — Encounter: Payer: Self-pay | Admitting: Cardiology

## 2017-07-21 NOTE — Assessment & Plan Note (Signed)
Stable Myoview in 2014 and 2018.  Clear evidence of prior infarct but no ischemia. 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  I am waiting to see when this is due.

## 2017-07-21 NOTE — Assessment & Plan Note (Signed)
No further symptoms to suggest any angina.  Only some mild fatigue.   Otherwise no real angina or heart failure symptoms. Pretty much on stable dose of beta-blocker which is very low and I would prefer not to titrate off.  Is on stable losartan and Crestor along with aspirin. We have checked a lipid panel today.  Noted above.  Significant improvement having switch to Crestor.  Getting closer to goal now.  Continue current dose

## 2017-07-21 NOTE — Assessment & Plan Note (Signed)
Check lipids today.  Noted above in chart.  Continue current dose of Crestor, because of some fatigue issues may be related to Crestor dosing, will add co-Q10 up to 300 mg daily.

## 2017-07-21 NOTE — Assessment & Plan Note (Signed)
Blood pressure looks good on current meds.  No change. 

## 2017-08-06 ENCOUNTER — Telehealth: Payer: Self-pay | Admitting: Cardiology

## 2017-08-06 NOTE — Telephone Encounter (Signed)
Returned call to patient Dr.Harding's recommendations given.Advised to call back if continues to have pain and cramping in legs.

## 2017-08-06 NOTE — Telephone Encounter (Signed)
Returned call to patient he stated he cannot take crestor 40 mg.Stated he is taking coq10 200 mg and he is still having cramping and pain in lower legs.Advised I will send message to Dr.Harding for advice.

## 2017-08-06 NOTE — Telephone Encounter (Signed)
Lets have him increase CoQ10 to 300mg . Take Crestor: 40 mg on M-W-F & 20 mg T,Th,Sa & Sun. -- see if he can tolerate.  If not - will need to add another medication - Zetia 10 mg.  Bryan Lemmaavid Massiah Longanecker, MD

## 2017-08-06 NOTE — Telephone Encounter (Signed)
New message    Pt c/o medication issue:  1. Name of Medication:  Coenzyme Q10 (COQ10) 100 MG CAPS Take 300 mg by mouth daily.   rosuvastatin (CRESTOR) 40 MG tablet(Expired) Take 1 tablet (40 mg total) by mouth daily.    2. How are you currently taking this medication (dosage and times per day)?  He is taking thesse 2 pills together   3. Are you having a reaction (difficulty breathing--STAT)?  yes  4. What is your medication issue?  Patient states his legs are cramping when he take this medication

## 2017-08-07 ENCOUNTER — Other Ambulatory Visit: Payer: Self-pay | Admitting: Cardiology

## 2017-08-17 ENCOUNTER — Telehealth: Payer: Self-pay | Admitting: Cardiology

## 2017-08-17 NOTE — Telephone Encounter (Signed)
New message    Pt c/o medication issue:  1. Name of Medication:  rosuvastatin (CRESTOR) 40 MG tablet Take 40 mg on Mon-Wed-Fri all other days take 20 mg      Coenzyme Q10 (COQ10) 100 MG CAPS Take 300 mg by mouth daily.     2. How are you currently taking this medication (dosage and times per day)?  As prescribed   3. Are you having a reaction (difficulty breathing--STAT)?  yes  4. What is your medication issue?  Having to take pain medication to stop the cramping and pain from this medication , would like you to change him to something else

## 2017-08-17 NOTE — Telephone Encounter (Signed)
Just have him hold x 1 wk & restart @ 20 mg daily. If OK with that dosing - we will need to add Zetia & set up Appt with CVRR.   Bryan Lemmaavid Harding, MD

## 2017-08-17 NOTE — Telephone Encounter (Signed)
Pt called and is complaining of leg cramps affecting sleep. Instructed pt to hold Crestor and call in couple of weeks with update on symptoms. Pt wil continue with the coq10 300 mg daily.Will forward to Dr Herbie BaltimoreHarding for review

## 2017-08-19 MED ORDER — EZETIMIBE 10 MG PO TABS: 10.0000 mg | ORAL_TABLET | Freq: Every day | ORAL | 6 refills | Status: DC

## 2017-08-19 MED ORDER — ROSUVASTATIN CALCIUM 40 MG PO TABS: ORAL_TABLET | ORAL | 3 refills | Status: DC

## 2017-08-19 NOTE — Telephone Encounter (Signed)
SPOKE TO PATIENT - PATIENT AWARE TO HOLD CRESTOR FOR 7 DAYS THEN RESTART 20 MG DAILY after taking medication for 2 weeks with no issue start zetia 10 mg . continue all other medications  appointment schedule with cvrr 10/05/17

## 2017-08-27 ENCOUNTER — Other Ambulatory Visit: Payer: Self-pay

## 2017-08-27 ENCOUNTER — Emergency Department (HOSPITAL_BASED_OUTPATIENT_CLINIC_OR_DEPARTMENT_OTHER)
Admission: EM | Admit: 2017-08-27 | Discharge: 2017-08-27 | Disposition: A | Discharge status: Home / Self Care | Payer: Managed Care, Other (non HMO) | Attending: Emergency Medicine | Admitting: Emergency Medicine

## 2017-08-27 ENCOUNTER — Encounter (HOSPITAL_BASED_OUTPATIENT_CLINIC_OR_DEPARTMENT_OTHER): Payer: Self-pay

## 2017-08-27 DIAGNOSIS — R070 Pain in throat: Secondary | ICD-10-CM | POA: Diagnosis present

## 2017-08-27 DIAGNOSIS — K219 Gastro-esophageal reflux disease without esophagitis: Secondary | ICD-10-CM | POA: Diagnosis not present

## 2017-08-27 DIAGNOSIS — I10 Essential (primary) hypertension: Secondary | ICD-10-CM | POA: Diagnosis not present

## 2017-08-27 DIAGNOSIS — Z7982 Long term (current) use of aspirin: Secondary | ICD-10-CM | POA: Diagnosis not present

## 2017-08-27 DIAGNOSIS — Z87891 Personal history of nicotine dependence: Secondary | ICD-10-CM | POA: Diagnosis not present

## 2017-08-27 DIAGNOSIS — Z79899 Other long term (current) drug therapy: Secondary | ICD-10-CM | POA: Insufficient documentation

## 2017-08-27 DIAGNOSIS — R197 Diarrhea, unspecified: Secondary | ICD-10-CM | POA: Insufficient documentation

## 2017-08-27 DIAGNOSIS — I259 Chronic ischemic heart disease, unspecified: Secondary | ICD-10-CM | POA: Diagnosis not present

## 2017-08-27 HISTORY — DX: Essential (primary) hypertension: I10

## 2017-08-27 MED ORDER — GI COCKTAIL ~~LOC~~
30.0000 mL | Freq: Once | ORAL | Status: AC
  Administered 2017-08-27: 30 mL via ORAL
  Filled 2017-08-27: qty 30

## 2017-08-27 NOTE — ED Provider Notes (Signed)
[facility] EMERGENCY DEPARTMENT Provider Note   CSN: [id number] Arrival date & time: 08/27/17  1741 History   Chief Complaint Chief Complaint  Patient presents with  . Gastroesophageal Reflux    HPI Samuel Hawkins is a 62 y.o. male presenting with throat burning and diarrhea. PMH GERD, HTN, HLD, CAD.   HPI  Patient presents with throat burning beginning last night. Pain begins in mid epigastric region and extends to his throat. Has felt need to regurgitate at times.   Patient presents with throat burning beginning last night. Burning sensation in chest. Feels like it moves up his throat when he lays down. Took two omeprazole tabs with no improvement in sx. Diarrhea beginning this AM.  Has not eaten since last night but is very hungry. Ate two bananas, apple, orange, and grapes, then sandwich with beans. This is worse episode than he's had in the past. One episode of difficulty catching breath completely. Maybe 10 episodes of diarrhea. Watery stools. Urinating normally. No fevers. Feels like regurg but no actual vomiting. No abd pain. Used to have GI but hasn't seen in a long time. Is unsure why he was being seen by t hen. Has had a colonoscopy.   Past Medical History:  Diagnosis Date  . CAD (coronary artery disease) 04/2012   Dr. Einar Gip: EF 55%; dominant right with diffuse 80% mid vessel disease. Just prior to bifurcation is a 90% stenosis.  D1 90% & LAD 70% after D1 - FFR positive. Large D2 noted.  Proximal circumflex 90% at OM1, ostial OM1 80%. Subtotal OM 2 with TIMI 1 flow (large vessel with several branches).  Ostial ramus 80%.  . ERECTILE DYSFUNCTION 05/01/2008  . Glucose intolerance (impaired glucose tolerance)    history of  . History of erectile dysfunction   . Hyperlipidemia   . HYPERLIPIDEMIA 05/01/2008  . Hypertension   . Impaired glucose tolerance 07/13/2011  . 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    Patient  Active Problem List   Diagnosis Date Noted  . Essential hypertension 03/09/2016  . Impaired glucose tolerance 07/13/2011  . Encounter for physical examination related to employment 07/13/2011  . S/P CABG (coronary artery bypass graft) 07/13/2011  . Coronary artery disease involving native heart without angina pectoris 06/01/2011  . Microscopic hematuria 07/17/2010  . Hyperlipidemia with target LDL less than 70 05/01/2008  . ERECTILE DYSFUNCTION 05/01/2008    Past Surgical History:  Procedure Laterality Date  . CORONARY ARTERY BYPASS GRAFT  05/04/2011   Procedure: CORONARY ARTERY BYPASS GRAFTING (CABG);  Surgeon: Melrose Nakayama, MD;  Location: [place] [facility];  Service: Open Heart Surgery;  Laterality: N/A;  CABG x  six;  using bilateral  internal mammary arteries and left leg greater saphenous vein harvested endoscopically --LIMA-LAD, RIMA-dRCA, SeqSVG-OM3-OM4  . LEFT HEART CATHETERIZATION WITH CORONARY ANGIOGRAM N/A 04/30/2011   Procedure: LEFT HEART CATHETERIZATION WITH CORONARY ANGIOGRAM;  Surgeon: Laverda Page, MD;  Location: [facility] CATH LAB: EF 55%; dominant right with diffuse 80% mid vessel disease. Just prior to bifurcation is a 90% stenosis.  D1 90% & LAD 70% after D1 - FFR positive. Large D2 noted.  Proximal circumflex 90% at OM1, ost OM1 80%. Subtotal OM 2 w/ TIMI 1 flow (large w/ several branches), Ost RI 80%  . NM MYOVIEW LTD  11/2012   Stress EKG showed 1.5 mm ST depression at peak exercise recovering less than 2 minutes into recovery. He exercised for 11:52 min. 12.9 METS.  Small-moderate-sized inferior-inferolateral transmural scar without infarction. Mild inferior wall hypokinesis. EF 61%. LOW RISK  . NM MYOVIEW LTD  08/26/2016   Exercise 11:41 min 12.7 MET S -peak heart rate 162 BPM (90% of max predicted). Normal blood pressure response = excellent exercise capacity..  EF 50%. Medium size, moderate intensity perfusion defect in the basal inferior-basal lateral and inferolateral wall  consistent with prior infarction. No evidence of reversibility/ischemia. (Similar to previous study)  . TRANSTHORACIC ECHOCARDIOGRAM  04/2001   EF 55-60%. Mild posterior-inferior hypokinesis. GR 1 DD.        Home Medications    Prior to Admission medications   Medication Sig Start Date End Date Taking? Authorizing Provider  aspirin 81 MG tablet Take 81 mg by mouth daily.   Yes [provider]  carvedilol (COREG) 3.125 MG tablet TAKE ONE TABLET BY MOUTH TWICE A DAY 08/09/17  Yes Leonie Man, MD  ezetimibe (ZETIA) 10 MG tablet Take 1 tablet (10 mg total) by mouth daily. 08/19/17 11/17/17 Yes Leonie Man, MD  losartan (COZAAR) 50 MG tablet Take 1 tablet (50 mg total) daily by mouth. 03/30/17  Yes Leonie Man, MD  omeprazole (PRILOSEC) 40 MG capsule Take 1 capsule (40 mg total) by mouth daily. 01/08/17  Yes Leonie Man, MD  Coenzyme Q10 (COQ10) 100 MG CAPS Take 300 mg by mouth daily. 07/19/17   Leonie Man, MD  rosuvastatin (CRESTOR) 40 MG tablet Take 20 MG ( 1/2 TABLET  OF 40 MG )DAILY 08/19/17   Leonie Man, MD    Family History Family History  Problem Relation Age of Onset  . Heart attack Brother     Social History Social History   Tobacco Use  . Smoking status: Former Smoker    Types: Cigarettes    Last attempt to quit: 05/10/2011    Years since quitting: 6.3  . Smokeless tobacco: Never Used  . Tobacco comment: social smoker only prior to MI  Substance Use Topics  . Alcohol use: Yes    Comment: occa  . Drug use: No     Allergies   Atorvastatin and Simvastatin   Review of Systems Review of Systems  Constitutional: Negative for fever.  HENT:       Throat burning  Respiratory: Negative for chest tightness.   Cardiovascular: Negative for chest pain.  Gastrointestinal: Positive for diarrhea and nausea. Negative for abdominal distention, abdominal pain, constipation and vomiting.  Genitourinary: Negative for difficulty urinating and  dysuria.     Physical Exam Updated Vital Signs BP 121/83   Pulse 63   Temp 98.2 F (36.8 C) (Oral)   Resp 14   Ht '5\' 7"'  (1.702 m)   Wt 89.4 kg (197 lb)   SpO2 97%   BMI 30.85 kg/m   Physical Exam  Constitutional: He is oriented to person, place, and time. He appears well-developed and well-nourished. No distress.  HENT:  Head: Normocephalic and atraumatic.  Nose: Nose normal.  Mouth/Throat: Oropharynx is clear and moist. No oropharyngeal exudate.  Eyes: Pupils are equal, round, and reactive to light. Conjunctivae and EOM are normal.  Neck: Normal range of motion. Neck supple.  Cardiovascular: Normal rate, regular rhythm and normal heart sounds.  No murmur heard. Pulmonary/Chest: Effort normal and breath sounds normal. No respiratory distress. He has no wheezes. He exhibits no tenderness.  Abdominal: Soft. Bowel sounds are normal. He exhibits no distension and no mass. There is no tenderness. There is no guarding.  Musculoskeletal:  Normal range of motion. He exhibits no edema or tenderness.  Lymphadenopathy:    He has no cervical adenopathy.  Neurological: He is alert and oriented to person, place, and time.  Skin: Skin is warm and dry.  Psychiatric: He has a normal mood and affect. His behavior is normal.  Nursing note and vitals reviewed.  ED Treatments / Results  Labs (all labs ordered are listed, but only abnormal results are displayed) Labs Reviewed - No data to display  EKG None  Radiology No results found.  Procedures Procedures (including critical care time)  Medications Ordered in ED Medications  gi cocktail (Maalox,Lidocaine,Donnatal) (30 mLs Oral Given 08/27/17 1833)     Initial Impression / Assessment and Plan / ED Course  I have reviewed the triage vital signs and the nursing notes.  Pertinent labs & imaging results that were available during my care of the patient were reviewed by me and considered in my medical decision making (see chart for  details).     6294 Patient presents with throat burning and diarrhea. History of GERD and reports throat burning consistent with typical GERD sx though more severe and no improvement with omeprazole. Physical exam benign. Differential includes acute worsening of typical GERD sx after abnormal meal yesterday. Given accompanying diarrhea, could also have GI viral illness that is exacerbating sx. Less likely cardiac etiology given location and nature of pain, however does have history of CAD and MI so will get screening EKG. Will also give GI cocktail then reassess.   1933 After reassessment, patient feels significant improved. EKG WNL. Stable for discharge home. Will give info about DASH and GERD diet   Final Clinical Impressions(s) / ED Diagnoses   Final diagnoses:  Burning sensation of throat    ED Discharge Orders    None     Adin Hector, MD, MPH PGY-3 [facility] Pager [phone]    Verner Mould, MD 08/27/17 Viann Fish, MD 08/28/17 [phone]

## 2017-08-27 NOTE — Discharge Instructions (Signed)
You can continue to take omeprazole daily to help with GERD (acid reflux or heartburn). Try to follow the food choices in the handout to prevent GERD symptoms.   If your symptoms worsen, or if you start having shortness of breath or chest pain, please return to the emergency room.

## 2017-08-27 NOTE — ED Notes (Signed)
ED Provider at bedside. 

## 2017-08-27 NOTE — ED Triage Notes (Signed)
Pt c/o throat burning and GERD symptoms with diarrhea since last night that was unrelieved with 2 omeprazole

## 2017-08-27 NOTE — ED Notes (Signed)
Pt verbalizes understanding of d/c instructions and denies any further need at this time. 

## 2017-09-09 ENCOUNTER — Other Ambulatory Visit: Payer: Self-pay | Admitting: Cardiology

## 2017-09-10 ENCOUNTER — Telehealth: Payer: Self-pay | Admitting: Cardiology

## 2017-09-10 ENCOUNTER — Other Ambulatory Visit: Payer: Self-pay

## 2017-09-10 NOTE — Telephone Encounter (Signed)
New Message:      Pt c/o medication issue:  1. Name of Medication:Coenzyme Q10 (COQ10) 100 MG CAPS  ezetimibe (ZETIA) 10 MG tablet   2. How are you currently taking this medication (dosage and times per day)?  Take 300 mg by mouth daily.(Q10) Take 1 tablet (10 mg total) by mouth daily. 3. Are you having a reaction (difficulty breathing--STAT)? No  4. What is your medication issue? Pt is wanting to know if he still needs to be taken the 300 mg of the Q10 since he has started the Zetia

## 2017-09-10 NOTE — Telephone Encounter (Signed)
LEFT MESSAGE - WILL DEFER TO DR HARDING . AND CONTACT PATIENT.

## 2017-09-10 NOTE — Telephone Encounter (Addendum)
New Message:      *STAT* If patient is at the pharmacy, call can be transferred to refill team.   1. Which medications need to be refilled? (please list name of each medication and dose if known) losartan (COZAAR) 50 MG tablet  2. Which pharmacy/location (including street and city if local pharmacy) is medication to be sent to?[facility] [address] - [place], [place] - [id number]  3. Do they need a 30 day or 90 day supply? 90

## 2017-09-11 NOTE — Telephone Encounter (Signed)
Yes DH 

## 2017-09-13 NOTE — Telephone Encounter (Signed)
Spoke to patient . Aware to take both medications.

## 2017-10-05 ENCOUNTER — Ambulatory Visit: Payer: Managed Care, Other (non HMO)

## 2017-10-07 ENCOUNTER — Ambulatory Visit: Payer: Managed Care, Other (non HMO)

## 2017-10-25 ENCOUNTER — Ambulatory Visit: Payer: Managed Care, Other (non HMO)

## 2017-10-28 ENCOUNTER — Ambulatory Visit (INDEPENDENT_AMBULATORY_CARE_PROVIDER_SITE_OTHER): Payer: Managed Care, Other (non HMO) | Admitting: Pharmacist Clinician (PhC)/ Clinical Pharmacy Specialist

## 2017-10-28 ENCOUNTER — Encounter: Payer: Self-pay | Admitting: Pharmacist Clinician (PhC)/ Clinical Pharmacy Specialist

## 2017-10-28 DIAGNOSIS — E785 Hyperlipidemia, unspecified: Secondary | ICD-10-CM | POA: Diagnosis not present

## 2017-10-28 NOTE — Patient Instructions (Signed)
Continue with the Repatha injections every 14 days - marked in your calendar.  Take only 1/4 of the rosuvastatin 40 mg tablet every Monday, Wednesday and Friday.    Continue with the ezetimibe 10 mg until they are gone.  You do not need to refill this.    Call if you develop any muscle aches or problems from the rosuvastatin

## 2017-10-28 NOTE — Progress Notes (Signed)
10/28/2017 Samuel Hawkins 09/22/1955 [id number]   HPI:  Samuel Proctorshraf Selkirk is a 62 y.o. male patient of Dr Herbie BaltimoreHarding, who presents today for a lipid clinic evaluation.  His cardiovascular history is significant for multivessel CAD with CABG x 4, hyperlipidemia, hypertension and prior tobacco abuse.  He is a Naval architecttruck driver for [employer] and often works until around 2 or 3 in the morning.  He then goes home and takes his "night time" medications.  He has tried multiple statin drugs, but has developed problems with myalgias over long term use.   He notes that he can usually take for 2-3 months before the symptoms develop, then will continue to have problems even after week-long wash out periods.   He has not taken rosuvastatin for more than a month now.    Today he comes in to discuss options, as the rosuvastatin 20 mg daily was causing myalgias.   He currently is on only ezetimibe 10 mg.  There is some frustration from him that we keep trying medications and he has multiple bottles of pills he cannot take.  Reviewed the options available to him, explaining that using a PCSK-9 inhibitor would be our best bet to get the LDL < 70.  He is a little hesitant about doing injections and not sure how he'll remember to do something only once every other week.     Current Medications:  Ezetimibe 10 mg  Cholesterol Goals:   LDL < 70  Intolerant/previously tried:  Atorvastatin (dose unknown - noted in the chart after CABG surgery in 2012 )  Rosuvastatin 40 mg and 20 mg daily  Ezetimibe/simvastatin 10/20 mg daily  Family history:   Father 14(82) old age and general decline  Mother (7075) same  Brother died from MI at 5241  3 other siblings, 1 with MI/stroke but doing well now  1 daughter, works in [employer] field - he does not know if she has any CVD  Diet:    Eats mostly at home, does add some salt with cooking, but not at the table, admits to occasional fried food; former smoker (social, not even daily), does drink  occasional alcohol.   Exercise:    Occasional work outs, but not with regularity, no yard work, drives truck so some activity with unloading  Labs:   07/2017:  TC 134, TG 94, HDL 31, LDL 84 (on rosuvastatin 40 mg)  Current Outpatient Medications  Medication Sig Dispense Refill  . aspirin 81 MG tablet Take 81 mg by mouth daily.    . carvedilol (COREG) 3.125 MG tablet TAKE ONE TABLET BY MOUTH TWICE A DAY 180 tablet 3  . Coenzyme Q10 (COQ10) 100 MG CAPS Take 300 mg by mouth daily. 90 each 3  . ezetimibe (ZETIA) 10 MG tablet Take 1 tablet (10 mg total) by mouth daily. 30 tablet 6  . losartan (COZAAR) 50 MG tablet TAKE ONE TABLET BY MOUTH DAILY 90 tablet 2  . omeprazole (PRILOSEC) 40 MG capsule Take 1 capsule (40 mg total) by mouth daily. 90 capsule 1  . rosuvastatin (CRESTOR) 40 MG tablet Take 20 MG ( 1/2 TABLET  OF 40 MG )DAILY 90 tablet 3   No current facility-administered medications for this visit.     Allergies  Allergen Reactions  . Atorvastatin Other (See Comments)    REACTION: muscle cramp  . Simvastatin Other (See Comments)    REACTION: muscle cramp    Past Medical History:  Diagnosis Date  . CAD (coronary artery disease)  04/2012   Dr. Jacinto Halim: EF 55%; dominant right with diffuse 80% mid vessel disease. Just prior to bifurcation is a 90% stenosis.  D1 90% & LAD 70% after D1 - FFR positive. Large D2 noted.  Proximal circumflex 90% at OM1, ostial OM1 80%. Subtotal OM 2 with TIMI 1 flow (large vessel with several branches).  Ostial ramus 80%.  . ERECTILE DYSFUNCTION 05/01/2008  . Glucose intolerance (impaired glucose tolerance)    history of  . History of erectile dysfunction   . Hyperlipidemia   . HYPERLIPIDEMIA 05/01/2008  . Hypertension   . Impaired glucose tolerance 07/13/2011  . 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    There were no vitals taken for this visit.   Hyperlipidemia with target LDL less than 70 Patient  with LDL not to goal and unable to tolerate high dose statins for any significant length of time.  Will start him on Repatha 140 mg every 2 weeks and also rechallenge him with the rosuvastatin at 10 mg just three times per week (MWF).  He was given written instructions on this and a first dose of Repatha was given in the office, as patient had some anxieties/concerns about doing an injectable medication.  We also programmed his cell phone to remind him to do the injection every 2 weeks.  I also encouraged that he get a 7 day pill minder, to help with the three times weekly rosuvastatin.  He is aware that if muscle aches develop he needs to call the office and was given the [facility] phone number.     Phillips Hay PharmD CPP [facility] [address] [place], [place] [address]

## 2017-10-28 NOTE — Assessment & Plan Note (Signed)
Patient with LDL not to goal and unable to tolerate high dose statins for any significant length of time.  Will start him on Repatha 140 mg every 2 weeks and also rechallenge him with the rosuvastatin at 10 mg just three times per week (MWF).  He was given written instructions on this and a first dose of Repatha was given in the office, as patient had some anxieties/concerns about doing an injectable medication.  We also programmed his cell phone to remind him to do the injection every 2 weeks.  I also encouraged that he get a 7 day pill minder, to help with the three times weekly rosuvastatin.  He is aware that if muscle aches develop he needs to call the office and was given the CV[facility] phone number.

## 2017-11-04 ENCOUNTER — Telehealth: Payer: Self-pay | Admitting: Pharmacist Clinician (PhC)/ Clinical Pharmacy Specialist

## 2017-11-04 MED ORDER — EVOLOCUMAB 140 MG/ML ~~LOC~~ SOAJ: 140.0000 mg | SUBCUTANEOUS | 12 refills | Status: DC

## 2017-11-04 NOTE — Telephone Encounter (Signed)
repatha approved to 05/04/18.  [facility]

## 2017-11-20 ENCOUNTER — Other Ambulatory Visit: Payer: Self-pay | Admitting: Cardiology

## 2017-11-22 NOTE — Telephone Encounter (Signed)
REFILLED WITH 3 ADDITIONAL TIMES

## 2018-01-03 ENCOUNTER — Telehealth: Payer: Self-pay | Admitting: Cardiology

## 2018-01-03 NOTE — Telephone Encounter (Signed)
Forward to [facility]

## 2018-01-03 NOTE — Telephone Encounter (Signed)
New message   Pt c/o medication issue:  1. Name of Medication: ezetimibe (ZETIA) 10 MG tablet(Expired)  2. How are you currently taking this medication (dosage and times per day)? 1 time daily at night   3. Are you having a reaction (difficulty breathing--STAT)? No   4. What is your medication issue?patient needs to know if he needs to take Zetia and if so will it interact with the repatha shot that he is taking?

## 2018-01-03 NOTE — Telephone Encounter (Signed)
Yes, he should continue with ezetimibe (Zetia)

## 2018-01-03 NOTE — Telephone Encounter (Signed)
SPOKE TO PATIENT. AWARE TO CONTINUE TAKING ZETIA , REPATHA AND 1/4 TABLET OF ROSUVASTATIN ON M-W-F.  PATIENT ASKED IF HOW LONG WILL HE BE TAKING THE ABOVE MEDICATION. RN INFORMED PATIENT IT WILL BE INDEFINITELY. PATIENT VERBALIZED UNDERSTANDING.

## 2018-01-04 ENCOUNTER — Other Ambulatory Visit: Payer: Self-pay | Admitting: Cardiology

## 2018-03-13 ENCOUNTER — Other Ambulatory Visit: Payer: Self-pay | Admitting: Pharmacist Clinician (PhC)/ Clinical Pharmacy Specialist

## 2018-03-13 ENCOUNTER — Telehealth: Payer: Self-pay | Admitting: Pharmacist Clinician (PhC)/ Clinical Pharmacy Specialist

## 2018-03-13 DIAGNOSIS — E785 Hyperlipidemia, unspecified: Secondary | ICD-10-CM

## 2018-03-13 NOTE — Telephone Encounter (Signed)
Labs ordered for patient to check LDL.  PA expires in Dec 2019

## 2018-03-25 ENCOUNTER — Other Ambulatory Visit: Payer: Self-pay

## 2018-03-25 DIAGNOSIS — Z79899 Other long term (current) drug therapy: Secondary | ICD-10-CM

## 2018-03-25 DIAGNOSIS — E785 Hyperlipidemia, unspecified: Secondary | ICD-10-CM

## 2018-03-26 LAB — LIPID PANEL
CHOL/HDL RATIO: 2 ratio (ref 0.0–5.0)
Cholesterol, Total: 59 mg/dL — ABNORMAL LOW (ref 100–199)
HDL: 30 mg/dL — ABNORMAL LOW (ref >39)
LDL CALC: 19 mg/dL (ref 0–99)
TRIGLYCERIDES: 52 mg/dL (ref 0–149)
VLDL Cholesterol Cal: 10 mg/dL (ref 5–40)

## 2018-04-03 ENCOUNTER — Other Ambulatory Visit: Payer: Self-pay | Admitting: Cardiology

## 2018-05-13 ENCOUNTER — Telehealth: Payer: Self-pay | Admitting: Cardiology

## 2018-05-13 NOTE — Telephone Encounter (Signed)
New Message: ° ° ° ° ° ° °Pt is calling for lab results. °

## 2018-05-13 NOTE — Telephone Encounter (Signed)
Patient made aware and verbalized understanding.

## 2018-05-13 NOTE — Telephone Encounter (Signed)
I see lipid profile from 03/25/2018.  Is he looking for anything more recent?   LDL is down to 19mg /dL and total cholesterol is 59mg /dL. Not checked by Dr Herbie BaltimoreHarding yet.

## 2018-07-13 ENCOUNTER — Other Ambulatory Visit: Payer: Self-pay | Admitting: Cardiology

## 2018-07-18 ENCOUNTER — Encounter: Payer: Self-pay | Admitting: Cardiology

## 2018-07-18 ENCOUNTER — Ambulatory Visit: Payer: Managed Care, Other (non HMO) | Admitting: Cardiology

## 2018-07-18 VITALS — BP 138/76 | HR 58 | Ht 67.0 in | Wt 210.0 lb

## 2018-07-18 DIAGNOSIS — R0609 Other forms of dyspnea: Secondary | ICD-10-CM

## 2018-07-18 DIAGNOSIS — E785 Hyperlipidemia, unspecified: Secondary | ICD-10-CM

## 2018-07-18 DIAGNOSIS — Z951 Presence of aortocoronary bypass graft: Secondary | ICD-10-CM | POA: Diagnosis not present

## 2018-07-18 DIAGNOSIS — I25118 Atherosclerotic heart disease of native coronary artery with other forms of angina pectoris: Secondary | ICD-10-CM | POA: Diagnosis not present

## 2018-07-18 DIAGNOSIS — R931 Abnormal findings on diagnostic imaging of heart and coronary circulation: Secondary | ICD-10-CM

## 2018-07-18 DIAGNOSIS — I10 Essential (primary) hypertension: Secondary | ICD-10-CM

## 2018-07-18 NOTE — H&P (View-Only) (Signed)
Subjective:  Primary Physician:  Samuel Borg, MD  Patient ID: Samuel Hawkins, male    DOB: [birth year], 63 y.o.   MRN: [id number]  Chief Complaint  Patient presents with  . Coronary Artery Disease  . Hypertension  . Shortness of Breath    HPI: Samuel Hawkins  is a 63 y.o. male  with myocardial infarction on 04/28/2011 and had triple-vessel coronary artery disease and underwent CABG.  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 He has not had any further episodes of syncope. He has never had chest pain during his myocardial infarction.  He was last seen by me in 2017, comes to reestablish care.  States that over the past few months he has noticed worsening dyspnea on exertion.  He is a Administrator by profession.  He is concerned about new onset dyspnea.  No associated PND or orthopnea, no chest pain.  Past Medical History:  Diagnosis Date  . CAD (coronary artery disease) 04/2012   Dr. Einar Gip: EF 55%; dominant right with diffuse 80% mid vessel disease. Just prior to bifurcation is a 90% stenosis.  D1 90% & LAD 70% after D1 - FFR positive. Large D2 noted.  Proximal circumflex 90% at OM1, ostial OM1 80%. Subtotal OM 2 with TIMI 1 flow (large vessel with several branches).  Ostial ramus 80%.  . ERECTILE DYSFUNCTION 05/01/2008  . Glucose intolerance (impaired glucose tolerance)    history of  . History of erectile dysfunction   . Hyperlipidemia   . HYPERLIPIDEMIA 05/01/2008  . Hypertension   . Impaired glucose tolerance 07/13/2011  . 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    Past Surgical History:  Procedure Laterality Date  . CORONARY ARTERY BYPASS GRAFT  05/04/2011   Procedure: CORONARY ARTERY BYPASS GRAFTING (CABG);  Surgeon: Melrose Nakayama, MD;  Location: [place];  Service: Open Heart Surgery;  Laterality: N/A;  CABG x  six;  using bilateral  internal mammary  arteries and left leg greater saphenous vein harvested endoscopically --LIMA-LAD, RIMA-dRCA, SeqSVG-OM3-OM4  . LEFT HEART CATHETERIZATION WITH CORONARY ANGIOGRAM N/A 04/30/2011   Procedure: LEFT HEART CATHETERIZATION WITH CORONARY ANGIOGRAM;  Surgeon: Laverda Page, MD;  Location: [facility] CATH LAB: EF 55%; dominant right with diffuse 80% mid vessel disease. Just prior to bifurcation is a 90% stenosis.  D1 90% & LAD 70% after D1 - FFR positive. Large D2 noted.  Proximal circumflex 90% at OM1, ost OM1 80%. Subtotal OM 2 w/ TIMI 1 flow (large w/ several branches), Ost RI 80%  . NM MYOVIEW LTD  11/2012   Stress EKG showed 1.5 mm ST depression at peak exercise recovering less than 2 minutes into recovery. He exercised for 11:52 min. 12.9 METS. Small-moderate-sized inferior-inferolateral transmural scar without infarction. Mild inferior wall hypokinesis. EF 61%. LOW RISK  . NM MYOVIEW LTD  08/26/2016   Exercise 11:41 min 12.7 MET S -peak heart rate 162 BPM (90% of max predicted). Normal blood pressure response = excellent exercise capacity..  EF 50%. Medium size, moderate intensity perfusion defect in the basal inferior-basal lateral and inferolateral wall consistent with prior infarction. No evidence of reversibility/ischemia. (Similar to previous study)  . TRANSTHORACIC ECHOCARDIOGRAM  04/2001   EF 55-60%. Mild posterior-inferior hypokinesis. GR 1 DD.    Social History   Socioeconomic History  . Marital status: Divorced    Spouse name: Not on file  . Number of  children: 1  . Years of education: Not on file  . Highest education level: Not on file  Occupational History  . Occupation: Ambulance person: [employer]  . Financial resource strain: Not on file  . Food insecurity:    Worry: Not on file    Inability: Not on file  . Transportation needs:    Medical: Not on file    Non-medical: Not on file  Tobacco Use  . Smoking status: Former Smoker    Types: Cigarettes    Last  attempt to quit: 05/10/2011    Years since quitting: 7.1  . Smokeless tobacco: Never Used  . Tobacco comment: social smoker only prior to MI  Substance and Sexual Activity  . Alcohol use: Not Currently  . Drug use: No  . Sexual activity: Never  Lifestyle  . Physical activity:    Days per week: Not on file    Minutes per session: Not on file  . Stress: Not on file  Relationships  . Social connections:    Talks on phone: Not on file    Gets together: Not on file    Attends religious service: Not on file    Active member of club or organization: Not on file    Attends meetings of clubs or organizations: Not on file    Relationship status: Not on file  . Intimate partner violence:    Fear of current or ex partner: Not on file    Emotionally abused: Not on file    Physically abused: Not on file    Forced sexual activity: Not on file  Other Topics Concern  . Not on file  Social History Narrative  . Not on file    Current Outpatient Medications on File Prior to Visit  Medication Sig Dispense Refill  . aspirin 81 MG tablet Take 81 mg by mouth daily.    . carvedilol (COREG) 3.125 MG tablet TAKE ONE TABLET BY MOUTH TWICE A DAY 180 tablet 3  . Evolocumab (REPATHA SURECLICK) 539 MG/ML SOAJ Inject 140 mg into the skin every 14 (fourteen) days. 2 pen 12  . ezetimibe (ZETIA) 10 MG tablet TAKE ONE TABLET BY MOUTH DAILY 90 tablet 2  . losartan (COZAAR) 50 MG tablet TAKE ONE TABLET BY MOUTH DAILY 90 tablet 3  . omeprazole (PRILOSEC) 40 MG capsule TAKE ONE CAPSULE BY MOUTH DAILY 90 capsule 0  . Coenzyme Q10 (COQ10) 100 MG CAPS Take 300 mg by mouth daily. (Patient not taking: Reported on 07/18/2018) 90 each 3  . rosuvastatin (CRESTOR) 40 MG tablet Take 20 MG ( 1/2 TABLET  OF 40 MG )DAILY (Patient not taking: Reported on 07/18/2018) 90 tablet 3   No current facility-administered medications on file prior to visit.      Review of Systems  Constitutional: Negative for malaise/fatigue and weight  loss.  Respiratory: Positive for shortness of breath. Negative for cough and hemoptysis.   Cardiovascular: Negative for chest pain, palpitations, claudication and leg swelling.  Gastrointestinal: Negative for abdominal pain, blood in stool, constipation, heartburn and vomiting.  Genitourinary: Negative for dysuria.  Musculoskeletal: Negative for joint pain and myalgias.  Neurological: Negative for dizziness, focal weakness and headaches.  Endo/Heme/Allergies: Does not bruise/bleed easily.  Psychiatric/Behavioral: Negative for depression. The patient is not nervous/anxious.   All other systems reviewed and are negative.      Objective:  Blood pressure 138/76, pulse (!) 58, height '5\' 7"'$  (1.702 m), weight 210 lb (95.3  kg), SpO2 98 %. Body mass index is 32.89 kg/m.  Physical Exam  Constitutional: He appears well-developed and well-nourished. No distress.  HENT:  Head: Atraumatic.  Eyes: Conjunctivae are normal.  Neck: Neck supple. No JVD present. No thyromegaly present.  Cardiovascular: Normal rate, regular rhythm, normal heart sounds and intact distal pulses. Exam reveals no gallop.  No murmur heard. Pulmonary/Chest: Effort normal and breath sounds normal.  Abdominal: Soft. Bowel sounds are normal.  Musculoskeletal: Normal range of motion.        General: No edema.  Neurological: He is alert.  Skin: Skin is warm and dry.  Psychiatric: He has a normal mood and affect.    CARDIAC STUDIES:   Exercise Sestamibi Stress Test 08/26/2016:  Exercise 11:41 min 12.7 MET S -peak heart rate 162 BPM (90% of max predicted). Normal blood pressure response = excellent exercise capacity..  EF 50%. Medium size, moderate intensity perfusion defect in the basal inferior-basal lateral and inferolateral wall consistent with prior infarction. No evidence of reversibility/ischemia. (Similar to previous study)   Echocardiogram 04/29/2011 :  Left ventricle: The cavity size was normal. Wall thickness was  normal. Systolic function was normal. The estimated ejection fraction was in the range of 55% to 60%. There ismild hypokinesis of the posterior wall. Doppler parameters are consistent with abnormal left ventricular relaxation (grade 1 diastolic dysfunction).   Assessment & Recommendations:   1. Coronary artery disease involving native coronary artery of native heart with other form of angina pectoris ([place])  Heart cath 04/30/11. Mid LAD 80%, Prox circumflex at OM1 90%, OM2 99%. Ramus 80%, RCA mid, dist 90%.  CABG 05/04/11 x 6 vessels 05/04/11 by Dr. Roxan Hockey. coronary LIMA to LAD, RIMA to distal RCA, sequential SVG to ramus intermedius and OM 1, SVG to OM 3 and 4.  EKG 07/17/21: Sinus bradycardia at the rate of 56 bpm, normal axis.  No evidence of ischemia, normal EKG. - EKG 12-Lead  2. S/P CABG (coronary artery bypass graft)  3. Dyspnea on exertion  4. Abnormal nuclear cardiac imaging test  5. Essential hypertension  6. Hyperlipidemia with target LDL less than 70  Recommendation:  Patient presents here to establish cardiac care, I'd seen him almost 45 years ago, he has now developed worsening dyspnea on exertion, he is never had classic angina pectoris even when he had non-transmittal myocardial infarction that needed CABG.  He is a very strong family history of premature coronary artery disease and is presently on Repatha for hyper-lipidemia along with high-dose Crestor and Zetia. Lipids under excellent control.   In view of new onset dyspnea which is his anginal equivalent, previously performed multiple nuclear stress is have revealed inferior wall ischemia, I suspect he probably should proceed with cardiac catheterization directly. He had severe disease pre-CABG. Multiple grafts present and graft failure is a probability.   His blood pressure is well controlled, otherwise his risk factors are well controlled.  He is not a smoker of tobacco.  Schedule for cardiac catheterization,  and possible angioplasty. We discussed regarding risks, benefits, alternatives to this including stress testing, CTA and continued medical therapy. Patient wants to proceed. Understands <1-2% risk of death, stroke, MI, urgent CABG, bleeding, infection, renal failure but not limited to these.  Office visit following the work-up/investigations.   Adrian Prows, MD, [facility] 07/18/2018, 4:23 PM [facility]. [address] Pager: [phone] Office: [phone] If no answer Cell [phone]

## 2018-07-18 NOTE — Progress Notes (Signed)
Subjective:  Primary Physician:  Biagio Borg, MD  Patient ID: Samuel Hawkins, male    DOB: [birth year], 63 y.o.   MRN: [id number]  Chief Complaint  Patient presents with  . Coronary Artery Disease  . Hypertension  . Shortness of Breath    HPI: Samuel Hawkins  is a 63 y.o. male  with myocardial infarction on 04/28/2011 and had triple-vessel coronary artery disease and underwent CABG.  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 He has not had any further episodes of syncope. He has never had chest pain during his myocardial infarction.  He was last seen by me in 2017, comes to reestablish care.  States that over the past few months he has noticed worsening dyspnea on exertion.  He is a Administrator by profession.  He is concerned about new onset dyspnea.  No associated PND or orthopnea, no chest pain.  Past Medical History:  Diagnosis Date  . CAD (coronary artery disease) 04/2012   Dr. Einar Gip: EF 55%; dominant right with diffuse 80% mid vessel disease. Just prior to bifurcation is a 90% stenosis.  D1 90% & LAD 70% after D1 - FFR positive. Large D2 noted.  Proximal circumflex 90% at OM1, ostial OM1 80%. Subtotal OM 2 with TIMI 1 flow (large vessel with several branches).  Ostial ramus 80%.  . ERECTILE DYSFUNCTION 05/01/2008  . Glucose intolerance (impaired glucose tolerance)    history of  . History of erectile dysfunction   . Hyperlipidemia   . HYPERLIPIDEMIA 05/01/2008  . Hypertension   . Impaired glucose tolerance 07/13/2011  . 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    Past Surgical History:  Procedure Laterality Date  . CORONARY ARTERY BYPASS GRAFT  05/04/2011   Procedure: CORONARY ARTERY BYPASS GRAFTING (CABG);  Surgeon: Melrose Nakayama, MD;  Location: [place];  Service: Open Heart Surgery;  Laterality: N/A;  CABG x  six;  using bilateral  internal mammary  arteries and left leg greater saphenous vein harvested endoscopically --LIMA-LAD, RIMA-dRCA, SeqSVG-OM3-OM4  . LEFT HEART CATHETERIZATION WITH CORONARY ANGIOGRAM N/A 04/30/2011   Procedure: LEFT HEART CATHETERIZATION WITH CORONARY ANGIOGRAM;  Surgeon: Laverda Page, MD;  Location: [facility] CATH LAB: EF 55%; dominant right with diffuse 80% mid vessel disease. Just prior to bifurcation is a 90% stenosis.  D1 90% & LAD 70% after D1 - FFR positive. Large D2 noted.  Proximal circumflex 90% at OM1, ost OM1 80%. Subtotal OM 2 w/ TIMI 1 flow (large w/ several branches), Ost RI 80%  . NM MYOVIEW LTD  11/2012   Stress EKG showed 1.5 mm ST depression at peak exercise recovering less than 2 minutes into recovery. He exercised for 11:52 min. 12.9 METS. Small-moderate-sized inferior-inferolateral transmural scar without infarction. Mild inferior wall hypokinesis. EF 61%. LOW RISK  . NM MYOVIEW LTD  08/26/2016   Exercise 11:41 min 12.7 MET S -peak heart rate 162 BPM (90% of max predicted). Normal blood pressure response = excellent exercise capacity..  EF 50%. Medium size, moderate intensity perfusion defect in the basal inferior-basal lateral and inferolateral wall consistent with prior infarction. No evidence of reversibility/ischemia. (Similar to previous study)  . TRANSTHORACIC ECHOCARDIOGRAM  04/2001   EF 55-60%. Mild posterior-inferior hypokinesis. GR 1 DD.    Social History   Socioeconomic History  . Marital status: Divorced    Spouse name: Not on file  . Number of  children: 1  . Years of education: Not on file  . Highest education level: Not on file  Occupational History  . Occupation: Ambulance person: [employer]  . Financial resource strain: Not on file  . Food insecurity:    Worry: Not on file    Inability: Not on file  . Transportation needs:    Medical: Not on file    Non-medical: Not on file  Tobacco Use  . Smoking status: Former Smoker    Types: Cigarettes    Last  attempt to quit: 05/10/2011    Years since quitting: 7.1  . Smokeless tobacco: Never Used  . Tobacco comment: social smoker only prior to MI  Substance and Sexual Activity  . Alcohol use: Not Currently  . Drug use: No  . Sexual activity: Never  Lifestyle  . Physical activity:    Days per week: Not on file    Minutes per session: Not on file  . Stress: Not on file  Relationships  . Social connections:    Talks on phone: Not on file    Gets together: Not on file    Attends religious service: Not on file    Active member of club or organization: Not on file    Attends meetings of clubs or organizations: Not on file    Relationship status: Not on file  . Intimate partner violence:    Fear of current or ex partner: Not on file    Emotionally abused: Not on file    Physically abused: Not on file    Forced sexual activity: Not on file  Other Topics Concern  . Not on file  Social History Narrative  . Not on file    Current Outpatient Medications on File Prior to Visit  Medication Sig Dispense Refill  . aspirin 81 MG tablet Take 81 mg by mouth daily.    . carvedilol (COREG) 3.125 MG tablet TAKE ONE TABLET BY MOUTH TWICE A DAY 180 tablet 3  . Evolocumab (REPATHA SURECLICK) 938 MG/ML SOAJ Inject 140 mg into the skin every 14 (fourteen) days. 2 pen 12  . ezetimibe (ZETIA) 10 MG tablet TAKE ONE TABLET BY MOUTH DAILY 90 tablet 2  . losartan (COZAAR) 50 MG tablet TAKE ONE TABLET BY MOUTH DAILY 90 tablet 3  . omeprazole (PRILOSEC) 40 MG capsule TAKE ONE CAPSULE BY MOUTH DAILY 90 capsule 0  . Coenzyme Q10 (COQ10) 100 MG CAPS Take 300 mg by mouth daily. (Patient not taking: Reported on 07/18/2018) 90 each 3  . rosuvastatin (CRESTOR) 40 MG tablet Take 20 MG ( 1/2 TABLET  OF 40 MG )DAILY (Patient not taking: Reported on 07/18/2018) 90 tablet 3   No current facility-administered medications on file prior to visit.      Review of Systems  Constitutional: Negative for malaise/fatigue and weight  loss.  Respiratory: Positive for shortness of breath. Negative for cough and hemoptysis.   Cardiovascular: Negative for chest pain, palpitations, claudication and leg swelling.  Gastrointestinal: Negative for abdominal pain, blood in stool, constipation, heartburn and vomiting.  Genitourinary: Negative for dysuria.  Musculoskeletal: Negative for joint pain and myalgias.  Neurological: Negative for dizziness, focal weakness and headaches.  Endo/Heme/Allergies: Does not bruise/bleed easily.  Psychiatric/Behavioral: Negative for depression. The patient is not nervous/anxious.   All other systems reviewed and are negative.      Objective:  Blood pressure 138/76, pulse (!) 58, height '5\' 7"'$  (1.702 m), weight 210 lb (95.3  kg), SpO2 98 %. Body mass index is 32.89 kg/m.  Physical Exam  Constitutional: He appears well-developed and well-nourished. No distress.  HENT:  Head: Atraumatic.  Eyes: Conjunctivae are normal.  Neck: Neck supple. No JVD present. No thyromegaly present.  Cardiovascular: Normal rate, regular rhythm, normal heart sounds and intact distal pulses. Exam reveals no gallop.  No murmur heard. Pulmonary/Chest: Effort normal and breath sounds normal.  Abdominal: Soft. Bowel sounds are normal.  Musculoskeletal: Normal range of motion.        General: No edema.  Neurological: He is alert.  Skin: Skin is warm and dry.  Psychiatric: He has a normal mood and affect.    CARDIAC STUDIES:   Exercise Sestamibi Stress Test 08/26/2016:  Exercise 11:41 min 12.7 MET S -peak heart rate 162 BPM (90% of max predicted). Normal blood pressure response = excellent exercise capacity..  EF 50%. Medium size, moderate intensity perfusion defect in the basal inferior-basal lateral and inferolateral wall consistent with prior infarction. No evidence of reversibility/ischemia. (Similar to previous study)   Echocardiogram 04/29/2011 :  Left ventricle: The cavity size was normal. Wall thickness was  normal. Systolic function was normal. The estimated ejection fraction was in the range of 55% to 60%. There ismild hypokinesis of the posterior wall. Doppler parameters are consistent with abnormal left ventricular relaxation (grade 1 diastolic dysfunction).   Assessment & Recommendations:   1. Coronary artery disease involving native coronary artery of native heart with other form of angina pectoris ([place])  Heart cath 04/30/11. Mid LAD 80%, Prox circumflex at OM1 90%, OM2 99%. Ramus 80%, RCA mid, dist 90%.  CABG 05/04/11 x 6 vessels 05/04/11 by Dr. Roxan Hockey. coronary LIMA to LAD, RIMA to distal RCA, sequential SVG to ramus intermedius and OM 1, SVG to OM 3 and 4.  EKG 07/17/21: Sinus bradycardia at the rate of 56 bpm, normal axis.  No evidence of ischemia, normal EKG. - EKG 12-Lead  2. S/P CABG (coronary artery bypass graft)  3. Dyspnea on exertion  4. Abnormal nuclear cardiac imaging test  5. Essential hypertension  6. Hyperlipidemia with target LDL less than 70  Recommendation:  Patient presents here to establish cardiac care, I'd seen him almost 45 years ago, he has now developed worsening dyspnea on exertion, he is never had classic angina pectoris even when he had non-transmittal myocardial infarction that needed CABG.  He is a very strong family history of premature coronary artery disease and is presently on Repatha for hyper-lipidemia along with high-dose Crestor and Zetia. Lipids under excellent control.   In view of new onset dyspnea which is his anginal equivalent, previously performed multiple nuclear stress is have revealed inferior wall ischemia, I suspect he probably should proceed with cardiac catheterization directly. He had severe disease pre-CABG. Multiple grafts present and graft failure is a probability.   His blood pressure is well controlled, otherwise his risk factors are well controlled.  He is not a smoker of tobacco.  Schedule for cardiac catheterization,  and possible angioplasty. We discussed regarding risks, benefits, alternatives to this including stress testing, CTA and continued medical therapy. Patient wants to proceed. Understands <1-2% risk of death, stroke, MI, urgent CABG, bleeding, infection, renal failure but not limited to these.  Office visit following the work-up/investigations.   Adrian Prows, MD, [facility] 07/18/2018, 4:23 PM [facility]. [address] Pager: [phone] Office: [phone] If no answer Cell [phone]

## 2018-07-19 ENCOUNTER — Ambulatory Visit: Payer: Managed Care, Other (non HMO)

## 2018-07-19 ENCOUNTER — Other Ambulatory Visit: Payer: Managed Care, Other (non HMO)

## 2018-07-19 DIAGNOSIS — R0609 Other forms of dyspnea: Secondary | ICD-10-CM

## 2018-07-19 DIAGNOSIS — I25118 Atherosclerotic heart disease of native coronary artery with other forms of angina pectoris: Secondary | ICD-10-CM

## 2018-07-20 LAB — CBC
HEMATOCRIT: 44.2 % (ref 37.5–51.0)
Hemoglobin: 14.9 g/dL (ref 13.0–17.7)
MCH: 30.8 pg (ref 26.6–33.0)
MCHC: 33.7 g/dL (ref 31.5–35.7)
MCV: 92 fL (ref 79–97)
Platelets: 309 10*3/uL (ref 150–450)
RBC: 4.83 x10E6/uL (ref 4.14–5.80)
RDW: 13 % (ref 11.6–15.4)
WBC: 9.6 10*3/uL (ref 3.4–10.8)

## 2018-07-20 LAB — BASIC METABOLIC PANEL
BUN/Creatinine Ratio: 9 — ABNORMAL LOW (ref 10–24)
BUN: 10 mg/dL (ref 8–27)
CO2: 22 mmol/L (ref 20–29)
Calcium: 8.8 mg/dL (ref 8.6–10.2)
Chloride: 107 mmol/L — ABNORMAL HIGH (ref 96–106)
Creatinine, Ser: 1.14 mg/dL (ref 0.76–1.27)
GFR, EST AFRICAN AMERICAN: 80 mL/min/{1.73_m2} (ref >59)
GFR, EST NON AFRICAN AMERICAN: 70 mL/min/{1.73_m2} (ref >59)
Glucose: 108 mg/dL — ABNORMAL HIGH (ref 65–99)
Potassium: 4.3 mmol/L (ref 3.5–5.2)
Sodium: 142 mmol/L (ref 134–144)

## 2018-07-28 ENCOUNTER — Telehealth: Payer: Self-pay

## 2018-07-28 NOTE — Telephone Encounter (Signed)
Pt called regarding having a cath done on 08/02/18 per provider, pt is hesitant about having procedure done advised that he may want to talk to provider due to last office note and his hx.

## 2018-08-02 ENCOUNTER — Ambulatory Visit (HOSPITAL_COMMUNITY)
Admission: RE | Admit: 2018-08-02 | Discharge: 2018-08-03 | Disposition: A | Discharge status: Home / Self Care | Payer: Managed Care, Other (non HMO) | Attending: Cardiology | Admitting: Cardiology

## 2018-08-02 ENCOUNTER — Other Ambulatory Visit: Payer: Self-pay

## 2018-08-02 ENCOUNTER — Encounter (HOSPITAL_COMMUNITY)
Admission: RE | Disposition: A | Discharge status: Home / Self Care | Payer: Self-pay | Source: Home / Self Care | Attending: Cardiology

## 2018-08-02 DIAGNOSIS — R0609 Other forms of dyspnea: Secondary | ICD-10-CM | POA: Insufficient documentation

## 2018-08-02 DIAGNOSIS — R7302 Impaired glucose tolerance (oral): Secondary | ICD-10-CM | POA: Diagnosis not present

## 2018-08-02 DIAGNOSIS — N529 Male erectile dysfunction, unspecified: Secondary | ICD-10-CM | POA: Insufficient documentation

## 2018-08-02 DIAGNOSIS — Z87891 Personal history of nicotine dependence: Secondary | ICD-10-CM | POA: Diagnosis not present

## 2018-08-02 DIAGNOSIS — Z79899 Other long term (current) drug therapy: Secondary | ICD-10-CM | POA: Insufficient documentation

## 2018-08-02 DIAGNOSIS — Z9861 Coronary angioplasty status: Secondary | ICD-10-CM

## 2018-08-02 DIAGNOSIS — Z951 Presence of aortocoronary bypass graft: Secondary | ICD-10-CM

## 2018-08-02 DIAGNOSIS — Z7982 Long term (current) use of aspirin: Secondary | ICD-10-CM | POA: Insufficient documentation

## 2018-08-02 DIAGNOSIS — I10 Essential (primary) hypertension: Secondary | ICD-10-CM | POA: Diagnosis not present

## 2018-08-02 DIAGNOSIS — I25810 Atherosclerosis of coronary artery bypass graft(s) without angina pectoris: Secondary | ICD-10-CM | POA: Diagnosis not present

## 2018-08-02 DIAGNOSIS — I252 Old myocardial infarction: Secondary | ICD-10-CM | POA: Diagnosis not present

## 2018-08-02 DIAGNOSIS — I209 Angina pectoris, unspecified: Secondary | ICD-10-CM | POA: Diagnosis present

## 2018-08-02 DIAGNOSIS — I2510 Atherosclerotic heart disease of native coronary artery without angina pectoris: Secondary | ICD-10-CM | POA: Diagnosis present

## 2018-08-02 DIAGNOSIS — Z955 Presence of coronary angioplasty implant and graft: Secondary | ICD-10-CM

## 2018-08-02 DIAGNOSIS — I25118 Atherosclerotic heart disease of native coronary artery with other forms of angina pectoris: Secondary | ICD-10-CM

## 2018-08-02 HISTORY — PX: LEFT HEART CATH AND CORS/GRAFTS ANGIOGRAPHY: CATH118250

## 2018-08-02 HISTORY — PX: CORONARY STENT INTERVENTION: CATH118234

## 2018-08-02 LAB — POCT ACTIVATED CLOTTING TIME
Activated Clotting Time: 186 seconds
Activated Clotting Time: 274 seconds
Activated Clotting Time: 274 seconds

## 2018-08-02 SURGERY — LEFT HEART CATH AND CORS/GRAFTS ANGIOGRAPHY
Anesthesia: LOCAL

## 2018-08-02 MED ORDER — NITROGLYCERIN 1 MG/10 ML FOR IR/CATH LAB
INTRA_ARTERIAL | Status: AC
  Filled 2018-08-02: qty 10

## 2018-08-02 MED ORDER — NITROGLYCERIN 0.4 MG SL SUBL: 0.4000 mg | SUBLINGUAL_TABLET | SUBLINGUAL | Status: DC | PRN

## 2018-08-02 MED ORDER — THE SENSUOUS HEART BOOK
Freq: Once | Status: AC
  Administered 2018-08-02: 21:00:00

## 2018-08-02 MED ORDER — SODIUM CHLORIDE 0.9% FLUSH: 3.0000 mL | INTRAVENOUS | Status: DC | PRN

## 2018-08-02 MED ORDER — NITROGLYCERIN 1 MG/10 ML FOR IR/CATH LAB
INTRA_ARTERIAL | Status: DC | PRN
  Administered 2018-08-02 (×5): 200 ug

## 2018-08-02 MED ORDER — ANGIOPLASTY BOOK
Freq: Once | Status: AC
  Administered 2018-08-02: 21:00:00

## 2018-08-02 MED ORDER — SODIUM CHLORIDE 0.9 % WEIGHT BASED INFUSION: 1.0000 mL/kg/h | INTRAVENOUS | Status: DC

## 2018-08-02 MED ORDER — CLOPIDOGREL BISULFATE 300 MG PO TABS
ORAL_TABLET | ORAL | Status: AC
  Filled 2018-08-02: qty 2

## 2018-08-02 MED ORDER — HEPARIN SODIUM (PORCINE) 1000 UNIT/ML IJ SOLN
INTRAMUSCULAR | Status: AC
  Filled 2018-08-02: qty 1

## 2018-08-02 MED ORDER — FENTANYL CITRATE (PF) 100 MCG/2ML IJ SOLN
INTRAMUSCULAR | Status: DC | PRN
  Administered 2018-08-02: 50 ug via INTRAVENOUS
  Administered 2018-08-02 (×4): 25 ug via INTRAVENOUS
  Administered 2018-08-02: 50 ug via INTRAVENOUS

## 2018-08-02 MED ORDER — DIAZEPAM 5 MG PO TABS
5.0000 mg | ORAL_TABLET | ORAL | Status: DC | PRN
  Administered 2018-08-02: 22:00:00 5 mg via ORAL
  Filled 2018-08-02: qty 1

## 2018-08-02 MED ORDER — LOSARTAN POTASSIUM 50 MG PO TABS
50.0000 mg | ORAL_TABLET | Freq: Every day | ORAL | Status: DC
  Administered 2018-08-03: 50 mg via ORAL
  Filled 2018-08-02 (×2): qty 1

## 2018-08-02 MED ORDER — FAMOTIDINE IN NACL 20-0.9 MG/50ML-% IV SOLN
INTRAVENOUS | Status: AC
  Filled 2018-08-02: qty 50

## 2018-08-02 MED ORDER — ASPIRIN 81 MG PO CHEW
81.0000 mg | CHEWABLE_TABLET | Freq: Every day | ORAL | Status: DC
  Administered 2018-08-03: 10:00:00 81 mg via ORAL
  Filled 2018-08-02: qty 1

## 2018-08-02 MED ORDER — LIDOCAINE HCL (PF) 1 % IJ SOLN
INTRAMUSCULAR | Status: AC
  Filled 2018-08-02: qty 30

## 2018-08-02 MED ORDER — FENTANYL CITRATE (PF) 100 MCG/2ML IJ SOLN
INTRAMUSCULAR | Status: AC
  Filled 2018-08-02: qty 2

## 2018-08-02 MED ORDER — MIDAZOLAM HCL 2 MG/2ML IJ SOLN
INTRAMUSCULAR | Status: AC
  Filled 2018-08-02: qty 2

## 2018-08-02 MED ORDER — CLOPIDOGREL BISULFATE 75 MG PO TABS
75.0000 mg | ORAL_TABLET | Freq: Every day | ORAL | Status: DC
  Administered 2018-08-03: 10:00:00 75 mg via ORAL
  Filled 2018-08-02: qty 1

## 2018-08-02 MED ORDER — SODIUM CHLORIDE 0.9% FLUSH
3.0000 mL | Freq: Two times a day (BID) | INTRAVENOUS | Status: DC
  Administered 2018-08-02 (×2): 3 mL via INTRAVENOUS

## 2018-08-02 MED ORDER — SODIUM CHLORIDE 0.9 % WEIGHT BASED INFUSION
3.0000 mL/kg/h | INTRAVENOUS | Status: DC
  Administered 2018-08-02: 3 mL/kg/h via INTRAVENOUS

## 2018-08-02 MED ORDER — EZETIMIBE 10 MG PO TABS
10.0000 mg | ORAL_TABLET | Freq: Every day | ORAL | Status: DC
  Administered 2018-08-02 – 2018-08-03 (×2): 10 mg via ORAL
  Filled 2018-08-02 (×2): qty 1

## 2018-08-02 MED ORDER — HEPARIN (PORCINE) IN NACL 1000-0.9 UT/500ML-% IV SOLN
INTRAVENOUS | Status: AC
  Filled 2018-08-02: qty 1000

## 2018-08-02 MED ORDER — ASPIRIN 81 MG PO CHEW
81.0000 mg | CHEWABLE_TABLET | ORAL | Status: AC
  Administered 2018-08-02: 81 mg via ORAL
  Filled 2018-08-02: qty 1

## 2018-08-02 MED ORDER — CLOPIDOGREL BISULFATE 300 MG PO TABS
ORAL_TABLET | ORAL | Status: DC | PRN
  Administered 2018-08-02: 600 mg via ORAL

## 2018-08-02 MED ORDER — HEPARIN SODIUM (PORCINE) 1000 UNIT/ML IJ SOLN
INTRAMUSCULAR | Status: DC | PRN
  Administered 2018-08-02: 3000 [IU] via INTRAVENOUS
  Administered 2018-08-02: 2000 [IU] via INTRAVENOUS
  Administered 2018-08-02: 3000 [IU] via INTRAVENOUS
  Administered 2018-08-02: 8000 [IU] via INTRAVENOUS
  Administered 2018-08-02: 5000 [IU] via INTRAVENOUS

## 2018-08-02 MED ORDER — SODIUM CHLORIDE 0.9% FLUSH: 3.0000 mL | Freq: Two times a day (BID) | INTRAVENOUS | Status: DC

## 2018-08-02 MED ORDER — IOHEXOL 350 MG/ML SOLN
INTRAVENOUS | Status: DC | PRN
  Administered 2018-08-02: 265 mL via INTRAVENOUS

## 2018-08-02 MED ORDER — SODIUM CHLORIDE 0.9 % IV SOLN: 250.0000 mL | INTRAVENOUS | Status: DC | PRN

## 2018-08-02 MED ORDER — HYDRALAZINE HCL 20 MG/ML IJ SOLN: 5.0000 mg | INTRAMUSCULAR | Status: AC | PRN

## 2018-08-02 MED ORDER — SODIUM CHLORIDE 0.9 % IV SOLN
INTRAVENOUS | Status: AC
  Administered 2018-08-02: 18:00:00 via INTRAVENOUS

## 2018-08-02 MED ORDER — CARVEDILOL 3.125 MG PO TABS
3.1250 mg | ORAL_TABLET | Freq: Two times a day (BID) | ORAL | Status: DC
  Filled 2018-08-02: qty 1

## 2018-08-02 MED ORDER — FAMOTIDINE 20 MG IN NS 100 ML IVPB
20.0000 mg | Freq: Once | INTRAVENOUS | Status: DC
  Filled 2018-08-02: qty 100

## 2018-08-02 MED ORDER — LABETALOL HCL 5 MG/ML IV SOLN: 10.0000 mg | INTRAVENOUS | Status: AC | PRN

## 2018-08-02 MED ORDER — ACETAMINOPHEN 325 MG PO TABS: 650.0000 mg | ORAL_TABLET | ORAL | Status: DC | PRN

## 2018-08-02 MED ORDER — MIDAZOLAM HCL 2 MG/2ML IJ SOLN
INTRAMUSCULAR | Status: DC | PRN
  Administered 2018-08-02: 1 mg via INTRAVENOUS

## 2018-08-02 MED ORDER — HEPARIN (PORCINE) IN NACL 1000-0.9 UT/500ML-% IV SOLN
INTRAVENOUS | Status: AC
  Filled 2018-08-02: qty 500

## 2018-08-02 MED ORDER — LIDOCAINE HCL (PF) 1 % IJ SOLN
INTRAMUSCULAR | Status: DC | PRN
  Administered 2018-08-02: 20 mL

## 2018-08-02 MED ORDER — HEPARIN (PORCINE) IN NACL 1000-0.9 UT/500ML-% IV SOLN
INTRAVENOUS | Status: DC | PRN
  Administered 2018-08-02 (×3): 500 mL

## 2018-08-02 MED ORDER — ZOLPIDEM TARTRATE 5 MG PO TABS: 5.0000 mg | ORAL_TABLET | Freq: Every evening | ORAL | Status: DC | PRN

## 2018-08-02 MED ORDER — PANTOPRAZOLE SODIUM 40 MG PO TBEC
80.0000 mg | DELAYED_RELEASE_TABLET | Freq: Every day | ORAL | Status: DC
  Administered 2018-08-02 – 2018-08-03 (×2): 80 mg via ORAL
  Filled 2018-08-02 (×2): qty 2

## 2018-08-02 MED ORDER — ONDANSETRON HCL 4 MG/2ML IJ SOLN
4.0000 mg | Freq: Four times a day (QID) | INTRAMUSCULAR | Status: DC | PRN
  Administered 2018-08-02 (×2): 4 mg via INTRAVENOUS
  Filled 2018-08-02 (×2): qty 2

## 2018-08-02 SURGICAL SUPPLY — 27 items
BALLN SAPPHIRE 2.5X10 (BALLOONS) ×2
BALLN SAPPHIRE 3.0X10 (BALLOONS) ×2
BALLN SAPPHIRE ~~LOC~~ 2.5X10 (BALLOONS) ×1 IMPLANT
BALLN SAPPHIRE ~~LOC~~ 3.5X10 (BALLOONS) ×1 IMPLANT
BALLN WOLVERINE 2.50X10 (BALLOONS) ×2
BALLOON SAPPHIRE 2.5X10 (BALLOONS) IMPLANT
BALLOON SAPPHIRE 3.0X10 (BALLOONS) IMPLANT
BALLOON WOLVERINE 2.50X10 (BALLOONS) IMPLANT
CATH INFINITI 5 FR IM (CATHETERS) ×1 IMPLANT
CATH INFINITI 5FR MPB2 (CATHETERS) ×1 IMPLANT
CATH VISTA GUIDE 6FR XB3.5 (CATHETERS) ×1 IMPLANT
CLOSURE MYNX CONTROL 6F/7F (Vascular Products) ×1 IMPLANT
KIT ENCORE 26 ADVANTAGE (KITS) ×2 IMPLANT
KIT HEART LEFT (KITS) ×2 IMPLANT
KIT MICROPUNCTURE NIT STIFF (SHEATH) ×1 IMPLANT
PACK CARDIAC CATHETERIZATION (CUSTOM PROCEDURE TRAY) ×2 IMPLANT
SHEATH PINNACLE 5F 10CM (SHEATH) ×1 IMPLANT
SHEATH PINNACLE 6F 10CM (SHEATH) ×1 IMPLANT
SHEATH PROBE COVER 6X72 (BAG) ×1 IMPLANT
STENT SYNERGY DES 2.25X12 (Permanent Stent) ×1 IMPLANT
STENT SYNERGY DES 3.5X16 (Permanent Stent) ×1 IMPLANT
TRANSDUCER W/STOPCOCK (MISCELLANEOUS) ×2 IMPLANT
TUBING CIL FLEX 10 FLL-RA (TUBING) ×2 IMPLANT
WIRE COUGAR XT STRL 190CM (WIRE) ×1 IMPLANT
WIRE EMERALD 3MM-J .035X150CM (WIRE) ×1 IMPLANT
WIRE EMERALD 3MM-J .035X260CM (WIRE) ×1 IMPLANT
WIRE MINAMO 190 (WIRE) ×1 IMPLANT

## 2018-08-02 NOTE — Interval H&P Note (Signed)
History and Physical Interval Note:  08/02/2018 12:14 PM  Samuel Hawkins  has presented today for surgery, with the diagnosis of coronary artery disease, shortness of breath.  The various methods of treatment have been discussed with the patient and family. After consideration of risks, benefits and other options for treatment, the patient has consented to  Procedure(s): LEFT HEART CATH AND CORS/GRAFTS ANGIOGRAPHY (N/A) and angioplasty as a surgical intervention.  The patient's history has been reviewed, patient examined, no change in status, stable for surgery.  I have reviewed the patient's chart and labs.  Questions were answered to the patient's satisfaction.     Symptom Status: Ischemic Symptoms Non-invasive Testing: Not done If no or indeterminate stress test, FFR/iFR results in all diseased vessels: Not done Diabetes Mellitus: No S/P CABG: Yes Antianginal therapy (number of long-acting drugs): 1 Patient undergoing renal transplant: No Patient undergoing percutaneous valve procedure: No  LIMA-LAD patent and without significant stenoses Stenosis supplying 1 territory (bypass graft or native artery) other than anterior  PCI: Not rated  CABG: Not rated Stenoses supplying 2 territories (bypass graft or native artery, either 2 separate vessels or sequential graft supplying 2 territories) not including anterior territory  PCI: Not rated  CABG: Not rated  LIMA-LAD not patent Stenosis supplying 1 territory (bypass graft or native artery)-anterior (LAD) territory  PCI: Not rated  CABG: Not rated Stenoses supplying 2 territories (bypass graft or native artery, either 2 separate vessels or sequential graft supplying 2 territories)-LAD plus other territory  PCI: Not rated  CABG: Not rated Stenoses supplying 3 territories (bypass graft or native arteries, separate vessels, sequential grafts, or combination thereof)-LAD plus 2 other territories  PCI: Not rated  CABG: Not rated  Notes:  A  indicates appropriate. M indicates may be appropriate. R indicates rarely appropriate. Number in parentheses is median score for that indication. Reclassify indicates number of functionally diseased vessels should be decreased given negative FFR/iFR. Re-evaluate the scenario interpreting any FFR/iFR negative vessel as being not significantly stenosed.  Journal of the [employer] of Cardiology Mar 2017, 23391; DOI: 10.1016/j.jacc.2017.02.001 [email or web address].full-text.pdf This App  2018 by the Society for Cardiovascular Angiography and Interventions   Yates Decamp

## 2018-08-03 ENCOUNTER — Encounter (HOSPITAL_COMMUNITY): Payer: Self-pay | Admitting: Cardiology

## 2018-08-03 ENCOUNTER — Encounter: Payer: Self-pay | Admitting: Cardiology

## 2018-08-03 DIAGNOSIS — I25118 Atherosclerotic heart disease of native coronary artery with other forms of angina pectoris: Secondary | ICD-10-CM | POA: Diagnosis not present

## 2018-08-03 DIAGNOSIS — Z951 Presence of aortocoronary bypass graft: Secondary | ICD-10-CM

## 2018-08-03 DIAGNOSIS — I25810 Atherosclerosis of coronary artery bypass graft(s) without angina pectoris: Secondary | ICD-10-CM | POA: Diagnosis not present

## 2018-08-03 LAB — BASIC METABOLIC PANEL
ANION GAP: 9 (ref 5–15)
BUN: 9 mg/dL (ref 6–20)
CO2: 23 mmol/L (ref 22–32)
Calcium: 8.5 mg/dL — ABNORMAL LOW (ref 8.9–10.3)
Chloride: 105 mmol/L (ref 98–111)
Creatinine, Ser: 0.93 mg/dL (ref 0.61–1.24)
GFR calc Af Amer: >60 mL/min (ref >60)
GFR calc non Af Amer: >60 mL/min (ref >60)
Glucose, Bld: 135 mg/dL — ABNORMAL HIGH (ref 70–99)
Potassium: 4.1 mmol/L (ref 3.5–5.1)
Sodium: 137 mmol/L (ref 135–145)

## 2018-08-03 LAB — CBC
HCT: 40.9 % (ref 39.0–52.0)
HEMOGLOBIN: 14.3 g/dL (ref 13.0–17.0)
MCH: 31 pg (ref 26.0–34.0)
MCHC: 35 g/dL (ref 30.0–36.0)
MCV: 88.5 fL (ref 80.0–100.0)
NRBC: 0 % (ref 0.0–0.2)
Platelets: 287 10*3/uL (ref 150–400)
RBC: 4.62 MIL/uL (ref 4.22–5.81)
RDW: 12.9 % (ref 11.5–15.5)
WBC: 10.4 10*3/uL (ref 4.0–10.5)

## 2018-08-03 MED ORDER — PANTOPRAZOLE SODIUM 40 MG PO TBEC: 40.0000 mg | DELAYED_RELEASE_TABLET | Freq: Every day | ORAL | 6 refills | Status: DC

## 2018-08-03 MED ORDER — CLOPIDOGREL BISULFATE 75 MG PO TABS: 75.0000 mg | ORAL_TABLET | Freq: Every day | ORAL | 3 refills | Status: DC

## 2018-08-03 MED FILL — Famotidine in NaCl 0.9% IV Soln 20 MG/50ML: INTRAVENOUS | Qty: 50 | Status: AC

## 2018-08-03 NOTE — Progress Notes (Signed)
CARDIAC REHAB PHASE I   PRE:  Rate/Rhythm: 58 SB  BP:  Sitting: 164/90      SaO2: 98 RA  MODE:  Ambulation: 500 ft   POST:  Rate/Rhythm: 66 SR  BP:  Sitting: 146/78    SaO2: 99 RA   Pt ambulated 547ft in hallway independently with steady gait. Pt denies CP or SOB. Pt educated on importance of ASA and Plavix. Pt given heart healthy diet. Stent card at bedside. Reviewed restrictions and exercise guidelines. Will refer to CRP II [place]. Pt aware of that there will be a delay in getting signed up.  [id number] Reynold Bowen, RN BSN 08/03/2018 8:57 AM

## 2018-08-03 NOTE — Discharge Summary (Signed)
Physician Discharge Summary  Patient ID: Alva Mixell MRN: [id number] DOB/AGE: 12-22-1955 63 y.o.  Admit date: 08/02/2018 Discharge date: 08/03/2018  Primary Discharge Diagnosis 1.  Coronary artery disease native vessel with angina pectoris/anginal equivalent dyspnea 2.  Coronary artery disease of the bypass vein grafts with angina pectoris 3.  Mixed hyperlipidemia, intolerant to statins, presently on PCSK9 inhibitors 4.  Hypertension.  Significant Diagnostic Studies: Coronary angiogram 08/02/2018:  PTCA and complex and prolonged procedure involving stenting of high OM1 with 2.25 x 12 mm Synergy DES, P stenting of the main circumflex proximal branch with 3.5 x 15 mm Synergy DES, stenosis reduced in the OM1 from 80% to 0% with TIMI-3 to TIMI-3 flow and in the main circumflex from 99% to 0% with TIMI-3 to TIMI-3 flow.  There was significant improvement noted through antegrade filling of a large OM 2 which is subtotally occluded and diffusely diseased and also distal PDA branches of codominant circumflex. Mid RCA is occluded.  RIMA to RCA patent. Left main is short.  LAD occluded in the midsegment.  D1 has mild disease in the ostium.  LIMA to LAD is widely patent. Occluded SVG to ramus intermediate and OM1 and SVG to D1.  Recommendation: He will need aspirin indefinitely and Plavix for at least  6 months.  Hospital Course: Admitted for elective coronary angiography due to worsening shortness of breath and dyspnea on exertion, his anginal equivalent.  Patient given prior to his non-ST elevation myocardial infarction when he underwent CABG, never had any chest pain, presented with syncope.  Coronary angiography revealed occluded saphenous vein graft to ramus and OM, hence angioplasty, fairly complex was performed yesterday and he tolerated the procedure well, telemetry revealed no significant arrhythmias, he was felt stable for discharge today.  Patient is lipids have been discussed, dietary  modification discussed with the patient.  His blood pressure was elevated today, beta-blockers were held yesterday due to asymptomatic bradycardia.  He will resume low-dose carvedilol.  Will address hypertension in the outpatient basis if present.  Discharge Exam: Blood pressure (!) 164/90, pulse 62, temperature 98 F (36.7 C), temperature source Oral, resp. rate 18, height 5\' 7"  (1.702 m), weight 91.4 kg, SpO2 96 %.   General appearance: alert, cooperative, appears stated age and no distress Resp: clear to auscultation bilaterally Cardio: regular rate and rhythm, S1, S2 normal, no murmur, click, rub or gallop GI: soft, non-tender; bowel sounds normal; no masses,  no organomegaly Extremities: extremities normal, atraumatic, no cyanosis or edema Pulses: 2+ and symmetric Incision/Wound: Labs:   Lab Results  Component Value Date   WBC 10.4 08/03/2018   HGB 14.3 08/03/2018   HCT 40.9 08/03/2018   MCV 88.5 08/03/2018   PLT 287 08/03/2018    Recent Labs  Lab 08/03/18 0117  NA 137  K 4.1  CL 105  CO2 23  BUN 9  CREATININE 0.93  CALCIUM 8.5*  GLUCOSE 135*    Lipid Panel     Component Value Date/Time   CHOL 59 (L) 03/25/2018 1222   TRIG 52 03/25/2018 1222   TRIG 240 (HH) 04/30/2006 0816   HDL 30 (L) 03/25/2018 1222   CHOLHDL 2.0 03/25/2018 1222   CHOLHDL 5.8 (H) 03/09/2016 0952   VLDL 28 03/09/2016 0952   LDLCALC 19 03/25/2018 1222    BNP (last 3 results) No results for input(s): BNP in the last 8760 hours.  HEMOGLOBIN A1C Lab Results  Component Value Date   HGBA1C 5.8 (H) 03/09/2016   MPG 120  03/09/2016    Cardiac Panel (last 3 results) No results for input(s): CKTOTAL, CKMB, TROPONINI, RELINDX in the last 8760 hours.  Lab Results  Component Value Date   CKTOTAL 121 05/01/2011   CKMB 4.1 (H) 05/01/2011   TROPONINI <0.30 05/01/2011     TSH No results for input(s): TSH in the last 8760 hours.  EKG: EKG 08/03/2018: Normal sinus rhythm/sinus bradycardia at  rate of 59 bpm, normal axis.  No evidence of ischemia, normal EKG..   FOLLOW UP PLANS AND APPOINTMENTS Discharge Instructions    Amb Referral to Cardiac Rehabilitation   Complete by:  As directed    Diagnosis:  Coronary Stents     Allergies as of 08/03/2018      Reactions   Atorvastatin Other (See Comments)   muscle cramp   Simvastatin Other (See Comments)   muscle cramp      Medication List    STOP taking these medications   omeprazole 40 MG capsule Commonly known as:  PRILOSEC   rosuvastatin 40 MG tablet Commonly known as:  CRESTOR     TAKE these medications   aspirin 81 MG chewable tablet Chew 81 mg by mouth daily.   carvedilol 3.125 MG tablet Commonly known as:  COREG TAKE ONE TABLET BY MOUTH TWICE A DAY   clopidogrel 75 MG tablet Commonly known as:  Plavix Take 1 tablet (75 mg total) by mouth daily.   Evolocumab 140 MG/ML Soaj Commonly known as:  Repatha SureClick Inject 140 mg into the skin every 14 (fourteen) days.   ezetimibe 10 MG tablet Commonly known as:  ZETIA TAKE ONE TABLET BY MOUTH DAILY What changed:  when to take this   losartan 50 MG tablet Commonly known as:  COZAAR TAKE ONE TABLET BY MOUTH DAILY   nitroGLYCERIN 0.4 MG SL tablet Commonly known as:  NITROSTAT Place 0.4 mg under the tongue every 5 (five) minutes as needed for chest pain.   pantoprazole 40 MG tablet Commonly known as:  Protonix Take 1 tablet (40 mg total) by mouth daily before breakfast for 180 doses. As needed for heart burn      Follow-up Information    Yates Decamp, MD. Call.   Specialty:  Cardiology Why:  To be seen in 2 weeks Contact information: [address] [place] [address] [phone]            Yates Decamp, MD 08/03/2018, 9:40 AM  Pager: [phone] Office: [phone] If no answer: [phone]

## 2018-08-04 ENCOUNTER — Other Ambulatory Visit: Payer: Self-pay

## 2018-08-04 MED ORDER — NITROGLYCERIN 0.4 MG SL SUBL: 0.4000 mg | SUBLINGUAL_TABLET | SUBLINGUAL | 1 refills | Status: AC | PRN

## 2018-08-05 ENCOUNTER — Telehealth: Payer: Self-pay

## 2018-08-05 ENCOUNTER — Telehealth (HOSPITAL_COMMUNITY): Payer: Self-pay

## 2018-08-05 NOTE — Telephone Encounter (Signed)
Pt is on tcm list. Pt was admitted for elective coronary angiography due to worsening sob.  Pt to follow up with cardiology

## 2018-08-05 NOTE — Telephone Encounter (Signed)
Attempted to call patient in regards to Cardiac Rehab - pt does not have his voicemail set up, unable to leave VM

## 2018-08-05 NOTE — Telephone Encounter (Signed)
Pt insurance is active and benefits verified through [place]. Co-pay $50.00, DED $1,200.00/$0.00 met, out of pocket $7,350.00/$261.36 met, co-insurance 0%. No pre-authorization required. Kristol/[employer], 08/05/2018 @ 945AM, REF# [id number]  Will contact patient to see if he is interested in the Cardiac Rehab Program. If interested, patient will need to complete follow up appt. Once completed, patient will be contacted for scheduling upon review by the RN Navigator.

## 2018-08-10 ENCOUNTER — Encounter: Payer: Self-pay | Admitting: Cardiology

## 2018-08-10 ENCOUNTER — Ambulatory Visit (INDEPENDENT_AMBULATORY_CARE_PROVIDER_SITE_OTHER): Payer: Managed Care, Other (non HMO) | Admitting: Cardiology

## 2018-08-10 ENCOUNTER — Other Ambulatory Visit: Payer: Self-pay

## 2018-08-10 VITALS — BP 123/76 | HR 58 | Ht 67.0 in | Wt 203.7 lb

## 2018-08-10 DIAGNOSIS — I25810 Atherosclerosis of coronary artery bypass graft(s) without angina pectoris: Secondary | ICD-10-CM | POA: Diagnosis not present

## 2018-08-10 NOTE — Progress Notes (Signed)
Patient is here for follow up visit.  Subjective:   Samuel Hawkins, male    DOB: [birth year], 63 y.o.   MRN: [id number]   Chief Complaint  Patient presents with  . Follow-up    7-10 day cath    HPI  63 year old with coronary artery disease, history of MI in 2012, status post CABG (LIMA-LAD, RIMA-dRCA, other venous grafts occluded), with recent syncope sucpicious for ischemia etiology. He underwent sccessful complex PCI on 08/02/2018 by Dr. Einar Gip.(Synergy DES 2.25 X 12 mm to high OM1, Synergy DES 3.5 X 15 mm pLCx).   Patient is doing well since his PCI. He denies chest pain, shortness of breath, palpitations, leg edema, orthopnea, PND, TIA/syncope. He is compliant with his medical therapy.   Past Medical History:  Diagnosis Date  . CAD (coronary artery disease) 04/2012   Dr. Einar Gip: EF 55%; dominant right with diffuse 80% mid vessel disease. Just prior to bifurcation is a 90% stenosis.  D1 90% & LAD 70% after D1 - FFR positive. Large D2 noted.  Proximal circumflex 90% at OM1, ostial OM1 80%. Subtotal OM 2 with TIMI 1 flow (large vessel with several branches).  Ostial ramus 80%.  . ERECTILE DYSFUNCTION 05/01/2008  . Glucose intolerance (impaired glucose tolerance)    history of  . History of erectile dysfunction   . Hyperlipidemia   . HYPERLIPIDEMIA 05/01/2008  . Hypertension   . Impaired glucose tolerance 07/13/2011  . 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     Past Surgical History:  Procedure Laterality Date  . CARDIAC CATHETERIZATION    . CORONARY ARTERY BYPASS GRAFT  05/04/2011   Procedure: CORONARY ARTERY BYPASS GRAFTING (CABG);  Surgeon: Melrose Nakayama, MD;  Location: [facility];  Service: Open Heart Surgery;  Laterality: N/A;  CABG x  six;  using bilateral  internal mammary arteries and left leg greater saphenous vein harvested endoscopically --LIMA-LAD, RIMA-dRCA, SeqSVG-OM3-OM4  . CORONARY STENT INTERVENTION N/A  08/02/2018   Procedure: CORONARY STENT INTERVENTION;  Surgeon: Adrian Prows, MD;  Location: [facility];  Service: Cardiovascular;  Laterality: N/A;  . LEFT HEART CATH AND CORS/GRAFTS ANGIOGRAPHY N/A 08/02/2018   Procedure: LEFT HEART CATH AND CORS/GRAFTS ANGIOGRAPHY;  Surgeon: Adrian Prows, MD;  Location: [facility];  Service: Cardiovascular;  Laterality: N/A;  . LEFT HEART CATHETERIZATION WITH CORONARY ANGIOGRAM N/A 04/30/2011   Procedure: LEFT HEART CATHETERIZATION WITH CORONARY ANGIOGRAM;  Surgeon: Laverda Page, MD;  Location: [facility]: EF 55%; dominant right with diffuse 80% mid vessel disease. Just prior to bifurcation is a 90% stenosis.  D1 90% & LAD 70% after D1 - FFR positive. Large D2 noted.  Proximal circumflex 90% at OM1, ost OM1 80%. Subtotal OM 2 w/ TIMI 1 flow (large w/ several branches), Ost RI 80%  . NM MYOVIEW LTD  11/2012   Stress EKG showed 1.5 mm ST depression at peak exercise recovering less than 2 minutes into recovery. He exercised for 11:52 min. 12.9 METS. Small-moderate-sized inferior-inferolateral transmural scar without infarction. Mild inferior wall hypokinesis. EF 61%. LOW RISK  . NM MYOVIEW LTD  08/26/2016   Exercise 11:41 min 12.7 MET S -peak heart rate 162 BPM (90% of max predicted). Normal blood pressure response = excellent exercise capacity..  EF 50%. Medium size, moderate intensity perfusion defect in the basal inferior-basal lateral and inferolateral wall consistent with prior infarction. No evidence of reversibility/ischemia. (Similar to previous study)  . TRANSTHORACIC ECHOCARDIOGRAM  04/2001   EF 55-60%. Mild posterior-inferior hypokinesis. GR 1 DD.     Social History   Socioeconomic History  . Marital status: Divorced    Spouse name: Not on file  . Number of children: 1  . Years of education: Not on file  . Highest education level: Not on file  Occupational History  . Occupation: Ambulance person: [employer]   . Financial resource strain: Not on file  . Food insecurity:    Worry: Not on file    Inability: Not on file  . Transportation needs:    Medical: Not on file    Non-medical: Not on file  Tobacco Use  . Smoking status: Former Smoker    Types: Cigarettes    Last attempt to quit: 05/10/2011    Years since quitting: 7.2  . Smokeless tobacco: Never Used  . Tobacco comment: social smoker only prior to MI  Substance and Sexual Activity  . Alcohol use: Not Currently  . Drug use: No  . Sexual activity: Never  Lifestyle  . Physical activity:    Days per week: Not on file    Minutes per session: Not on file  . Stress: Not on file  Relationships  . Social connections:    Talks on phone: Not on file    Gets together: Not on file    Attends religious service: Not on file    Active member of club or organization: Not on file    Attends meetings of clubs or organizations: Not on file    Relationship status: Not on file  . Intimate partner violence:    Fear of current or ex partner: Not on file    Emotionally abused: Not on file    Physically abused: Not on file    Forced sexual activity: Not on file  Other Topics Concern  . Not on file  Social History Narrative  . Not on file     Current Outpatient Medications on File Prior to Visit  Medication Sig Dispense Refill  . aspirin 81 MG chewable tablet Chew 81 mg by mouth daily.    . carvedilol (COREG) 3.125 MG tablet TAKE ONE TABLET BY MOUTH TWICE A DAY (Patient taking differently: Take 3.125 mg by mouth 2 (two) times daily. ) 180 tablet 3  . clopidogrel (PLAVIX) 75 MG tablet Take 1 tablet (75 mg total) by mouth daily. 90 tablet 3  . Evolocumab (REPATHA SURECLICK) 409 MG/ML SOAJ Inject 140 mg into the skin every 14 (fourteen) days. 2 pen 12  . ezetimibe (ZETIA) 10 MG tablet TAKE ONE TABLET BY MOUTH DAILY (Patient taking differently: Take 10 mg by mouth every evening. ) 90 tablet 2  . losartan (COZAAR) 50 MG tablet TAKE ONE TABLET BY  MOUTH DAILY (Patient taking differently: Take 50 mg by mouth daily. ) 90 tablet 3  . nitroGLYCERIN (NITROSTAT) 0.4 MG SL tablet Place 1 tablet (0.4 mg total) under the tongue every 5 (five) minutes as needed for chest pain. 15 tablet 1  . pantoprazole (PROTONIX) 40 MG tablet Take 1 tablet (40 mg total) by mouth daily before breakfast for 180 doses. As needed for heart burn 30 tablet 6   No current facility-administered medications on file prior to visit.     Cardiovascular studies:  EKG 08/10/2018: Sinus rhythm 51 bpm Nopnspecific ST-T changes inferior leads  Coronary angiogram 08/02/2018:  PTCA and complex and prolonged procedure involving stenting of high OM1 with 2.25 x  12 mm Synergy DES, P stenting of the main circumflex proximal branch with 3.5 x 15 mm Synergy DES, stenosis reduced in the OM1 from 80% to 0% with TIMI-3 to TIMI-3 flow and in the main circumflex from 99% to 0% with TIMI-3 to TIMI-3 flow.  There was significant improvement noted through antegrade filling of a large OM 2 which is subtotally occluded and diffusely diseased and also distal PDA branches of codominant circumflex. Mid RCA is occluded.  RIMA to RCA patent. Left main is short.  LAD occluded in the midsegment.  D1 has mild disease in the ostium.  LIMA to LAD is widely patent. Occluded SVG to ramus intermediate and OM1 and SVG to D1.  Recent labs: Results for MIQUEL, STACKS "ASH" (MRN [id number]) as of 08/10/2018 10:06  Ref. Range 08/03/2018 41:63  BASIC METABOLIC PANEL Unknown Rpt (A)  Sodium Latest Ref Range: 135 - 145 mmol/L 137  Potassium Latest Ref Range: 3.5 - 5.1 mmol/L 4.1  Chloride Latest Ref Range: 98 - 111 mmol/L 105  CO2 Latest Ref Range: 22 - 32 mmol/L 23  Glucose Latest Ref Range: 70 - 99 mg/dL 135 (H)  BUN Latest Ref Range: 6 - 20 mg/dL 9  Creatinine Latest Ref Range: 0.61 - 1.24 mg/dL 0.93  Calcium Latest Ref Range: 8.9 - 10.3 mg/dL 8.5 (L)  Anion gap Latest Ref Range: 5 - 15  9  GFR, Est Non  African American Latest Ref Range: >60 mL/min >60  GFR, Est African American Latest Ref Range: >60 mL/min >60   Results for ISHMAIL, MCMANAMON "ASH" (MRN [id number]) as of 08/10/2018 10:06  Ref. Range 08/03/2018 01:17  WBC Latest Ref Range: 4.0 - 10.5 K/uL 10.4  RBC Latest Ref Range: 4.22 - 5.81 MIL/uL 4.62  Hemoglobin Latest Ref Range: 13.0 - 17.0 g/dL 14.3  HCT Latest Ref Range: 39.0 - 52.0 % 40.9  MCV Latest Ref Range: 80.0 - 100.0 fL 88.5  MCH Latest Ref Range: 26.0 - 34.0 pg 31.0  MCHC Latest Ref Range: 30.0 - 36.0 g/dL 35.0  RDW Latest Ref Range: 11.5 - 15.5 % 12.9  Platelets Latest Ref Range: 150 - 400 K/uL 287  nRBC Latest Ref Range: 0.0 - 0.2 % 0.0    Review of Systems  Constitution: Negative for decreased appetite, malaise/fatigue, weight gain and weight loss.  HENT: Negative for congestion.   Eyes: Negative for visual disturbance.  Cardiovascular: Negative for chest pain, claudication, dyspnea on exertion, leg swelling, palpitations and syncope.  Respiratory: Negative for shortness of breath.   Endocrine: Negative for cold intolerance.  Hematologic/Lymphatic: Does not bruise/bleed easily.  Skin: Negative for itching and rash.  Musculoskeletal: Negative for myalgias.  Gastrointestinal: Negative for abdominal pain, nausea and vomiting.  Genitourinary: Negative for dysuria.  Neurological: Negative for dizziness and weakness.  Psychiatric/Behavioral: The patient is not nervous/anxious.   All other systems reviewed and are negative.      Objective:    Vitals:   08/10/18 0945  BP: 123/76  Pulse: (!) 58  SpO2: 97%     Physical Exam  Constitutional: He is oriented to person, place, and time. He appears well-developed and well-nourished. No distress.  HENT:  Head: Normocephalic and atraumatic.  Eyes: Pupils are equal, round, and reactive to light. Conjunctivae are normal.  Neck: No JVD present.  Cardiovascular: Normal rate and regular rhythm.  No murmur heard. Right  groin with no hematoma, only mild ecchymosis.  Pulmonary/Chest: Effort normal and breath sounds normal. He has no wheezes.  He has no rales.  Abdominal: Soft. Bowel sounds are normal. There is no rebound.  Musculoskeletal:        General: No edema.  Lymphadenopathy:    He has no cervical adenopathy.  Neurological: He is alert and oriented to person, place, and time. No cranial nerve deficit.  Skin: Skin is warm and dry.  Psychiatric: He has a normal mood and affect.  Nursing note and vitals reviewed.       Assessment & Recommendations:   63 year old with coronary artery disease, history of MI in 2012, status post CABG (LIMA-LAD, RIMA-dRCA, other venous grafts occluded), with recent syncope sucpicious for ischemia etiology. He underwent sccessful complex PCI on 08/02/2018 by Dr. Einar Gip.(Synergy DES 2.25 X 12 mm to high OM1, Synergy DES 3.5 X 15 mm pLCx).   1. Coronary artery disease involving coronary bypass graft of native heart without angina pectoris No angina symptoms. Continue Aspirin/plavix, Repatha, carvedilol, osartan.  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 While he physically may be able to do so, still recommend limiting social contact.   Follow up with Dr. Einar Gip in 6 months.    Nigel Mormon, MD [facility] [place] Pager: [phone] Office: [phone] If no answer Cell [phone]

## 2018-08-10 NOTE — Progress Notes (Signed)
Here you go

## 2018-08-11 ENCOUNTER — Ambulatory Visit: Payer: Managed Care, Other (non HMO) | Admitting: Cardiology

## 2018-08-12 ENCOUNTER — Telehealth (HOSPITAL_COMMUNITY): Payer: Self-pay

## 2018-08-12 NOTE — Telephone Encounter (Signed)
Called patient to see if he is interested in the Cardiac Rehab Program. Patient expressed interest. Explained scheduling process and went over insurance, patient verbalized understanding. Adv pt we are closed at this time due to the COVID-19 and will contact him once we resume scheduling.

## 2018-08-22 ENCOUNTER — Other Ambulatory Visit: Payer: Self-pay | Admitting: Cardiology

## 2018-10-23 ENCOUNTER — Other Ambulatory Visit: Payer: Self-pay | Admitting: Cardiology

## 2018-10-24 NOTE — Telephone Encounter (Signed)
Now follow by DR Einar Gip

## 2018-10-25 ENCOUNTER — Other Ambulatory Visit: Payer: Self-pay | Admitting: Cardiology

## 2018-10-26 ENCOUNTER — Other Ambulatory Visit: Payer: Self-pay | Admitting: Cardiology

## 2018-10-26 ENCOUNTER — Telehealth (HOSPITAL_COMMUNITY): Payer: Self-pay | Admitting: *Deleted

## 2018-10-26 ENCOUNTER — Telehealth: Payer: Self-pay

## 2018-10-26 DIAGNOSIS — E785 Hyperlipidemia, unspecified: Secondary | ICD-10-CM

## 2018-10-26 DIAGNOSIS — E7800 Pure hypercholesterolemia, unspecified: Secondary | ICD-10-CM

## 2018-10-26 DIAGNOSIS — I25810 Atherosclerosis of coronary artery bypass graft(s) without angina pectoris: Secondary | ICD-10-CM

## 2018-10-26 MED ORDER — EVOLOCUMAB 140 MG/ML ~~LOC~~ SOAJ: 140.0000 mg | SUBCUTANEOUS | 4 refills | Status: DC

## 2018-10-26 NOTE — Telephone Encounter (Signed)
Pt pharmacy called and wants you to refill his repatha, is this ok?

## 2018-10-26 NOTE — Telephone Encounter (Signed)
Yes. I sent the Rx

## 2018-10-26 NOTE — Telephone Encounter (Signed)
         Confirm Consent - In the setting of the current Covid19 crisis, you are scheduled for a phone visit with your Cardiac or Pulmonary team member.  Just as we do with many in-gym visits, in order for you to participate in this visit, we must obtain consent.  If you'd like, I can send this to your mychart (if signed up) or email for you to review.  Otherwise, I can obtain your verbal consent now.  By agreeing to a telephone visit, we'd like you to understand that the technology does not allow for your Cardiac or Pulmonary Rehab team member to perform a physical assessment, and thus may limit their ability to fully assess your ability to perform exercise programs. If your provider identifies any concerns that need to be evaluated in person, we will make arrangements to do so.  Finally, though the technology is pretty good, we cannot assure that it will always work on either your or our end and we cannot ensure that we have a secure connection.  Cardiac and Pulmonary Rehab Telehealth visits and "At Home" cardiac and pulmonary rehab are provided at no cost to you.        Are you willing to proceed?"        STAFF: Did the patient verbally acknowledge consent to telehealth visit? Document YES/NO here: Yes     Barnet Pall RN Cardiac and Pulmonary Rehab Staff        June 6,2020 1:58pm Pt is interested in participating in Virtual Cardiac Rehab. Pt advised that Virtual Cardiac Rehab is provided at no cost to the patient.  Checklist:  1. Pt has smart device  ie smartphone and/or ipad for downloading an app  Yes 2. Reliable internet/wifi service    Yes 3. Understands how to use their smartphone and navigate within an app.  Yes 4.  Reviewed with pt the scheduling process for virtual cardiac rehab.  Pt verbalized understanding. Will set up appointment for patient to download APP tomorrow afternoon.Barnet Pall, RN,BSN 10/26/2018 2:00 PM

## 2018-10-27 ENCOUNTER — Telehealth (HOSPITAL_COMMUNITY): Payer: Self-pay | Admitting: *Deleted

## 2018-10-27 ENCOUNTER — Encounter (HOSPITAL_COMMUNITY): Payer: Self-pay

## 2018-11-01 ENCOUNTER — Telehealth (HOSPITAL_COMMUNITY): Payer: Self-pay | Admitting: *Deleted

## 2018-11-01 ENCOUNTER — Encounter (HOSPITAL_COMMUNITY): Payer: Self-pay

## 2018-11-02 ENCOUNTER — Telehealth (HOSPITAL_COMMUNITY): Payer: Self-pay | Admitting: *Deleted

## 2018-11-02 ENCOUNTER — Other Ambulatory Visit: Payer: Self-pay

## 2018-11-02 ENCOUNTER — Encounter (HOSPITAL_COMMUNITY)
Admission: RE | Admit: 2018-11-02 | Discharge: 2018-11-02 | Disposition: A | Discharge status: Home / Self Care | Payer: Self-pay | Source: Ambulatory Visit | Attending: Cardiology | Admitting: Cardiology

## 2018-11-02 NOTE — Telephone Encounter (Signed)
Called and spoke to pt regarding Virtual Cardiac Rehab.  Pt  was able to download the [employer] app on their smart device with no issues. Pt set up their account and received the following welcome message -"Welcome to the [facility] and Pulmonary Rehabilitation program. We hope that you will find the exercise program beneficial in your recovery process. Our staff is available to assist with any questions/concerns about your exercise routine. Best wishes". Brief orientation provided to with the advisement to watch the "Intro to Rehab" series located under the Resource tab. Pt verbalized understanding. Will continue to follow and monitor pt progress with feedback as needed.Barnet Pall, RN,BSN 11/02/2018 12:22 PM

## 2018-11-15 ENCOUNTER — Encounter (HOSPITAL_COMMUNITY): Payer: Self-pay | Admitting: *Deleted

## 2018-11-15 NOTE — Progress Notes (Signed)
Pt inactive in [employer] App.  Letter mailed requesting patient to return call regarding this by 11/29/2018. If no response, will discharge from cardiac rehab program.  

## 2018-12-12 ENCOUNTER — Other Ambulatory Visit: Payer: Self-pay | Admitting: Cardiology

## 2018-12-13 ENCOUNTER — Other Ambulatory Visit: Payer: Self-pay | Admitting: Cardiology

## 2019-01-01 ENCOUNTER — Other Ambulatory Visit: Payer: Self-pay | Admitting: Cardiology

## 2019-01-10 ENCOUNTER — Ambulatory Visit: Payer: Managed Care, Other (non HMO) | Admitting: Internal Medicine

## 2019-01-17 ENCOUNTER — Ambulatory Visit (INDEPENDENT_AMBULATORY_CARE_PROVIDER_SITE_OTHER): Payer: Managed Care, Other (non HMO) | Admitting: Internal Medicine

## 2019-01-17 ENCOUNTER — Other Ambulatory Visit (INDEPENDENT_AMBULATORY_CARE_PROVIDER_SITE_OTHER): Payer: Managed Care, Other (non HMO)

## 2019-01-17 ENCOUNTER — Other Ambulatory Visit: Payer: Self-pay

## 2019-01-17 ENCOUNTER — Encounter: Payer: Self-pay | Admitting: Internal Medicine

## 2019-01-17 VITALS — BP 124/84 | HR 53 | Temp 98.2°F | Ht 67.0 in | Wt 195.0 lb

## 2019-01-17 DIAGNOSIS — Z1159 Encounter for screening for other viral diseases: Secondary | ICD-10-CM

## 2019-01-17 DIAGNOSIS — E559 Vitamin D deficiency, unspecified: Secondary | ICD-10-CM

## 2019-01-17 DIAGNOSIS — F528 Other sexual dysfunction not due to a substance or known physiological condition: Secondary | ICD-10-CM

## 2019-01-17 DIAGNOSIS — R202 Paresthesia of skin: Secondary | ICD-10-CM | POA: Diagnosis not present

## 2019-01-17 DIAGNOSIS — E611 Iron deficiency: Secondary | ICD-10-CM

## 2019-01-17 DIAGNOSIS — R7302 Impaired glucose tolerance (oral): Secondary | ICD-10-CM | POA: Diagnosis not present

## 2019-01-17 DIAGNOSIS — Z0001 Encounter for general adult medical examination with abnormal findings: Secondary | ICD-10-CM

## 2019-01-17 DIAGNOSIS — Z0000 Encounter for general adult medical examination without abnormal findings: Secondary | ICD-10-CM

## 2019-01-17 DIAGNOSIS — I10 Essential (primary) hypertension: Secondary | ICD-10-CM

## 2019-01-17 LAB — CBC WITH DIFFERENTIAL/PLATELET
Basophils Absolute: 0 10*3/uL (ref 0.0–0.1)
Basophils Relative: 0.5 % (ref 0.0–3.0)
Eosinophils Absolute: 0.2 10*3/uL (ref 0.0–0.7)
Eosinophils Relative: 2 % (ref 0.0–5.0)
HCT: 43.7 % (ref 39.0–52.0)
Hemoglobin: 15 g/dL (ref 13.0–17.0)
Lymphocytes Relative: 29.7 % (ref 12.0–46.0)
Lymphs Abs: 2.3 10*3/uL (ref 0.7–4.0)
MCHC: 34.2 g/dL (ref 30.0–36.0)
MCV: 91 fl (ref 78.0–100.0)
Monocytes Absolute: 0.6 10*3/uL (ref 0.1–1.0)
Monocytes Relative: 7.9 % (ref 3.0–12.0)
Neutro Abs: 4.7 10*3/uL (ref 1.4–7.7)
Neutrophils Relative %: 59.9 % (ref 43.0–77.0)
Platelets: 253 10*3/uL (ref 150.0–400.0)
RBC: 4.8 Mil/uL (ref 4.22–5.81)
RDW: 13.6 % (ref 11.5–15.5)
WBC: 7.8 10*3/uL (ref 4.0–10.5)

## 2019-01-17 LAB — URINALYSIS, ROUTINE W REFLEX MICROSCOPIC
Bilirubin Urine: NEGATIVE
Ketones, ur: NEGATIVE
Leukocytes,Ua: NEGATIVE
Nitrite: NEGATIVE
Specific Gravity, Urine: 1.015 (ref 1.000–1.030)
Total Protein, Urine: NEGATIVE
Urine Glucose: NEGATIVE
Urobilinogen, UA: 0.2 (ref 0.0–1.0)
pH: 6 (ref 5.0–8.0)

## 2019-01-17 LAB — LIPID PANEL
Cholesterol: 60 mg/dL (ref 0–200)
HDL: 29.4 mg/dL — ABNORMAL LOW (ref >39.00)
LDL Cholesterol: 18 mg/dL (ref 0–99)
NonHDL: 30.66
Total CHOL/HDL Ratio: 2
Triglycerides: 62 mg/dL (ref 0.0–149.0)
VLDL: 12.4 mg/dL (ref 0.0–40.0)

## 2019-01-17 LAB — HEPATIC FUNCTION PANEL
ALT: 17 U/L (ref 0–53)
AST: 19 U/L (ref 0–37)
Albumin: 4 g/dL (ref 3.5–5.2)
Alkaline Phosphatase: 72 U/L (ref 39–117)
Bilirubin, Direct: 0.2 mg/dL (ref 0.0–0.3)
Total Bilirubin: 0.5 mg/dL (ref 0.2–1.2)
Total Protein: 6.9 g/dL (ref 6.0–8.3)

## 2019-01-17 LAB — HEMOGLOBIN A1C: Hgb A1c MFr Bld: 5.6 % (ref 4.6–6.5)

## 2019-01-17 LAB — VITAMIN D 25 HYDROXY (VIT D DEFICIENCY, FRACTURES): VITD: 18.53 ng/mL — ABNORMAL LOW (ref 30.00–100.00)

## 2019-01-17 LAB — BASIC METABOLIC PANEL
BUN: 14 mg/dL (ref 6–23)
CO2: 25 mEq/L (ref 19–32)
Calcium: 8.6 mg/dL (ref 8.4–10.5)
Chloride: 107 mEq/L (ref 96–112)
Creatinine, Ser: 0.98 mg/dL (ref 0.40–1.50)
GFR: 77.8 mL/min (ref >60.00)
Glucose, Bld: 98 mg/dL (ref 70–99)
Potassium: 4.1 mEq/L (ref 3.5–5.1)
Sodium: 139 mEq/L (ref 135–145)

## 2019-01-17 LAB — IBC PANEL
Iron: 82 ug/dL (ref 42–165)
Saturation Ratios: 24.6 % (ref 20.0–50.0)
Transferrin: 238 mg/dL (ref 212.0–360.0)

## 2019-01-17 LAB — PSA: PSA: 1.15 ng/mL (ref 0.10–4.00)

## 2019-01-17 LAB — TSH: TSH: 2.38 u[IU]/mL (ref 0.35–4.50)

## 2019-01-17 LAB — VITAMIN B12: Vitamin B-12: 554 pg/mL (ref 211–911)

## 2019-01-17 MED ORDER — GABAPENTIN 100 MG PO CAPS: 100.0000 mg | ORAL_CAPSULE | Freq: Three times a day (TID) | ORAL | 5 refills | Status: DC

## 2019-01-17 MED ORDER — VARDENAFIL HCL 20 MG PO TABS: 20.0000 mg | ORAL_TABLET | Freq: Every day | ORAL | 5 refills | Status: DC | PRN

## 2019-01-17 NOTE — Patient Instructions (Addendum)
Please take all new medication as prescribed - the gabapentin at 100 mg three times per day  Please call in 1-2 wks for higher dose if not helping  Please continue all other medications as before, and refills have been done if requested - the levitra  Please have the pharmacy call with any other refills you may need.  Please continue your efforts at being more active, low cholesterol diet, and weight control.  You are otherwise up to date with prevention measures today.  Please keep your appointments with your specialists as you may have planned  You will be contacted regarding the referral for: Neurology  Please go to the LAB in the Basement (turn left off the elevator) for the tests to be done today  You will be contacted by phone if any changes need to be made immediately.  Otherwise, you will receive a letter about your results with an explanation, but please check with MyChart first.  Please remember to sign up for MyChart if you have not done so, as this will be important to you in the future with finding out test results, communicating by private email, and scheduling acute appointments online when needed.  Please return in 1 year for your yearly visit, or sooner if needed

## 2019-01-17 NOTE — Progress Notes (Signed)
Subjective:    Patient ID: Samuel Hawkins, male    DOB: 1956/03/19, 63 y.o.   MRN: [id number]  HPI  Here to f/u after last seen Jan 02, 2016; Here for wellness and re-establish;  Overall doing ok;  Pt denies Chest pain, worsening SOB, DOE, wheezing, orthopnea, PND, worsening LE edema, palpitations, dizziness or syncope.   Pt denies polydipsia, polyuria, or low sugar symptoms. Pt states overall good compliance with treatment and medications, good tolerability, and has been trying to follow appropriate diet.  Pt denies worsening depressive symptoms, suicidal ideation or panic. No fever, night sweats, wt loss, loss of appetite, or other constitutional symptoms.  Pt states good ability with ADL's, has low fall risk, home safety reviewed and adequate, no other significant changes in hearing or vision, and only occasionally active with exercise. Also, Pt denies new neurological symptoms such as new headache, or facial or extremity weakness but has intermittent paresthesias to the legs with mild discomfort at night.   Also c/o worsening ED symptoms for the past year, Denies urinary symptoms such as dysuria, frequency, urgency, flank pain, hematuria or n/v, fever, chills.  Asking for viagra like med. Past Medical History:  Diagnosis Date  . CAD (coronary artery disease) 04/2012   Dr. Einar Gip: EF 55%; dominant right with diffuse 80% mid vessel disease. Just prior to bifurcation is a 90% stenosis.  D1 90% & LAD 70% after D1 - FFR positive. Large D2 noted.  Proximal circumflex 90% at OM1, ostial OM1 80%. Subtotal OM 2 with TIMI 1 flow (large vessel with several branches).  Ostial ramus 80%.  . ERECTILE DYSFUNCTION 05/01/2008  . Glucose intolerance (impaired glucose tolerance)    history of  . History of erectile dysfunction   . Hyperlipidemia   . HYPERLIPIDEMIA 05/01/2008  . Hypertension   . Impaired glucose tolerance 07/13/2011  . 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   Past Surgical History:  Procedure Laterality Date  . CARDIAC CATHETERIZATION    . CORONARY ARTERY BYPASS GRAFT  05/04/2011   Procedure: CORONARY ARTERY BYPASS GRAFTING (CABG);  Surgeon: Melrose Nakayama, MD;  Location: [place];  Service: Open Heart Surgery;  Laterality: N/A;  CABG x  six;  using bilateral  internal mammary arteries and left leg greater saphenous vein harvested endoscopically --LIMA-LAD, RIMA-dRCA, SeqSVG-OM3-OM4  . CORONARY STENT INTERVENTION N/A 08/02/2018   Procedure: CORONARY STENT INTERVENTION;  Surgeon: Adrian Prows, MD;  Location: [facility];  Service: Cardiovascular;  Laterality: N/A;  . LEFT HEART CATH AND CORS/GRAFTS ANGIOGRAPHY N/A 08/02/2018   Procedure: LEFT HEART CATH AND CORS/GRAFTS ANGIOGRAPHY;  Surgeon: Adrian Prows, MD;  Location: [facility];  Service: Cardiovascular;  Laterality: N/A;  . LEFT HEART CATHETERIZATION WITH CORONARY ANGIOGRAM N/A 04/30/2011   Procedure: LEFT HEART CATHETERIZATION WITH CORONARY ANGIOGRAM;  Surgeon: Laverda Page, MD;  Location: [facility]: EF 55%; dominant right with diffuse 80% mid vessel disease. Just prior to bifurcation is a 90% stenosis.  D1 90% & LAD 70% after D1 - FFR positive. Large D2 noted.  Proximal circumflex 90% at OM1, ost OM1 80%. Subtotal OM 2 w/ TIMI 1 flow (large w/ several branches), Ost RI 80%  . NM MYOVIEW LTD  11/2012   Stress EKG showed 1.5 mm ST depression at peak exercise recovering less than 2 minutes into recovery. He exercised for 11:52 min. 12.9 METS. Small-moderate-sized inferior-inferolateral transmural scar without infarction. Mild inferior wall hypokinesis. EF 61%.  LOW RISK  . NM MYOVIEW LTD  08/26/2016   Exercise 11:41 min 12.7 MET S -peak heart rate 162 BPM (90% of max predicted). Normal blood pressure response = excellent exercise capacity..  EF 50%. Medium size, moderate intensity perfusion defect in the basal inferior-basal lateral and inferolateral wall consistent  with prior infarction. No evidence of reversibility/ischemia. (Similar to previous study)  . TRANSTHORACIC ECHOCARDIOGRAM  04/2001   EF 55-60%. Mild posterior-inferior hypokinesis. GR 1 DD.    reports that he quit smoking about 7 years ago. His smoking use included cigarettes. He has never used smokeless tobacco. He reports previous alcohol use. He reports that he does not use drugs. family history includes Heart attack in his brother; Stroke in his mother. Allergies  Allergen Reactions  . Atorvastatin Other (See Comments)    muscle cramp  . Simvastatin Other (See Comments)    muscle cramp   Current Outpatient Medications on File Prior to Visit  Medication Sig Dispense Refill  . aspirin 81 MG chewable tablet Chew 81 mg by mouth daily.    . carvedilol (COREG) 3.125 MG tablet Take 1 tablet (3.125 mg total) by mouth 2 (two) times daily. 180 tablet 1  . clopidogrel (PLAVIX) 75 MG tablet Take 1 tablet (75 mg total) by mouth daily. 90 tablet 3  . Evolocumab (REPATHA SURECLICK) 287 MG/ML SOAJ Inject 140 mg into the skin every 14 (fourteen) days. 6 pen 4  . ezetimibe (ZETIA) 10 MG tablet TAKE ONE TABLET BY MOUTH DAILY (Patient taking differently: Take 10 mg by mouth every evening. ) 90 tablet 2  . losartan (COZAAR) 50 MG tablet TAKE ONE TABLET BY MOUTH DAILY 90 tablet 2  . nitroGLYCERIN (NITROSTAT) 0.4 MG SL tablet Place 1 tablet (0.4 mg total) under the tongue every 5 (five) minutes as needed for chest pain. 15 tablet 1  . pantoprazole (PROTONIX) 40 MG tablet Take 1 tablet (40 mg total) by mouth daily before breakfast for 180 doses. As needed for heart burn 30 tablet 6   No current facility-administered medications on file prior to visit.    Review of Systems Constitutional: Negative for other unusual diaphoresis, sweats, appetite or weight changes HENT: Negative for other worsening hearing loss, ear pain, facial swelling, mouth sores or neck stiffness.   Eyes: Negative for other worsening  pain, redness or other visual disturbance.  Respiratory: Negative for other stridor or swelling Cardiovascular: Negative for other palpitations or other chest pain  Gastrointestinal: Negative for worsening diarrhea or loose stools, blood in stool, distention or other pain Genitourinary: Negative for hematuria, flank pain or other change in urine volume.  Musculoskeletal: Negative for myalgias or other joint swelling.  Skin: Negative for other color change, or other wound or worsening drainage.  Neurological: Negative for other syncope or numbness. Hematological: Negative for other adenopathy or swelling Psychiatric/Behavioral: Negative for hallucinations, other worsening agitation, SI, self-injury, or new decreased concentration All other system neg per pt    Objective:   Physical Exam BP 124/84   Pulse (!) 53   Temp 98.2 F (36.8 C) (Oral)   Ht '5\' 7"'  (1.702 m)   Wt 195 lb (88.5 kg)   SpO2 95%   BMI 30.54 kg/m  Constitutional: Negative for other unusual diaphoresis, sweats, appetite or weight changes HENT: Negative for other worsening hearing loss, ear pain, facial swelling, mouth sores or neck stiffness.   Eyes: Negative for other worsening pain, redness or other visual disturbance.  Respiratory: Negative for other stridor or  swelling Cardiovascular: Negative for other palpitations or other chest pain  Gastrointestinal: Negative for worsening diarrhea or loose stools, blood in stool, distention or other pain Genitourinary: Negative for hematuria, flank pain or other change in urine volume.  Musculoskeletal: Negative for myalgias or other joint swelling.  Skin: Negative for other color change, or other wound or worsening drainage.  Neurological: Negative for other syncope or numbness. Hematological: Negative for other adenopathy or swelling Psychiatric/Behavioral: Negative for hallucinations, other worsening agitation, SI, self-injury, or new decreased concentration No other exam  findings  Lab Results  Component Value Date   WBC 10.4 08/03/2018   HGB 14.3 08/03/2018   HCT 40.9 08/03/2018   PLT 287 08/03/2018   GLUCOSE 135 (H) 08/03/2018   CHOL 59 (L) 03/25/2018   TRIG 52 03/25/2018   HDL 30 (L) 03/25/2018   LDLDIRECT 176.2 07/10/2010   LDLCALC 19 03/25/2018   ALT 37 07/19/2017   AST 37 07/19/2017   NA 137 08/03/2018   K 4.1 08/03/2018   CL 105 08/03/2018   CREATININE 0.93 08/03/2018   BUN 9 08/03/2018   CO2 23 08/03/2018   TSH 2.52 09/17/2011   PSA 0.55 09/17/2011   INR 1.54 (H) 05/04/2011   HGBA1C 5.8 (H) 03/09/2016      Assessment & Plan:

## 2019-01-18 ENCOUNTER — Encounter: Payer: Self-pay | Admitting: Internal Medicine

## 2019-01-18 ENCOUNTER — Other Ambulatory Visit: Payer: Self-pay | Admitting: Internal Medicine

## 2019-01-18 LAB — HEPATITIS C ANTIBODY
Hepatitis C Ab: NONREACTIVE
SIGNAL TO CUT-OFF: 0.02 (ref <1.00)

## 2019-01-18 MED ORDER — VITAMIN D (ERGOCALCIFEROL) 1.25 MG (50000 UNIT) PO CAPS: 50000.0000 [IU] | ORAL_CAPSULE | ORAL | 0 refills | Status: DC

## 2019-01-19 ENCOUNTER — Telehealth: Payer: Self-pay

## 2019-01-19 NOTE — Telephone Encounter (Signed)
-----   Message from Biagio Borg, MD sent at 01/18/2019 12:59 PM EDT ----- Left message on MyChart, pt to cont same tx except  The test results show that your current treatment is OK, as the tests are stable, except the Vitamin D level is low.  Please take Vitamin D 50000 units weekly for 12 weeks, then plan to change to OTC Vitamin D3 at 2000 units per day, indefinitely.Redmond Baseman to please inform pt, I will do rx

## 2019-01-19 NOTE — Telephone Encounter (Signed)
Called pt, LVM.   CRM created.  

## 2019-01-22 ENCOUNTER — Encounter: Payer: Self-pay | Admitting: Internal Medicine

## 2019-01-22 NOTE — Assessment & Plan Note (Signed)
Ok for levitra prn,  to f/u any worsening symptoms or concerns 

## 2019-01-22 NOTE — Assessment & Plan Note (Addendum)
Etiology unclear, for b12 check, trial gabapentin, and refer neurology per pt request  In addition to the time spent performing CPE, I spent an additional 25 minutes face to face,in which greater than 50% of this time was spent in counseling and coordination of care for patient's acute illness as documented, including the differential dx, treatment, further evaluation and other management of paresthesias, HTN, hyperglycemia, ED

## 2019-01-22 NOTE — Assessment & Plan Note (Signed)
stable overall by history and exam, recent data reviewed with pt, and pt to continue medical treatment as before,  to f/u any worsening symptoms or concerns  

## 2019-01-22 NOTE — Assessment & Plan Note (Signed)

## 2019-01-22 NOTE — Addendum Note (Signed)
Addended by: Biagio Borg on: 01/22/2019 05:50 PM   Modules accepted: Level of Service

## 2019-01-30 ENCOUNTER — Other Ambulatory Visit: Payer: Self-pay | Admitting: Cardiology

## 2019-02-09 ENCOUNTER — Telehealth: Payer: Self-pay

## 2019-02-09 NOTE — Telephone Encounter (Signed)
Pt called and wants to know if he can take a airbourne per MP he can

## 2019-02-13 ENCOUNTER — Ambulatory Visit: Payer: Managed Care, Other (non HMO) | Admitting: Cardiology

## 2019-02-18 ENCOUNTER — Other Ambulatory Visit: Payer: Self-pay | Admitting: Cardiology

## 2019-03-03 ENCOUNTER — Ambulatory Visit: Payer: Managed Care, Other (non HMO) | Admitting: Neurology

## 2019-04-10 ENCOUNTER — Ambulatory Visit: Payer: Managed Care, Other (non HMO) | Admitting: Neurology

## 2019-04-14 ENCOUNTER — Other Ambulatory Visit: Payer: Self-pay | Admitting: Cardiology

## 2019-04-14 ENCOUNTER — Other Ambulatory Visit: Payer: Self-pay | Admitting: Internal Medicine

## 2019-04-30 ENCOUNTER — Other Ambulatory Visit: Payer: Self-pay | Admitting: Cardiology

## 2019-05-08 ENCOUNTER — Other Ambulatory Visit: Payer: Self-pay

## 2019-05-08 DIAGNOSIS — E7800 Pure hypercholesterolemia, unspecified: Secondary | ICD-10-CM

## 2019-05-08 DIAGNOSIS — I25810 Atherosclerosis of coronary artery bypass graft(s) without angina pectoris: Secondary | ICD-10-CM

## 2019-05-08 MED ORDER — REPATHA SURECLICK 140 MG/ML ~~LOC~~ SOAJ: 140.0000 mg | SUBCUTANEOUS | 4 refills | Status: DC

## 2019-05-14 ENCOUNTER — Other Ambulatory Visit: Payer: Self-pay | Admitting: Cardiology

## 2019-05-22 ENCOUNTER — Telehealth: Payer: Self-pay

## 2019-06-11 ENCOUNTER — Other Ambulatory Visit: Payer: Self-pay | Admitting: Cardiology

## 2019-06-14 ENCOUNTER — Other Ambulatory Visit: Payer: Self-pay | Admitting: Cardiology

## 2019-06-14 NOTE — Telephone Encounter (Signed)
error 

## 2019-06-16 ENCOUNTER — Telehealth: Payer: Self-pay

## 2019-06-16 NOTE — Telephone Encounter (Signed)
Email : PA response 06/16/2019

## 2019-07-12 ENCOUNTER — Other Ambulatory Visit: Payer: Self-pay | Admitting: Cardiology

## 2019-07-14 ENCOUNTER — Other Ambulatory Visit: Payer: Self-pay | Admitting: Cardiology

## 2019-07-24 ENCOUNTER — Other Ambulatory Visit: Payer: Self-pay | Admitting: Cardiology

## 2019-07-27 ENCOUNTER — Ambulatory Visit: Payer: Managed Care, Other (non HMO) | Attending: Internal Medicine

## 2019-07-27 DIAGNOSIS — Z23 Encounter for immunization: Secondary | ICD-10-CM

## 2019-07-27 NOTE — Progress Notes (Signed)
   Covid-19 Vaccination Clinic  Name:  Keelin Sheridan    MRN: [id number] DOB: [birth year]  07/27/2019  Mr. Silvera was observed post Covid-19 immunization for 15 minutes without incident. He was provided with Vaccine Information Sheet and instruction to access the V-Safe system.   Mr. Sobotka was instructed to call 911 with any severe reactions post vaccine: Marland Kitchen Difficulty breathing  . Swelling of face and throat  . A fast heartbeat  . A bad rash all over body  . Dizziness and weakness   Immunizations Administered    Name Date Dose VIS Date Route   Pfizer COVID-19 Vaccine 07/27/2019 12:25 PM 0.3 mL 04/28/2019 Intramuscular   Manufacturer: [employer], [employer]   Lot: [id number]   NDC: [address]-3

## 2019-08-03 ENCOUNTER — Other Ambulatory Visit: Payer: Self-pay | Admitting: Cardiology

## 2019-08-10 ENCOUNTER — Other Ambulatory Visit: Payer: Self-pay | Admitting: Cardiology

## 2019-08-14 ENCOUNTER — Other Ambulatory Visit: Payer: Self-pay | Admitting: Cardiology

## 2019-08-16 ENCOUNTER — Other Ambulatory Visit: Payer: Self-pay

## 2019-08-16 DIAGNOSIS — E7800 Pure hypercholesterolemia, unspecified: Secondary | ICD-10-CM

## 2019-08-16 DIAGNOSIS — I25810 Atherosclerosis of coronary artery bypass graft(s) without angina pectoris: Secondary | ICD-10-CM

## 2019-08-16 MED ORDER — EZETIMIBE 10 MG PO TABS: 10.0000 mg | ORAL_TABLET | Freq: Every day | ORAL | 0 refills | Status: DC

## 2019-08-16 MED ORDER — REPATHA SURECLICK 140 MG/ML ~~LOC~~ SOAJ: 140.0000 mg | SUBCUTANEOUS | 4 refills | Status: DC

## 2019-08-17 ENCOUNTER — Other Ambulatory Visit: Payer: Self-pay | Admitting: Cardiology

## 2019-08-21 ENCOUNTER — Ambulatory Visit: Payer: Managed Care, Other (non HMO) | Attending: Internal Medicine

## 2019-08-21 DIAGNOSIS — Z23 Encounter for immunization: Secondary | ICD-10-CM

## 2019-08-21 NOTE — Progress Notes (Signed)
   Covid-19 Vaccination Clinic  Name:  Samuel Hawkins    MRN: [id number] DOB: [birth year]  08/21/2019  Samuel Hawkins was observed post Covid-19 immunization for 15 minutes without incident. He was provided with Vaccine Information Sheet and instruction to access the V-Safe system.   Samuel Hawkins was instructed to call 911 with any severe reactions post vaccine: Marland Kitchen Difficulty breathing  . Swelling of face and throat  . A fast heartbeat  . A bad rash all over body  . Dizziness and weakness   Immunizations Administered    Name Date Dose VIS Date Route   Pfizer COVID-19 Vaccine 08/21/2019 10:21 AM 0.3 mL 04/28/2019 Intramuscular   Manufacturer: [employer], [employer]   Lot: [id number]   NDC: [address]-9

## 2019-09-07 ENCOUNTER — Ambulatory Visit: Payer: Managed Care, Other (non HMO) | Admitting: Cardiology

## 2019-09-09 ENCOUNTER — Other Ambulatory Visit: Payer: Self-pay | Admitting: Cardiology

## 2019-09-11 NOTE — Progress Notes (Signed)
Primary Physician/Referring:  Biagio Borg, MD  Patient ID: Samuel Hawkins, male    DOB: [birth year], 64 y.o.   MRN: [id number]  Chief Complaint  Patient presents with  . Coronary Artery Disease  . Follow-up    4 months   HPI:    Samuel Hawkins  is a 64 y.o. with coronary artery disease, history of MI in 2012, status post CABG (LIMA-LAD, RIMA-dRCA, other venous grafts occluded), with recent syncope sucpicious for ischemia etiology, and angiography revealed high-grade circumflex stenosis. He underwent sccessful complex PCI on 08/02/2018 by Dr. Einar Gip.(Synergy DES 2.25 X 12 mm to high OM1, Synergy DES 3.5 X 15 mm pLCx).   Patient is doing well since his PCI. No recurrence of syncope. He denies chest pain, shortness of breath, palpitations, leg edema, orthopnea, PND, TIA/syncope. He is compliant with his medical therapy.  He discontinued 20 mg of Crestor due to myalgias.  Presently on Zetia and Repatha.   Past Medical History:  Diagnosis Date  . CAD (coronary artery disease) 04/2012   Dr. Einar Gip: EF 55%; dominant right with diffuse 80% mid vessel disease. Just prior to bifurcation is a 90% stenosis.  D1 90% & LAD 70% after D1 - FFR positive. Large D2 noted.  Proximal circumflex 90% at OM1, ostial OM1 80%. Subtotal OM 2 with TIMI 1 flow (large vessel with several branches).  Ostial ramus 80%.  . ERECTILE DYSFUNCTION 05/01/2008  . Glucose intolerance (impaired glucose tolerance)    history of  . History of erectile dysfunction   . Hyperlipidemia   . HYPERLIPIDEMIA 05/01/2008  . Hypertension   . Impaired glucose tolerance 07/13/2011  . 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   Past Surgical History:  Procedure Laterality Date  . CARDIAC CATHETERIZATION    . CORONARY ARTERY BYPASS GRAFT  05/04/2011   Procedure: CORONARY ARTERY BYPASS GRAFTING (CABG);  Surgeon: Melrose Nakayama, MD;  Location: [facility];  Service: Open Heart Surgery;  Laterality:  N/A;  CABG x  six;  using bilateral  internal mammary arteries and left leg greater saphenous vein harvested endoscopically --LIMA-LAD, RIMA-dRCA, SeqSVG-OM3-OM4  . CORONARY STENT INTERVENTION N/A 08/02/2018   Procedure: CORONARY STENT INTERVENTION;  Surgeon: Adrian Prows, MD;  Location: [facility];  Service: Cardiovascular;  Laterality: N/A;  . LEFT HEART CATH AND CORS/GRAFTS ANGIOGRAPHY N/A 08/02/2018   Procedure: LEFT HEART CATH AND CORS/GRAFTS ANGIOGRAPHY;  Surgeon: Adrian Prows, MD;  Location: [facility];  Service: Cardiovascular;  Laterality: N/A;  . LEFT HEART CATHETERIZATION WITH CORONARY ANGIOGRAM N/A 04/30/2011   Procedure: LEFT HEART CATHETERIZATION WITH CORONARY ANGIOGRAM;  Surgeon: Laverda Page, MD;  Location: [facility]: EF 55%; dominant right with diffuse 80% mid vessel disease. Just prior to bifurcation is a 90% stenosis.  D1 90% & LAD 70% after D1 - FFR positive. Large D2 noted.  Proximal circumflex 90% at OM1, ost OM1 80%. Subtotal OM 2 w/ TIMI 1 flow (large w/ several branches), Ost RI 80%  . NM MYOVIEW LTD  11/2012   Stress EKG showed 1.5 mm ST depression at peak exercise recovering less than 2 minutes into recovery. He exercised for 11:52 min. 12.9 METS. Small-moderate-sized inferior-inferolateral transmural scar without infarction. Mild inferior wall hypokinesis. EF 61%. LOW RISK  . NM MYOVIEW LTD  08/26/2016   Exercise 11:41 min 12.7 MET S -peak heart rate 162 BPM (90% of max predicted). Normal blood pressure response = excellent exercise capacity.Marland Kitchen  EF  50%. Medium size, moderate intensity perfusion defect in the basal inferior-basal lateral and inferolateral wall consistent with prior infarction. No evidence of reversibility/ischemia. (Similar to previous study)  . TRANSTHORACIC ECHOCARDIOGRAM  04/2001   EF 55-60%. Mild posterior-inferior hypokinesis. GR 1 DD.   Family History  Problem Relation Age of Onset  . Heart attack Brother   . Stroke Mother       Social History   Tobacco Use  . Smoking status: Former Smoker    Types: Cigarettes    Quit date: 05/10/2011    Years since quitting: 8.3  . Smokeless tobacco: Never Used  . Tobacco comment: social smoker only prior to MI  Substance Use Topics  . Alcohol use: Not Currently   Marital Status: Divorced  ROS  Review of Systems  Constitution: Negative for chills, decreased appetite, malaise/fatigue and weight gain.  Cardiovascular: Negative for dyspnea on exertion, leg swelling and syncope.  Endocrine: Negative for cold intolerance.  Hematologic/Lymphatic: Does not bruise/bleed easily.  Musculoskeletal: Negative for joint swelling.  Gastrointestinal: Negative for abdominal pain, anorexia, change in bowel habit, hematochezia and melena.  Neurological: Negative for headaches and light-headedness.  Psychiatric/Behavioral: Negative for depression and substance abuse.   Objective  Blood pressure 130/78, pulse (!) 44, temperature 97.9 F (36.6 C), temperature source Temporal, resp. rate 16, height '5\' 7"'  (1.702 m), weight 200 lb 9.6 oz (91 kg), SpO2 96 %.  Vitals with BMI 09/14/2019 09/14/2019 01/17/2019  Height - '5\' 7"'  '5\' 7"'   Weight - 200 lbs 10 oz 195 lbs  BMI - 43.27 61.47  Systolic 092 957 473  Diastolic 78 82 84  Pulse 44 48 53     Physical Exam  Constitutional: He appears well-developed and well-nourished.  HENT:  Head: Atraumatic.  Eyes: Conjunctivae are normal.  Neck: No JVD present. No thyromegaly present.  Cardiovascular: Normal rate, regular rhythm and intact distal pulses. Exam reveals no gallop.  No murmur heard. Right groin with no hematoma, only mild ecchymosis.    Pulmonary/Chest: Effort normal and breath sounds normal. No accessory muscle usage. No respiratory distress.  Abdominal: Soft. Bowel sounds are normal.  Musculoskeletal:        General: Normal range of motion.     Cervical back: Neck supple.  Neurological: He is alert.  Skin: Skin is warm and dry.   Psychiatric: He has a normal mood and affect.   Laboratory examination:   Recent Labs    01/17/19 1349  NA 139  K 4.1  CL 107  CO2 25  GLUCOSE 98  BUN 14  CREATININE 0.98  CALCIUM 8.6   CrCl cannot be calculated (Patient's most recent lab result is older than the maximum 21 days allowed.).  CMP Latest Ref Rng & Units 01/17/2019 08/03/2018 07/19/2018  Glucose 70 - 99 mg/dL 98 135(H) 108(H)  BUN 6 - 23 mg/dL '14 9 10  ' Creatinine 0.40 - 1.50 mg/dL 0.98 0.93 1.14  Sodium 135 - 145 mEq/L 139 137 142  Potassium 3.5 - 5.1 mEq/L 4.1 4.1 4.3  Chloride 96 - 112 mEq/L 107 105 107(H)  CO2 19 - 32 mEq/L '25 23 22  ' Calcium 8.4 - 10.5 mg/dL 8.6 8.5(L) 8.8  Total Protein 6.0 - 8.3 g/dL 6.9 - -  Total Bilirubin 0.2 - 1.2 mg/dL 0.5 - -  Alkaline Phos 39 - 117 U/L 72 - -  AST 0 - 37 U/L 19 - -  ALT 0 - 53 U/L 17 - -   CBC Latest Ref  Rng & Units 01/17/2019 08/03/2018 07/19/2018  WBC 4.0 - 10.5 K/uL 7.8 10.4 9.6  Hemoglobin 13.0 - 17.0 g/dL 15.0 14.3 14.9  Hematocrit 39.0 - 52.0 % 43.7 40.9 44.2  Platelets 150.0 - 400.0 K/uL 253.0 287 309   Lipid Panel     Component Value Date/Time   CHOL 60 01/17/2019 1349   CHOL 59 (L) 03/25/2018 1222   TRIG 62.0 01/17/2019 1349   TRIG 240 (HH) 04/30/2006 0816   HDL 29.40 (L) 01/17/2019 1349   HDL 30 (L) 03/25/2018 1222   CHOLHDL 2 01/17/2019 1349   VLDL 12.4 01/17/2019 1349   LDLCALC 18 01/17/2019 1349   LDLCALC 19 03/25/2018 1222   LDLDIRECT 176.2 07/10/2010 1146   HEMOGLOBIN A1C Lab Results  Component Value Date   HGBA1C 5.6 01/17/2019   MPG 120 03/09/2016   TSH Recent Labs    01/17/19 1349  TSH 2.38   Medications and allergies   Allergies  Allergen Reactions  . Atorvastatin Other (See Comments)    muscle cramp  . Simvastatin Other (See Comments)    muscle cramp     Current Outpatient Medications  Medication Instructions  . aspirin 81 mg, Oral, Daily  . carvedilol (COREG) 3.125 MG tablet TAKE ONE TABLET BY MOUTH TWICE A DAY  .  ezetimibe (ZETIA) 10 MG tablet TAKE ONE TABLET BY MOUTH DAILY **PLEASE MAKE ANNUAL APPOINTMENT WITH DR.HARDING FOR REFILLS**  . losartan (COZAAR) 50 MG tablet TAKE ONE TABLET BY MOUTH DAILY  . nitroGLYCERIN (NITROSTAT) 0.4 mg, Sublingual, Every 5 min PRN  . pantoprazole (PROTONIX) 40 MG tablet TAKE ONE TABLET BY MOUTH DAILY BEFORE BREAKFAST AS NEEDED FOR HEART BURN  . Repatha SureClick 413 mg, Subcutaneous, Every 14 days  . rosuvastatin (CRESTOR) 10 mg, Oral, Daily  . vardenafil (LEVITRA) 20 mg, Oral, Daily PRN   Radiology:   No results found.  Cardiac Studies:   Coronary angiogram 08/02/2018:  PTCA and complex and prolonged procedure involving stenting of high OM1 with 2.25 x 12 mm Synergy DES, P stenting of the main circumflex proximal branch with 3.5 x 15 mm Synergy DES, stenosis reduced in the OM1 from 80% to 0% with TIMI-3 to TIMI-3 flow and in the main circumflex from 99% to 0% with TIMI-3 to TIMI-3 flow.  There was significant improvement noted through antegrade filling of a large OM 2 which is subtotally occluded and diffusely diseased and also distal PDA branches of codominant circumflex. Mid RCA is occluded.  RIMA to RCA patent. Left main is short.  LAD occluded in the midsegment.  D1 has mild disease in the ostium.  LIMA to LAD is widely patent. Occluded SVG to ramus intermediate and OM1 and SVG to D1.  EKG:  EKG 09/14/2019: Sinus bradycardia at rate of 48 bpm, otherwise normal EKG.  Compared to  08/10/2018  Nopnspecific ST-T changes inferior leads no longer present.  Assessment     ICD-10-CM   1. Coronary artery disease involving coronary bypass graft of native heart without angina pectoris  I25.810 EKG 12-Lead    rosuvastatin (CRESTOR) 10 MG tablet    CMP14+EGFR    TSH    Lipid Panel With LDL/HDL Ratio  2. Hyperlipidemia with target LDL less than 70  E78.5 rosuvastatin (CRESTOR) 10 MG tablet    Lipid Panel With LDL/HDL Ratio  3. Essential hypertension  I10      Meds  ordered this encounter  Medications  . rosuvastatin (CRESTOR) 10 MG tablet    Sig: Take  1 tablet (10 mg total) by mouth daily.    Dispense:  90 tablet    Refill:  3    Medications Discontinued During This Encounter  Medication Reason  . gabapentin (NEURONTIN) 100 MG capsule Patient Preference  . Vitamin D, Ergocalciferol, (DRISDOL) 1.25 MG (50000 UT) CAPS capsule Patient Preference  . clopidogrel (PLAVIX) 75 MG tablet Completed Course    Recommendations:   Samuel Hawkins  is a  64 y.o. with coronary artery disease, history of MI in 2012, status post CABG (LIMA-LAD, RIMA-dRCA, other venous grafts occluded), with recent syncope sucpicious for ischemia etiology, and angiography revealed high-grade circumflex stenosis. He underwent sccessful complex PCI on 08/02/2018 by Dr. Einar Gip.(Synergy DES 2.25 X 12 mm to high OM1, Synergy DES 3.5 X 15 mm pLCx). Patient is doing well since his PCI. No recurrence of syncope.  on Repatha for hyper-lipidemia along with high-dose Crestor and Zetia.  He had discontinued Crestor 20 mg tablets, due to myalgia which I restarted at 10 mg daily.  He needs labs, routine labs including CBC, CMP, lipid profile testing ordered today.  Otherwise blood pressures well controlled, on appropriate medical therapy.  As it has been greater than a year since last angioplasty, will discontinue Plavix and continue aspirin indefinitely.  Adrian Prows, MD, [facility] 09/14/2019, 10:54 AM [facility] [place] Office: [phone]

## 2019-09-14 ENCOUNTER — Ambulatory Visit: Payer: Managed Care, Other (non HMO) | Admitting: Cardiology

## 2019-09-14 ENCOUNTER — Other Ambulatory Visit: Payer: Self-pay

## 2019-09-14 ENCOUNTER — Encounter: Payer: Self-pay | Admitting: Cardiology

## 2019-09-14 VITALS — BP 130/78 | HR 44 | Temp 97.9°F | Resp 16 | Ht 67.0 in | Wt 200.6 lb

## 2019-09-14 DIAGNOSIS — I10 Essential (primary) hypertension: Secondary | ICD-10-CM

## 2019-09-14 DIAGNOSIS — I25810 Atherosclerosis of coronary artery bypass graft(s) without angina pectoris: Secondary | ICD-10-CM

## 2019-09-14 DIAGNOSIS — E785 Hyperlipidemia, unspecified: Secondary | ICD-10-CM

## 2019-09-14 MED ORDER — ROSUVASTATIN CALCIUM 10 MG PO TABS: 10.0000 mg | ORAL_TABLET | Freq: Every day | ORAL | 3 refills | Status: DC

## 2019-09-15 LAB — CMP14+EGFR
ALT: 23 IU/L (ref 0–44)
AST: 22 IU/L (ref 0–40)
Albumin/Globulin Ratio: 1.6 (ref 1.2–2.2)
Albumin: 4.2 g/dL (ref 3.8–4.8)
Alkaline Phosphatase: 71 IU/L (ref 39–117)
BUN/Creatinine Ratio: 19 (ref 10–24)
BUN: 19 mg/dL (ref 8–27)
Bilirubin Total: 0.5 mg/dL (ref 0.0–1.2)
CO2: 21 mmol/L (ref 20–29)
Calcium: 9.4 mg/dL (ref 8.6–10.2)
Chloride: 107 mmol/L — ABNORMAL HIGH (ref 96–106)
Creatinine, Ser: 0.98 mg/dL (ref 0.76–1.27)
GFR calc Af Amer: 96 mL/min/{1.73_m2} (ref >59)
GFR calc non Af Amer: 83 mL/min/{1.73_m2} (ref >59)
Globulin, Total: 2.7 g/dL (ref 1.5–4.5)
Glucose: 96 mg/dL (ref 65–99)
Potassium: 4.1 mmol/L (ref 3.5–5.2)
Sodium: 141 mmol/L (ref 134–144)
Total Protein: 6.9 g/dL (ref 6.0–8.5)

## 2019-09-15 LAB — LIPID PANEL WITH LDL/HDL RATIO
Cholesterol, Total: 72 mg/dL — ABNORMAL LOW (ref 100–199)
HDL: 30 mg/dL — ABNORMAL LOW (ref >39)
LDL Chol Calc (NIH): 26 mg/dL (ref 0–99)
LDL/HDL Ratio: 0.9 ratio (ref 0.0–3.6)
Triglycerides: 75 mg/dL (ref 0–149)
VLDL Cholesterol Cal: 16 mg/dL (ref 5–40)

## 2019-09-15 LAB — TSH: TSH: 2.87 u[IU]/mL (ref 0.450–4.500)

## 2019-09-15 NOTE — Progress Notes (Signed)
Patient is aware of recent labs.

## 2019-09-20 ENCOUNTER — Other Ambulatory Visit: Payer: Self-pay

## 2019-09-26 ENCOUNTER — Other Ambulatory Visit: Payer: Self-pay

## 2019-09-26 MED ORDER — EZETIMIBE 10 MG PO TABS: 10.0000 mg | ORAL_TABLET | Freq: Every day | ORAL | 5 refills | Status: DC

## 2019-10-02 ENCOUNTER — Telehealth: Payer: Self-pay

## 2019-10-02 NOTE — Telephone Encounter (Signed)
Pt has discontinued the Rosuvastatin due to cramping. Please advise.

## 2019-10-02 NOTE — Telephone Encounter (Signed)
Okay. On Repatha, continue the same and he can try taking Crestor twice weekly.

## 2019-10-11 ENCOUNTER — Other Ambulatory Visit: Payer: Self-pay | Admitting: Cardiology

## 2019-10-30 ENCOUNTER — Other Ambulatory Visit: Payer: Self-pay | Admitting: Cardiology

## 2019-11-06 ENCOUNTER — Telehealth: Payer: Self-pay

## 2019-11-06 NOTE — Telephone Encounter (Signed)
Message empty

## 2019-12-14 ENCOUNTER — Ambulatory Visit: Payer: Managed Care, Other (non HMO) | Admitting: Internal Medicine

## 2020-02-06 ENCOUNTER — Other Ambulatory Visit: Payer: Self-pay | Admitting: Internal Medicine

## 2020-02-11 ENCOUNTER — Other Ambulatory Visit: Payer: Self-pay | Admitting: Cardiology

## 2020-03-21 ENCOUNTER — Other Ambulatory Visit: Payer: Self-pay | Admitting: Cardiology

## 2020-03-27 ENCOUNTER — Other Ambulatory Visit: Payer: Self-pay

## 2020-03-27 MED ORDER — CARVEDILOL 3.125 MG PO TABS: 3.1250 mg | ORAL_TABLET | Freq: Two times a day (BID) | ORAL | 6 refills | Status: DC

## 2020-04-08 ENCOUNTER — Other Ambulatory Visit: Payer: Self-pay | Admitting: Cardiology

## 2020-06-11 ENCOUNTER — Encounter: Payer: Managed Care, Other (non HMO) | Admitting: Internal Medicine

## 2020-06-11 ENCOUNTER — Ambulatory Visit (INDEPENDENT_AMBULATORY_CARE_PROVIDER_SITE_OTHER): Payer: Managed Care, Other (non HMO) | Admitting: Internal Medicine

## 2020-06-11 ENCOUNTER — Encounter: Payer: Self-pay | Admitting: Internal Medicine

## 2020-06-11 ENCOUNTER — Other Ambulatory Visit: Payer: Self-pay

## 2020-06-11 VITALS — BP 118/68 | HR 54 | Temp 98.3°F | Ht 67.0 in | Wt 197.0 lb

## 2020-06-11 DIAGNOSIS — R7302 Impaired glucose tolerance (oral): Secondary | ICD-10-CM | POA: Diagnosis not present

## 2020-06-11 DIAGNOSIS — Z0001 Encounter for general adult medical examination with abnormal findings: Secondary | ICD-10-CM

## 2020-06-11 DIAGNOSIS — F32A Depression, unspecified: Secondary | ICD-10-CM

## 2020-06-11 DIAGNOSIS — E538 Deficiency of other specified B group vitamins: Secondary | ICD-10-CM

## 2020-06-11 DIAGNOSIS — R0600 Dyspnea, unspecified: Secondary | ICD-10-CM

## 2020-06-11 DIAGNOSIS — J309 Allergic rhinitis, unspecified: Secondary | ICD-10-CM | POA: Diagnosis not present

## 2020-06-11 DIAGNOSIS — E559 Vitamin D deficiency, unspecified: Secondary | ICD-10-CM | POA: Diagnosis not present

## 2020-06-11 DIAGNOSIS — E785 Hyperlipidemia, unspecified: Secondary | ICD-10-CM | POA: Diagnosis not present

## 2020-06-11 DIAGNOSIS — I10 Essential (primary) hypertension: Secondary | ICD-10-CM

## 2020-06-11 LAB — CBC WITH DIFFERENTIAL/PLATELET
Basophils Absolute: 0 10*3/uL (ref 0.0–0.1)
Basophils Relative: 0.5 % (ref 0.0–3.0)
Eosinophils Absolute: 0.2 10*3/uL (ref 0.0–0.7)
Eosinophils Relative: 2 % (ref 0.0–5.0)
HCT: 45.3 % (ref 39.0–52.0)
Hemoglobin: 15.6 g/dL (ref 13.0–17.0)
Lymphocytes Relative: 30.4 % (ref 12.0–46.0)
Lymphs Abs: 2.4 10*3/uL (ref 0.7–4.0)
MCHC: 34.4 g/dL (ref 30.0–36.0)
MCV: 89.8 fl (ref 78.0–100.0)
Monocytes Absolute: 0.7 10*3/uL (ref 0.1–1.0)
Monocytes Relative: 9.5 % (ref 3.0–12.0)
Neutro Abs: 4.5 10*3/uL (ref 1.4–7.7)
Neutrophils Relative %: 57.6 % (ref 43.0–77.0)
Platelets: 280 10*3/uL (ref 150.0–400.0)
RBC: 5.04 Mil/uL (ref 4.22–5.81)
RDW: 13.2 % (ref 11.5–15.5)
WBC: 7.8 10*3/uL (ref 4.0–10.5)

## 2020-06-11 LAB — HEPATIC FUNCTION PANEL
ALT: 24 U/L (ref 0–53)
AST: 26 U/L (ref 0–37)
Albumin: 4.3 g/dL (ref 3.5–5.2)
Alkaline Phosphatase: 69 U/L (ref 39–117)
Bilirubin, Direct: 0.2 mg/dL (ref 0.0–0.3)
Total Bilirubin: 0.7 mg/dL (ref 0.2–1.2)
Total Protein: 7.1 g/dL (ref 6.0–8.3)

## 2020-06-11 LAB — LIPID PANEL
Cholesterol: 69 mg/dL (ref 0–200)
HDL: 28.9 mg/dL — ABNORMAL LOW (ref >39.00)
LDL Cholesterol: 20 mg/dL (ref 0–99)
NonHDL: 39.78
Total CHOL/HDL Ratio: 2
Triglycerides: 101 mg/dL (ref 0.0–149.0)
VLDL: 20.2 mg/dL (ref 0.0–40.0)

## 2020-06-11 LAB — VITAMIN D 25 HYDROXY (VIT D DEFICIENCY, FRACTURES): VITD: 25.17 ng/mL — ABNORMAL LOW (ref 30.00–100.00)

## 2020-06-11 LAB — TSH: TSH: 2.76 u[IU]/mL (ref 0.35–4.50)

## 2020-06-11 LAB — PSA: PSA: 1.5 ng/mL (ref 0.10–4.00)

## 2020-06-11 LAB — VITAMIN B12: Vitamin B-12: 544 pg/mL (ref 211–911)

## 2020-06-11 LAB — HEMOGLOBIN A1C: Hgb A1c MFr Bld: 5.8 % (ref 4.6–6.5)

## 2020-06-11 MED ORDER — ALBUTEROL SULFATE HFA 108 (90 BASE) MCG/ACT IN AERS
2.0000 | INHALATION_SPRAY | Freq: Four times a day (QID) | RESPIRATORY_TRACT | 5 refills | Status: DC | PRN
Start: 2020-06-11 — End: 2021-08-19

## 2020-06-11 NOTE — Progress Notes (Signed)
Established Patient Office Visit  Subjective:  Patient ID: Samuel Hawkins, male    DOB: [birth year]  Age: 65 y.o. MRN: [id number]       Chief Complaint:: wellness exam and dyspnea, allergies, depression       HPI:  Samuel Hawkins is a 65 y.o. male here for wellness exam here with wife;  Wt Readings from Last 3 Encounters:  06/11/20 197 lb (89.4 kg)  09/14/19 200 lb 9.6 oz (91 kg)  01/17/19 195 lb (88.5 kg)   BP Readings from Last 3 Encounters:  06/11/20 118/68  09/14/19 130/78  01/17/19 124/84   Immunization History  Administered Date(s) Administered  . Influenza, Quadrivalent, Recombinant, Inj, Pf 02/12/2019  . Influenza-Unspecified 03/01/2020  . PFIZER(Purple Top)SARS-COV-2 Vaccination 07/27/2019, 08/21/2019, 02/22/2020  . Td 07/17/2010  . Zoster Recombinat (Shingrix) 02/18/2019  There are no preventive care reminders to display for this patient.       Also wife c/o pt persistently intemittent mild worsening sob recently in past few months, pt wasn't going to bring it up, has occasional non prod cough as well, without overt wheezing, fever, chills, ST, but also Pt denies chest pain, wheezing, orthopnea, PND, increased LE swelling, palpitations, dizziness or syncope.  Has long hx of allergies with worsening symptoms congestion in the past few months as well.  Also, pt state has had mild worsening depressive symptoms, but no suicidal ideation, or panic, not really affecting his personal or work life, and does not necessarily think he needs tx or counseling for now.    Past Medical History:  Diagnosis Date  . CAD (coronary artery disease) 04/2012   Dr. Einar Gip: EF 55%; dominant right with diffuse 80% mid vessel disease. Just prior to bifurcation is a 90% stenosis.  D1 90% & LAD 70% after D1 - FFR positive. Large D2 noted.  Proximal circumflex 90% at OM1, ostial OM1 80%. Subtotal OM 2 with TIMI 1 flow (large vessel with several branches).  Ostial ramus 80%.  . ERECTILE DYSFUNCTION 05/01/2008   . Glucose intolerance (impaired glucose tolerance)    history of  . History of erectile dysfunction   . Hyperlipidemia   . HYPERLIPIDEMIA 05/01/2008  . Hypertension   . Impaired glucose tolerance 07/13/2011  . 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   Past Surgical History:  Procedure Laterality Date  . CARDIAC CATHETERIZATION    . CORONARY ARTERY BYPASS GRAFT  05/04/2011   Procedure: CORONARY ARTERY BYPASS GRAFTING (CABG);  Surgeon: Melrose Nakayama, MD;  Location: [facility];  Service: Open Heart Surgery;  Laterality: N/A;  CABG x  six;  using bilateral  internal mammary arteries and left leg greater saphenous vein harvested endoscopically --LIMA-LAD, RIMA-dRCA, SeqSVG-OM3-OM4  . CORONARY STENT INTERVENTION N/A 08/02/2018   Procedure: CORONARY STENT INTERVENTION;  Surgeon: Adrian Prows, MD;  Location: [facility];  Service: Cardiovascular;  Laterality: N/A;  . LEFT HEART CATH AND CORS/GRAFTS ANGIOGRAPHY N/A 08/02/2018   Procedure: LEFT HEART CATH AND CORS/GRAFTS ANGIOGRAPHY;  Surgeon: Adrian Prows, MD;  Location: [facility];  Service: Cardiovascular;  Laterality: N/A;  . LEFT HEART CATHETERIZATION WITH CORONARY ANGIOGRAM N/A 04/30/2011   Procedure: LEFT HEART CATHETERIZATION WITH CORONARY ANGIOGRAM;  Surgeon: Laverda Page, MD;  Location: [facility]: EF 55%; dominant right with diffuse 80% mid vessel disease. Just prior to bifurcation is a 90% stenosis.  D1 90% & LAD 70% after D1 - FFR positive. Large D2 noted.  Proximal circumflex  90% at OM1, ost OM1 80%. Subtotal OM 2 w/ TIMI 1 flow (large w/ several branches), Ost RI 80%  . NM MYOVIEW LTD  11/2012   Stress EKG showed 1.5 mm ST depression at peak exercise recovering less than 2 minutes into recovery. He exercised for 11:52 min. 12.9 METS. Small-moderate-sized inferior-inferolateral transmural scar without infarction. Mild inferior wall hypokinesis. EF 61%. LOW RISK  . NM  MYOVIEW LTD  08/26/2016   Exercise 11:41 min 12.7 MET S -peak heart rate 162 BPM (90% of max predicted). Normal blood pressure response = excellent exercise capacity..  EF 50%. Medium size, moderate intensity perfusion defect in the basal inferior-basal lateral and inferolateral wall consistent with prior infarction. No evidence of reversibility/ischemia. (Similar to previous study)  . TRANSTHORACIC ECHOCARDIOGRAM  04/2001   EF 55-60%. Mild posterior-inferior hypokinesis. GR 1 DD.    reports that he quit smoking about 9 years ago. His smoking use included cigarettes. He has never used smokeless tobacco. He reports previous alcohol use. He reports that he does not use drugs. family history includes Heart attack in his brother; Stroke in his mother. Allergies  Allergen Reactions  . Atorvastatin Other (See Comments)    muscle cramp  . Simvastatin Other (See Comments)    muscle cramp   Current Outpatient Medications on File Prior to Visit  Medication Sig Dispense Refill  . aspirin 81 MG chewable tablet Chew 81 mg by mouth daily.    . carvedilol (COREG) 3.125 MG tablet Take 1 tablet (3.125 mg total) by mouth 2 (two) times daily. 60 tablet 6  . clopidogrel (PLAVIX) 75 MG tablet TAKE ONE TABLET BY MOUTH DAILY 90 tablet 2  . Evolocumab (REPATHA SURECLICK) 681 MG/ML SOAJ Inject 140 mg into the skin every 14 (fourteen) days. 6 pen 4  . ezetimibe (ZETIA) 10 MG tablet TAKE ONE TABLET BY MOUTH DAILY 90 tablet 1  . losartan (COZAAR) 50 MG tablet TAKE ONE TABLET BY MOUTH DAILY 90 tablet 2  . nitroGLYCERIN (NITROSTAT) 0.4 MG SL tablet Place 1 tablet (0.4 mg total) under the tongue every 5 (five) minutes as needed for chest pain. 15 tablet 1  . pantoprazole (PROTONIX) 40 MG tablet TAKE ONE TABLET BY MOUTH DAILY BEFORE BREAKFAST AS NEEDED FOR HEART BURN 30 tablet 11  . vardenafil (LEVITRA) 20 MG tablet TAKE ONE TABLET BY MOUTH DAILY AS NEEDED FOR ERECTILE DYSFUNCTION 10 tablet 5   No current  facility-administered medications on file prior to visit.        ROS:  All others reviewed and negative.  Objective        PE:  BP 118/68   Pulse (!) 54   Temp 98.3 F (36.8 C) (Temporal)   Ht _0  (1.702 m)   Wt 197 lb (89.4 kg)   SpO2 96%   BMI 30.85 kg/m                 Constitutional: Pt appears in NAD               HENT: Head: NCAT.                Right Ear: External ear normal.                 Left Ear: External ear normal.                Eyes: . Pupils are equal, round, and reactive to light. Conjunctivae and EOM are normal  Nose: without d/c or deformity               Neck: Neck supple. Gross normal ROM               Cardiovascular: Normal rate and regular rhythm.                 Pulmonary/Chest: Effort normal and breath sounds without rales or wheezing.                Abd:  Soft, NT, ND, + BS, no organomegaly               Neurological: Pt is alert. At baseline orientation, motor grossly intact               Skin: Skin is warm. No rashes, no other new lesions, LE edema - none               Psychiatric: Pt behavior is normal without agitation   Assessment/Plan:  Eduard Penkala is a 65 y.o. Other or two or more races [6] male with  has a past medical history of CAD (coronary artery disease) (04/2012), ERECTILE DYSFUNCTION (05/01/2008), Glucose intolerance (impaired glucose tolerance), History of erectile dysfunction, Hyperlipidemia, HYPERLIPIDEMIA (05/01/2008), Hypertension, Impaired glucose tolerance (07/13/2011), and S/P CABG (coronary artery bypass graft) (07/13/2011).  Micro: none  Cardiac tracings I have personally interpreted today:  none  Pertinent Radiological findings (summarize): none   Lab Results  Component Value Date   WBC 7.8 06/11/2020   HGB 15.6 06/11/2020   HCT 45.3 06/11/2020   PLT 280.0 06/11/2020   GLUCOSE 96 09/14/2019   CHOL 69 06/11/2020   TRIG 101.0 06/11/2020   HDL 28.90 (L) 06/11/2020   LDLDIRECT 176.2 07/10/2010   LDLCALC  20 06/11/2020   ALT 24 06/11/2020   AST 26 06/11/2020   NA 141 09/14/2019   K 4.1 09/14/2019   CL 107 (H) 09/14/2019   CREATININE 0.98 09/14/2019   BUN 19 09/14/2019   CO2 21 09/14/2019   TSH 2.76 06/11/2020   PSA 1.50 06/11/2020   INR 1.54 (H) 05/04/2011   HGBA1C 5.8 06/11/2020     Assessment & Plan:   Problem List Items Addressed This Visit      High   Encounter for well adult exam with abnormal findings - Primary    Age and sex appropriate education and counseling updated with regular exercise and diet Referrals for preventative services - none needed Immunizations addressed - none needed Smoking counseling  - none needed Evidence for depression or other mood disorder - see below Most recent labs reviewed. I have personally reviewed and have noted: 1) the patient's medical and social history 2) The patient's current medications and supplements 3) The patient's height, weight, and BMI have been recorded in the chart       Relevant Orders   PSA (Completed)   Dyspnea    I suspect mild intemittent asthma likely allergic related as per allergic rhinitis, for albut HFA prn, for other tx such as steroid controller if persistent or worsening        Medium   Impaired glucose tolerance    Lab Results  Component Value Date   HGBA1C 5.8 06/11/2020   Stable, pt to continue current medical treatment  - diet       Relevant Orders   Hemoglobin A1c (Completed)   Hyperlipidemia with target LDL less than 70 (Chronic)    Lab Results  Component Value Date  [place] 20 06/11/2020   Stable, pt to continue current zetia and repatha   Current Outpatient Medications (Cardiovascular):  .  carvedilol (COREG) 3.125 MG tablet, Take 1 tablet (3.125 mg total) by mouth 2 (two) times daily. .  Evolocumab (REPATHA SURECLICK) 250 MG/ML SOAJ, Inject 140 mg into the skin every 14 (fourteen) days. Marland Kitchen  ezetimibe (ZETIA) 10 MG tablet, TAKE ONE TABLET BY MOUTH DAILY .  losartan (COZAAR) 50  MG tablet, TAKE ONE TABLET BY MOUTH DAILY .  nitroGLYCERIN (NITROSTAT) 0.4 MG SL tablet, Place 1 tablet (0.4 mg total) under the tongue every 5 (five) minutes as needed for chest pain. .  vardenafil (LEVITRA) 20 MG tablet, TAKE ONE TABLET BY MOUTH DAILY AS NEEDED FOR ERECTILE DYSFUNCTION  Current Outpatient Medications (Respiratory):  .  albuterol (VENTOLIN HFA) 108 (90 Base) MCG/ACT inhaler, Inhale 2 puffs into the lungs every 6 (six) hours as needed for wheezing or shortness of breath.  Current Outpatient Medications (Analgesics):  .  aspirin 81 MG chewable tablet, Chew 81 mg by mouth daily.  Current Outpatient Medications (Hematological):  .  clopidogrel (PLAVIX) 75 MG tablet, TAKE ONE TABLET BY MOUTH DAILY  Current Outpatient Medications (Other):  .  pantoprazole (PROTONIX) 40 MG tablet, TAKE ONE TABLET BY MOUTH DAILY BEFORE BREAKFAST AS NEEDED FOR HEART BURN       Relevant Orders   Lipid panel (Completed)   Hepatic function panel (Completed)   CBC with Differential/Platelet (Completed)   TSH (Completed)   Urinalysis, Routine w reflex microscopic (Completed)   Essential hypertension (Chronic)    BP Readings from Last 3 Encounters:  06/11/20 118/68  09/14/19 130/78  01/17/19 124/84   Stable, pt to continue medical treatment coreg, cozaar       Depression    Mild per pt, declines med tx or referral for counseling or psychiatry      Allergic rhinitis    Mild to mod persistent this past fall - for otc allegra and nasacort asd prn,  to f/u any worsening symptoms or concerns       Other Visit Diagnoses    B12 deficiency       Relevant Orders   Vitamin B12 (Completed)   Vitamin D deficiency       Relevant Orders   VITAMIN D 25 Hydroxy (Vit-D Deficiency, Fractures) (Completed)      Meds ordered this encounter  Medications  . albuterol (VENTOLIN HFA) 108 (90 Base) MCG/ACT inhaler    Sig: Inhale 2 puffs into the lungs every 6 (six) hours as needed for wheezing or  shortness of breath.    Dispense:  8 g    Refill:  5    Follow-up: Return in about 6 months (around 12/09/2020).   Cathlean Cower, MD 06/11/2020 4:10 PM [facility]

## 2020-06-11 NOTE — Patient Instructions (Signed)
Please take all new medication as prescribed - the albuterol HFA inhaler as needed for shortness of breath  Please continue all other medications as before, and refills have been done if requested.  Please have the pharmacy call with any other refills you may need.  Please continue your efforts at being more active, low cholesterol diet, and weight control.  You are otherwise up to date with prevention measures today.  Please keep your appointments with your specialists as you may have planned  Please go to the LAB at the blood drawing area for the tests to be done  You will be contacted by phone if any changes need to be made immediately.  Otherwise, you will receive a letter about your results with an explanation, but please check with MyChart first.  Please remember to sign up for MyChart if you have not done so, as this will be important to you in the future with finding out test results, communicating by private email, and scheduling acute appointments online when needed.  Please make an Appointment to return in 6 months, or sooner if needed

## 2020-06-12 ENCOUNTER — Encounter: Payer: Self-pay | Admitting: Internal Medicine

## 2020-06-12 LAB — URINALYSIS, ROUTINE W REFLEX MICROSCOPIC
Bilirubin Urine: NEGATIVE
Ketones, ur: NEGATIVE
Leukocytes,Ua: NEGATIVE
Nitrite: NEGATIVE
Specific Gravity, Urine: 1.025 (ref 1.000–1.030)
Total Protein, Urine: NEGATIVE
Urine Glucose: NEGATIVE
Urobilinogen, UA: 0.2 (ref 0.0–1.0)
WBC, UA: NONE SEEN (ref 0–?)
pH: 6 (ref 5.0–8.0)

## 2020-06-16 ENCOUNTER — Encounter: Payer: Self-pay | Admitting: Internal Medicine

## 2020-06-16 DIAGNOSIS — F32A Depression, unspecified: Secondary | ICD-10-CM | POA: Insufficient documentation

## 2020-06-16 DIAGNOSIS — R0600 Dyspnea, unspecified: Secondary | ICD-10-CM | POA: Insufficient documentation

## 2020-06-16 DIAGNOSIS — J309 Allergic rhinitis, unspecified: Secondary | ICD-10-CM | POA: Insufficient documentation

## 2020-06-16 NOTE — Assessment & Plan Note (Signed)
Mild to mod persistent this past fall - for otc allegra and nasacort asd prn,  to f/u any worsening symptoms or concerns

## 2020-06-16 NOTE — Assessment & Plan Note (Signed)
Mild per pt, declines med tx or referral for counseling or psychiatry

## 2020-06-16 NOTE — Assessment & Plan Note (Signed)
Lab Results  Component Value Date   LDLCALC 20 06/11/2020   Stable, pt to continue current zetia and repatha   Current Outpatient Medications (Cardiovascular):  .  carvedilol (COREG) 3.125 MG tablet, Take 1 tablet (3.125 mg total) by mouth 2 (two) times daily. .  Evolocumab (REPATHA SURECLICK) 140 MG/ML SOAJ, Inject 140 mg into the skin every 14 (fourteen) days. Marland Kitchen  ezetimibe (ZETIA) 10 MG tablet, TAKE ONE TABLET BY MOUTH DAILY .  losartan (COZAAR) 50 MG tablet, TAKE ONE TABLET BY MOUTH DAILY .  nitroGLYCERIN (NITROSTAT) 0.4 MG SL tablet, Place 1 tablet (0.4 mg total) under the tongue every 5 (five) minutes as needed for chest pain. .  vardenafil (LEVITRA) 20 MG tablet, TAKE ONE TABLET BY MOUTH DAILY AS NEEDED FOR ERECTILE DYSFUNCTION  Current Outpatient Medications (Respiratory):  .  albuterol (VENTOLIN HFA) 108 (90 Base) MCG/ACT inhaler, Inhale 2 puffs into the lungs every 6 (six) hours as needed for wheezing or shortness of breath.  Current Outpatient Medications (Analgesics):  .  aspirin 81 MG chewable tablet, Chew 81 mg by mouth daily.  Current Outpatient Medications (Hematological):  .  clopidogrel (PLAVIX) 75 MG tablet, TAKE ONE TABLET BY MOUTH DAILY  Current Outpatient Medications (Other):  .  pantoprazole (PROTONIX) 40 MG tablet, TAKE ONE TABLET BY MOUTH DAILY BEFORE BREAKFAST AS NEEDED FOR HEART BURN

## 2020-06-16 NOTE — Assessment & Plan Note (Signed)
Lab Results  Component Value Date   HGBA1C 5.8 06/11/2020   Stable, pt to continue current medical treatment  - diet

## 2020-06-16 NOTE — Assessment & Plan Note (Signed)
I suspect mild intemittent asthma likely allergic related as per allergic rhinitis, for albut HFA prn, for other tx such as steroid controller if persistent or worsening

## 2020-06-16 NOTE — Assessment & Plan Note (Signed)
Age and sex appropriate education and counseling updated with regular exercise and diet Referrals for preventative services - none needed Immunizations addressed - none needed Smoking counseling  - none needed Evidence for depression or other mood disorder - see below Most recent labs reviewed. I have personally reviewed and have noted: 1) the patient's medical and social history 2) The patient's current medications and supplements 3) The patient's height, weight, and BMI have been recorded in the chart

## 2020-06-16 NOTE — Assessment & Plan Note (Addendum)
BP Readings from Last 3 Encounters:  06/11/20 118/68  09/14/19 130/78  01/17/19 124/84   Stable, pt to continue medical treatment coreg, cozaar

## 2020-08-08 ENCOUNTER — Other Ambulatory Visit: Payer: Self-pay | Admitting: Cardiology

## 2020-08-08 DIAGNOSIS — I25810 Atherosclerosis of coronary artery bypass graft(s) without angina pectoris: Secondary | ICD-10-CM

## 2020-08-08 DIAGNOSIS — E7800 Pure hypercholesterolemia, unspecified: Secondary | ICD-10-CM

## 2020-08-13 ENCOUNTER — Telehealth: Payer: Self-pay | Admitting: Internal Medicine

## 2020-08-13 NOTE — Telephone Encounter (Signed)
I really dont have a good reason to think he could not do his civic duty as he was doing relatively well last visit

## 2020-08-13 NOTE — Telephone Encounter (Signed)
Patient wondering if we can write a letter for him to get out of jury duty. He was summoned for 05.02.22

## 2020-08-14 NOTE — Telephone Encounter (Signed)
Patient phone constantly rung

## 2020-08-15 ENCOUNTER — Other Ambulatory Visit: Payer: Self-pay

## 2020-08-15 DIAGNOSIS — E7800 Pure hypercholesterolemia, unspecified: Secondary | ICD-10-CM

## 2020-08-15 DIAGNOSIS — I25810 Atherosclerosis of coronary artery bypass graft(s) without angina pectoris: Secondary | ICD-10-CM

## 2020-08-15 MED ORDER — REPATHA SURECLICK 140 MG/ML ~~LOC~~ SOAJ: SUBCUTANEOUS | 1 refills | Status: DC

## 2020-08-19 ENCOUNTER — Other Ambulatory Visit: Payer: Self-pay | Admitting: Cardiology

## 2020-08-19 ENCOUNTER — Other Ambulatory Visit: Payer: Self-pay

## 2020-08-19 DIAGNOSIS — I25810 Atherosclerosis of coronary artery bypass graft(s) without angina pectoris: Secondary | ICD-10-CM

## 2020-08-19 DIAGNOSIS — E7800 Pure hypercholesterolemia, unspecified: Secondary | ICD-10-CM

## 2020-08-19 MED ORDER — REPATHA SURECLICK 140 MG/ML ~~LOC~~ SOAJ: SUBCUTANEOUS | 1 refills | Status: DC

## 2020-09-13 ENCOUNTER — Ambulatory Visit: Payer: Managed Care, Other (non HMO) | Admitting: Cardiology

## 2020-09-28 ENCOUNTER — Other Ambulatory Visit: Payer: Self-pay | Admitting: Cardiology

## 2020-11-04 ENCOUNTER — Other Ambulatory Visit: Payer: Self-pay | Admitting: Cardiology

## 2020-11-04 ENCOUNTER — Other Ambulatory Visit: Payer: Self-pay

## 2020-11-05 ENCOUNTER — Other Ambulatory Visit: Payer: Self-pay

## 2020-11-05 MED ORDER — EZETIMIBE 10 MG PO TABS
10.0000 mg | ORAL_TABLET | Freq: Every day | ORAL | 0 refills | Status: DC
Start: 2020-11-05 — End: 2021-04-23

## 2020-11-06 ENCOUNTER — Other Ambulatory Visit: Payer: Self-pay

## 2020-11-06 MED ORDER — CARVEDILOL 3.125 MG PO TABS: 3.1250 mg | ORAL_TABLET | Freq: Two times a day (BID) | ORAL | 6 refills | Status: DC

## 2020-12-12 ENCOUNTER — Encounter: Payer: Self-pay | Admitting: Cardiology

## 2020-12-12 ENCOUNTER — Telehealth: Payer: Self-pay | Admitting: Student

## 2020-12-12 ENCOUNTER — Other Ambulatory Visit: Payer: Self-pay

## 2020-12-12 ENCOUNTER — Ambulatory Visit: Payer: Managed Care, Other (non HMO) | Admitting: Cardiology

## 2020-12-12 VITALS — BP 131/78 | HR 50 | Temp 98.2°F | Resp 17 | Ht 67.0 in | Wt 197.0 lb

## 2020-12-12 DIAGNOSIS — I25810 Atherosclerosis of coronary artery bypass graft(s) without angina pectoris: Secondary | ICD-10-CM

## 2020-12-12 DIAGNOSIS — I10 Essential (primary) hypertension: Secondary | ICD-10-CM

## 2020-12-12 DIAGNOSIS — E785 Hyperlipidemia, unspecified: Secondary | ICD-10-CM

## 2020-12-12 MED ORDER — ROSUVASTATIN CALCIUM 10 MG PO TABS: ORAL_TABLET | ORAL | 3 refills | Status: DC

## 2020-12-12 NOTE — Progress Notes (Signed)
Primary Physician/Referring:  Biagio Borg, MD  Patient ID: Samuel Hawkins, male    DOB: 1955-06-02, 65 y.o.   MRN: [id number]  Chief Complaint  Patient presents with   Coronary Artery Disease   Follow-up   HPI:    Samuel Hawkins  is a 65 y.o. with coronary artery disease, history of MI in 2012, status post CABG (LIMA-LAD, RIMA-dRCA, other venous grafts occluded). Repeat angiography revealed high-grade circumflex stenosis. He underwent sccessful complex PCI on 08/02/2018 by Dr. Einar Gip.(Synergy DES 2.25 X 12 mm to high OM1, Synergy DES 3.5 X 15 mm pLCx).   Patient presents for annual follow-up.  At last visit discontinued Plavix as it has been greater than 1 year since PCI.  Patient states he is doing well overall although he is relatively inactive without formal exercise routine.  He denies chest pain, shortness of breath, palpitations, leg edema, orthopnea, PND, syncope, near syncope.  Patient had inadvertently discontinued Crestor instead of taking 10 mg twice weekly as directed.  Notably does have history of myalgias with statin therapy.  Past Medical History:  Diagnosis Date   CAD (coronary artery disease) 04/2012   Dr. Einar Gip: EF 55%; dominant right with diffuse 80% mid vessel disease. Just prior to bifurcation is a 90% stenosis.  D1 90% & LAD 70% after D1 - FFR positive. Large D2 noted.  Proximal circumflex 90% at OM1, ostial OM1 80%. Subtotal OM 2 with TIMI 1 flow (large vessel with several branches).  Ostial ramus 80%.   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   Past Surgical History:  Procedure Laterality Date   CARDIAC CATHETERIZATION     CORONARY ARTERY BYPASS GRAFT  05/04/2011   Procedure:  CORONARY ARTERY BYPASS GRAFTING (CABG);  Surgeon: Melrose Nakayama, MD;  Location: [place];  Service: Open Heart Surgery;  Laterality: N/A;  CABG x  six;  using bilateral  internal mammary arteries and left leg greater saphenous vein harvested endoscopically --LIMA-LAD, RIMA-dRCA, SeqSVG-OM3-OM4   CORONARY STENT INTERVENTION N/A 08/02/2018   Procedure: CORONARY STENT INTERVENTION;  Surgeon: Adrian Prows, MD;  Location: [place] [facility];  Service: Cardiovascular;  Laterality: N/A;   LEFT HEART CATH AND CORS/GRAFTS ANGIOGRAPHY N/A 08/02/2018   Procedure: LEFT HEART CATH AND CORS/GRAFTS ANGIOGRAPHY;  Surgeon: Adrian Prows, MD;  Location: [place] [facility];  Service: Cardiovascular;  Laterality: N/A;   LEFT HEART CATHETERIZATION WITH CORONARY ANGIOGRAM N/A 04/30/2011   Procedure: LEFT HEART CATHETERIZATION WITH CORONARY ANGIOGRAM;  Surgeon: Laverda Page, MD;  Location: [place] [facility]: EF 55%; dominant right with diffuse 80% mid vessel disease. Just prior to bifurcation is a 90% stenosis.  D1 90% & LAD 70% after D1 - FFR positive. Large D2 noted.  Proximal circumflex 90% at OM1, ost OM1 80%. Subtotal OM 2 w/ TIMI 1 flow (large w/ several branches), Ost RI 80%   NM MYOVIEW LTD  11/2012   Stress EKG showed 1.5 mm ST depression at peak exercise recovering less than 2 minutes into recovery. He exercised for 11:52 min. 12.9 METS. Small-moderate-sized inferior-inferolateral transmural scar without infarction. Mild inferior wall hypokinesis. EF 61%. LOW RISK   NM MYOVIEW LTD  08/26/2016   Exercise 11:41 min 12.7 MET S -  peak heart rate 162 BPM (90% of max predicted). Normal blood pressure response = excellent exercise capacity..  EF 50%. Medium size, moderate intensity perfusion defect in the basal inferior-basal lateral and inferolateral wall consistent with prior infarction. No evidence of reversibility/ischemia. (Similar to previous study)   TRANSTHORACIC ECHOCARDIOGRAM  04/2001   EF 55-60%. Mild  posterior-inferior hypokinesis. GR 1 DD.   Family History  Problem Relation Age of Onset   Heart attack Brother    Stroke Mother     Social History   Tobacco Use   Smoking status: Former    Types: Cigarettes    Quit date: 05/10/2011    Years since quitting: 9.6   Smokeless tobacco: Never   Tobacco comments:    social smoker only prior to MI  Substance Use Topics   Alcohol use: Not Currently   Marital Status: Divorced  ROS  Review of Systems  Cardiovascular:  Negative for chest pain, claudication, leg swelling, near-syncope, orthopnea, palpitations, paroxysmal nocturnal dyspnea and syncope.  Respiratory:  Negative for shortness of breath.   Neurological:  Negative for dizziness.  Objective  Blood pressure 131/78, pulse (!) 50, temperature 98.2 F (36.8 C), temperature source Temporal, resp. rate 17, height '5\' 7"'  (1.702 m), weight 197 lb (89.4 kg), SpO2 97 %.  Vitals with BMI 12/12/2020 06/11/2020 09/14/2019  Height '5\' 7"'  '5\' 7"'  -  Weight 197 lbs 197 lbs -  BMI 74.16 38.45 -  Systolic 364 680 321  Diastolic 78 68 78  Pulse 50 54 44     Physical Exam Vitals reviewed.  Constitutional:      Appearance: He is well-developed.  Neck:     Thyroid: No thyromegaly.     Vascular: No carotid bruit or JVD.  Cardiovascular:     Rate and Rhythm: Normal rate and regular rhythm.     Pulses: Intact distal pulses.     Heart sounds: No murmur heard.   No gallop.     Comments:   Pulmonary:     Effort: Pulmonary effort is normal. No accessory muscle usage or respiratory distress.     Breath sounds: Normal breath sounds.  Musculoskeletal:     Right lower leg: No edema.     Left lower leg: No edema.  Skin:    General: Skin is warm and dry.  Neurological:     Mental Status: He is alert.   Laboratory examination:   No results for input(s): NA, K, CL, CO2, GLUCOSE, BUN, CREATININE, CALCIUM, GFRNONAA, GFRAA in the last 8760 hours.  CrCl cannot be calculated (Patient's most recent lab  result is older than the maximum 21 days allowed.).  CMP Latest Ref Rng & Units 06/11/2020 09/14/2019 01/17/2019  Glucose 65 - 99 mg/dL - 96 98  BUN 8 - 27 mg/dL - 19 14  Creatinine 0.76 - 1.27 mg/dL - 0.98 0.98  Sodium 134 - 144 mmol/L - 141 139  Potassium 3.5 - 5.2 mmol/L - 4.1 4.1  Chloride 96 - 106 mmol/L - 107(H) 107  CO2 20 - 29 mmol/L - 21 25  Calcium 8.6 - 10.2 mg/dL - 9.4 8.6  Total Protein 6.0 - 8.3 g/dL 7.1 6.9 6.9  Total Bilirubin 0.2 - 1.2 mg/dL 0.7 0.5 0.5  Alkaline Phos 39 - 117 U/L 69 71 72  AST 0 - 37 U/L '26 22 19  ' ALT 0 - 53 U/L '24 23 17   ' CBC Latest Ref Rng & Units 06/11/2020 01/17/2019 08/03/2018  WBC 4.0 - 10.5  K/uL 7.8 7.8 10.4  Hemoglobin 13.0 - 17.0 g/dL 15.6 15.0 14.3  Hematocrit 39.0 - 52.0 % 45.3 43.7 40.9  Platelets 150.0 - 400.0 K/uL 280.0 253.0 287   Lipid Panel     Component Value Date/Time   CHOL 69 06/11/2020 1614   CHOL 72 (L) 09/14/2019 1132   TRIG 101.0 06/11/2020 1614   TRIG 240 (HH) 04/30/2006 0816   HDL 28.90 (L) 06/11/2020 1614   HDL 30 (L) 09/14/2019 1132   CHOLHDL 2 06/11/2020 1614   VLDL 20.2 06/11/2020 1614   LDLCALC 20 06/11/2020 1614   LDLCALC 26 09/14/2019 1132   LDLDIRECT 176.2 07/10/2010 1146   HEMOGLOBIN A1C Lab Results  Component Value Date   HGBA1C 5.8 06/11/2020   MPG 120 03/09/2016   TSH Recent Labs    06/11/20 1614  TSH 2.76   Allergies   Allergies  Allergen Reactions   Atorvastatin Other (See Comments)    muscle cramp   Simvastatin Other (See Comments)    muscle cramp    Medications Prior to Visit:   Outpatient Medications Prior to Visit  Medication Sig Dispense Refill   albuterol (VENTOLIN HFA) 108 (90 Base) MCG/ACT inhaler Inhale 2 puffs into the lungs every 6 (six) hours as needed for wheezing or shortness of breath. 8 g 5   aspirin 81 MG chewable tablet Chew 81 mg by mouth daily.     carvedilol (COREG) 3.125 MG tablet Take 1 tablet (3.125 mg total) by mouth 2 (two) times daily. 60 tablet 6    Evolocumab (REPATHA SURECLICK) 397 MG/ML SOAJ INJECT 140MG INTO THE SKIN EVERY 14 DAYS 6 mL 1   ezetimibe (ZETIA) 10 MG tablet Take 1 tablet (10 mg total) by mouth daily. 90 tablet 0   losartan (COZAAR) 50 MG tablet TAKE ONE TABLET BY MOUTH DAILY 90 tablet 2   nitroGLYCERIN (NITROSTAT) 0.4 MG SL tablet Place 1 tablet (0.4 mg total) under the tongue every 5 (five) minutes as needed for chest pain. 15 tablet 1   pantoprazole (PROTONIX) 40 MG tablet TAKE ONE TABLET BY MOUTH DAILY BEFORE BREAKFAST AS NEEDED FOR HEART BURN 90 tablet 0   vardenafil (LEVITRA) 20 MG tablet TAKE ONE TABLET BY MOUTH DAILY AS NEEDED FOR ERECTILE DYSFUNCTION 10 tablet 5   clopidogrel (PLAVIX) 75 MG tablet TAKE ONE TABLET BY MOUTH DAILY 90 tablet 2   rosuvastatin (CRESTOR) 10 MG tablet Take 10 mg by mouth daily.     Ezetimibe-Simvastatin (VYTORIN PO)      No facility-administered medications prior to visit.   Final Medications at End of Visit    Current Meds  Medication Sig   albuterol (VENTOLIN HFA) 108 (90 Base) MCG/ACT inhaler Inhale 2 puffs into the lungs every 6 (six) hours as needed for wheezing or shortness of breath.   aspirin 81 MG chewable tablet Chew 81 mg by mouth daily.   carvedilol (COREG) 3.125 MG tablet Take 1 tablet (3.125 mg total) by mouth 2 (two) times daily.   Evolocumab (REPATHA SURECLICK) 673 MG/ML SOAJ INJECT 140MG INTO THE SKIN EVERY 14 DAYS   ezetimibe (ZETIA) 10 MG tablet Take 1 tablet (10 mg total) by mouth daily.   losartan (COZAAR) 50 MG tablet TAKE ONE TABLET BY MOUTH DAILY   nitroGLYCERIN (NITROSTAT) 0.4 MG SL tablet Place 1 tablet (0.4 mg total) under the tongue every 5 (five) minutes as needed for chest pain.   pantoprazole (PROTONIX) 40 MG tablet TAKE ONE TABLET BY MOUTH DAILY BEFORE BREAKFAST  AS NEEDED FOR HEART BURN   vardenafil (LEVITRA) 20 MG tablet TAKE ONE TABLET BY MOUTH DAILY AS NEEDED FOR ERECTILE DYSFUNCTION   [DISCONTINUED] clopidogrel (PLAVIX) 75 MG tablet TAKE ONE TABLET BY  MOUTH DAILY   [DISCONTINUED] rosuvastatin (CRESTOR) 10 MG tablet Take 10 mg by mouth daily.   Radiology:   No results found.  Cardiac Studies:   Coronary angiogram 08/02/2018:  PTCA and complex and prolonged procedure involving stenting of high OM1 with 2.25 x 12 mm Synergy DES, P stenting of the main circumflex proximal branch with 3.5 x 15 mm Synergy DES, stenosis reduced in the OM1 from 80% to 0% with TIMI-3 to TIMI-3 flow and in the main circumflex from 99% to 0% with TIMI-3 to TIMI-3 flow.  There was significant improvement noted through antegrade filling of a large OM 2 which is subtotally occluded and diffusely diseased and also distal PDA branches of codominant circumflex. Mid RCA is occluded.  RIMA to RCA patent. Left main is short.  LAD occluded in the midsegment.  D1 has mild disease in the ostium.  LIMA to LAD is widely patent. Occluded SVG to ramus intermediate and OM1 and SVG to D1.  EKG  12/12/2020: Marked sinus bradycardia rate of 48 bpm.  Normal axis.  Nonspecific T wave abnormality.  Compared to EKG 09/14/2019, no significant change.  EKG 09/14/2019: Sinus bradycardia at rate of 48 bpm, otherwise normal EKG.  Compared to  08/10/2018  Nopnspecific ST-T changes inferior leads no longer present.  Assessment     ICD-10-CM   1. Coronary artery disease involving coronary bypass graft of native heart without angina pectoris  I25.810 EKG 12-Lead    Lipid Panel With LDL/HDL Ratio    2. Hyperlipidemia with target LDL less than 70  E78.5 Lipid Panel With LDL/HDL Ratio    3. Essential hypertension  B70 Basic metabolic panel    CBC      Meds ordered this encounter  Medications   rosuvastatin (CRESTOR) 10 MG tablet    Sig: Take one tablet (10 mg) Mondays and Thursday at bedtime.    Dispense:  10 tablet    Refill:  3    Medications Discontinued During This Encounter  Medication Reason   Ezetimibe-Simvastatin (VYTORIN PO) Error   clopidogrel (PLAVIX) 75 MG tablet  Discontinued by provider   rosuvastatin (CRESTOR) 10 MG tablet Reorder    Recommendations:   Bethel Gaglio  is a  65 y.o. with coronary artery disease, history of MI in 2012, status post CABG (LIMA-LAD, RIMA-dRCA, other venous grafts occluded), with recent syncope sucpicious for ischemia etiology, and angiography revealed high-grade circumflex stenosis. He underwent sccessful complex PCI on 08/02/2018 by Dr. Einar Gip.(Synergy DES 2.25 X 12 mm to high OM1, Synergy DES 3.5 X 15 mm pLCx).   Patient is doing well overall without specific complaints.  He is relatively inactive, therefore counseled him regarding diet and lifestyle modifications in order to lose weight and reduce cardiovascular risk.  Patient's lipids are under excellent control at last check, however since then patient has inadvertently discontinued Crestor 10 mg twice weekly as recommended by Dr. Einar Gip, he will therefore resume this.  We will repeat lipid profile testing as well as CBC and BMP.  Blood pressure is well controlled.  Will continue present medications including aspirin, carvedilol, Repatha, Zetia, losartan.  Follow-up in 1 year, sooner if needed, for CAD and hyperlipidemia.   Alethia Berthold, PA-C 12/12/2020, 2:57 PM Office: [phone]

## 2020-12-12 NOTE — Telephone Encounter (Signed)
Called and spoke to pt

## 2020-12-12 NOTE — Telephone Encounter (Signed)
Pt is calling with questions about a medication that was giving today. Requesting a call back.

## 2020-12-13 LAB — LIPID PANEL WITH LDL/HDL RATIO
Cholesterol, Total: 81 mg/dL — ABNORMAL LOW (ref 100–199)
HDL: 32 mg/dL — ABNORMAL LOW (ref >39)
LDL Chol Calc (NIH): 34 mg/dL (ref 0–99)
LDL/HDL Ratio: 1.1 ratio (ref 0.0–3.6)
Triglycerides: 68 mg/dL (ref 0–149)
VLDL Cholesterol Cal: 15 mg/dL (ref 5–40)

## 2020-12-13 LAB — BASIC METABOLIC PANEL
BUN/Creatinine Ratio: 15 (ref 10–24)
BUN: 17 mg/dL (ref 8–27)
CO2: 22 mmol/L (ref 20–29)
Calcium: 9.3 mg/dL (ref 8.6–10.2)
Chloride: 106 mmol/L (ref 96–106)
Creatinine, Ser: 1.12 mg/dL (ref 0.76–1.27)
Glucose: 92 mg/dL (ref 65–99)
Potassium: 4.8 mmol/L (ref 3.5–5.2)
Sodium: 141 mmol/L (ref 134–144)
eGFR: 74 mL/min/{1.73_m2} (ref >59)

## 2020-12-13 LAB — CBC
Hematocrit: 47.2 % (ref 37.5–51.0)
Hemoglobin: 16.3 g/dL (ref 13.0–17.7)
MCH: 30.6 pg (ref 26.6–33.0)
MCHC: 34.5 g/dL (ref 31.5–35.7)
MCV: 89 fL (ref 79–97)
Platelets: 306 10*3/uL (ref 150–450)
RBC: 5.33 x10E6/uL (ref 4.14–5.80)
RDW: 12.8 % (ref 11.6–15.4)
WBC: 7.9 10*3/uL (ref 3.4–10.8)

## 2020-12-13 NOTE — Progress Notes (Signed)
I called and left patient voicemail to call office back. His cholesterol looks good without statin therapy. He can stop rosuvastatin.

## 2020-12-13 NOTE — Progress Notes (Signed)
Pt is aware.  

## 2020-12-13 NOTE — Progress Notes (Signed)
BMP and CBC normal.

## 2020-12-13 NOTE — Progress Notes (Signed)
Will you please try calling him again later today

## 2020-12-29 ENCOUNTER — Other Ambulatory Visit: Payer: Self-pay | Admitting: Cardiology

## 2021-02-19 ENCOUNTER — Emergency Department (HOSPITAL_COMMUNITY): Payer: Managed Care, Other (non HMO)

## 2021-02-19 ENCOUNTER — Other Ambulatory Visit: Payer: Self-pay

## 2021-02-19 ENCOUNTER — Encounter (HOSPITAL_COMMUNITY): Payer: Self-pay

## 2021-02-19 ENCOUNTER — Emergency Department (HOSPITAL_COMMUNITY)
Admission: EM | Admit: 2021-02-19 | Discharge: 2021-02-19 | Disposition: A | Discharge status: Home / Self Care | Payer: Managed Care, Other (non HMO) | Attending: Emergency Medicine | Admitting: Emergency Medicine

## 2021-02-19 DIAGNOSIS — Z951 Presence of aortocoronary bypass graft: Secondary | ICD-10-CM | POA: Insufficient documentation

## 2021-02-19 DIAGNOSIS — Z87891 Personal history of nicotine dependence: Secondary | ICD-10-CM | POA: Insufficient documentation

## 2021-02-19 DIAGNOSIS — I10 Essential (primary) hypertension: Secondary | ICD-10-CM | POA: Diagnosis not present

## 2021-02-19 DIAGNOSIS — I2510 Atherosclerotic heart disease of native coronary artery without angina pectoris: Secondary | ICD-10-CM | POA: Insufficient documentation

## 2021-02-19 DIAGNOSIS — R0789 Other chest pain: Secondary | ICD-10-CM | POA: Diagnosis present

## 2021-02-19 DIAGNOSIS — R079 Chest pain, unspecified: Secondary | ICD-10-CM

## 2021-02-19 LAB — BASIC METABOLIC PANEL
Anion gap: 9 (ref 5–15)
BUN: 34 mg/dL — ABNORMAL HIGH (ref 8–23)
CO2: 19 mmol/L — ABNORMAL LOW (ref 22–32)
Calcium: 9.8 mg/dL (ref 8.9–10.3)
Chloride: 105 mmol/L (ref 98–111)
Creatinine, Ser: 1.67 mg/dL — ABNORMAL HIGH (ref 0.61–1.24)
GFR, Estimated: 46 mL/min — ABNORMAL LOW (ref >60)
Glucose, Bld: 123 mg/dL — ABNORMAL HIGH (ref 70–99)
Potassium: 4.6 mmol/L (ref 3.5–5.1)
Sodium: 133 mmol/L — ABNORMAL LOW (ref 135–145)

## 2021-02-19 LAB — CBC
HCT: 53 % — ABNORMAL HIGH (ref 39.0–52.0)
Hemoglobin: 18 g/dL — ABNORMAL HIGH (ref 13.0–17.0)
MCH: 30.9 pg (ref 26.0–34.0)
MCHC: 34 g/dL (ref 30.0–36.0)
MCV: 90.9 fL (ref 80.0–100.0)
Platelets: 323 10*3/uL (ref 150–400)
RBC: 5.83 MIL/uL — ABNORMAL HIGH (ref 4.22–5.81)
RDW: 12.9 % (ref 11.5–15.5)
WBC: 15.8 10*3/uL — ABNORMAL HIGH (ref 4.0–10.5)
nRBC: 0 % (ref 0.0–0.2)

## 2021-02-19 LAB — TROPONIN I (HIGH SENSITIVITY): Troponin I (High Sensitivity): 9 ng/L (ref <18)

## 2021-02-19 MED ORDER — ALUM & MAG HYDROXIDE-SIMETH 200-200-20 MG/5ML PO SUSP
30.0000 mL | Freq: Once | ORAL | Status: AC
  Administered 2021-02-19: 30 mL via ORAL
  Filled 2021-02-19: qty 30

## 2021-02-19 MED ORDER — LIDOCAINE VISCOUS HCL 2 % MT SOLN
15.0000 mL | Freq: Once | OROMUCOSAL | Status: AC
  Administered 2021-02-19: 15 mL via ORAL
  Filled 2021-02-19: qty 15

## 2021-02-19 NOTE — Discharge Instructions (Addendum)
You were evaluated in the Emergency Department and after careful evaluation, we did not find any emergent condition requiring admission or further testing in the hospital.  Your exam/testing today is overall reassuring.  No signs of heart damage today and your blood work.  Recommend over-the-counter Maalox to help with your symptoms.  Please return to the Emergency Department if you experience any worsening of your condition.   Thank you for allowing [place] to be a part of your care.

## 2021-02-19 NOTE — ED Provider Notes (Signed)
[facility] Emergency Department Provider Note MRN:  [id number]  Arrival date & time: 02/19/21     Chief Complaint   Heartburn   History of Present Illness   Samuel Hawkins is a 65 y.o. year-old male with a history of CAD, GERD presenting to the ED with chief complaint of heartburn.  Location: Throat and upper chest Duration: 2 days Onset: Gradual Timing: Constant Description: Burning Severity: Moderate Exacerbating/Alleviating Factors: Worse with minimal Associated Symptoms: None Pertinent Negatives: No dizziness or diaphoresis, no nausea vomiting, no shortness of breath  Additional History: Feels similar to prior heartburn episodes.  Review of Systems  A complete 10 system review of systems was obtained and all systems are negative except as noted in the HPI and PMH.   Patient's Health History    Past Medical History:  Diagnosis Date   CAD (coronary artery disease) 04/2012   Dr. Einar Gip: EF 55%; dominant right with diffuse 80% mid vessel disease. Just prior to bifurcation is a 90% stenosis.  D1 90% & LAD 70% after D1 - FFR positive. Large D2 noted.  Proximal circumflex 90% at OM1, ostial OM1 80%. Subtotal OM 2 with TIMI 1 flow (large vessel with several branches).  Ostial ramus 80%.   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    Past Surgical History:  Procedure Laterality Date   CARDIAC CATHETERIZATION     CORONARY ARTERY BYPASS GRAFT  05/04/2011   Procedure: CORONARY ARTERY BYPASS GRAFTING (CABG);  Surgeon: Melrose Nakayama, MD;  Location: [place];  Service: Open Heart Surgery;  Laterality: N/A;  CABG x  six;  using bilateral  internal mammary arteries and left leg  greater saphenous vein harvested endoscopically --LIMA-LAD, RIMA-dRCA, SeqSVG-OM3-OM4   CORONARY STENT INTERVENTION N/A 08/02/2018   Procedure: CORONARY STENT INTERVENTION;  Surgeon: Adrian Prows, MD;  Location: [place] [facility];  Service: Cardiovascular;  Laterality: N/A;   LEFT HEART CATH AND CORS/GRAFTS ANGIOGRAPHY N/A 08/02/2018   Procedure: LEFT HEART CATH AND CORS/GRAFTS ANGIOGRAPHY;  Surgeon: Adrian Prows, MD;  Location: [place] [facility];  Service: Cardiovascular;  Laterality: N/A;   LEFT HEART CATHETERIZATION WITH CORONARY ANGIOGRAM N/A 04/30/2011   Procedure: LEFT HEART CATHETERIZATION WITH CORONARY ANGIOGRAM;  Surgeon: Laverda Page, MD;  Location: [place] C[facility]: EF 55%; dominant right with diffuse 80% mid vessel disease. Just prior to bifurcation is a 90% stenosis.  D1 90% & LAD 70% after D1 - FFR positive. Large D2 noted.  Proximal circumflex 90% at OM1, ost OM1 80%. Subtotal OM 2 w/ TIMI 1 flow (large w/ several branches), Ost RI 80%   NM MYOVIEW LTD  11/2012   Stress EKG showed 1.5 mm ST depression at peak exercise recovering less than 2 minutes into recovery. He exercised for 11:52 min. 12.9 METS. Small-moderate-sized inferior-inferolateral transmural scar without infarction. Mild inferior wall hypokinesis. EF 61%. LOW RISK   NM MYOVIEW LTD  08/26/2016   Exercise 11:41 min 12.7 MET S -peak heart rate 162 BPM (90% of max predicted). Normal blood pressure response = excellent exercise capacity..  EF 50%. Medium size, moderate intensity perfusion defect in the basal inferior-basal lateral and inferolateral wall consistent with prior infarction. No evidence of reversibility/ischemia. (Similar to  previous study)   TRANSTHORACIC ECHOCARDIOGRAM  04/2001   EF 55-60%. Mild posterior-inferior hypokinesis. GR 1 DD.    Family History  Problem Relation Age of Onset   Heart attack Brother    Stroke Mother     Social History   Socioeconomic History   Marital status: Divorced    Spouse name:  Not on file   Number of children: 1   Years of education: Not on file   Highest education level: Not on file  Occupational History   Occupation: [employer]    Employer: [employer]  Tobacco Use   Smoking status: Former    Types: Cigarettes    Quit date: 05/10/2011    Years since quitting: 9.7   Smokeless tobacco: Never   Tobacco comments:    social smoker only prior to MI  Vaping Use   Vaping Use: Never used  Substance and Sexual Activity   Alcohol use: Not Currently   Drug use: No   Sexual activity: Never  Other Topics Concern   Not on file  Social History Narrative   Not on file   Social Determinants of Health   Financial Resource Strain: Not on file  Food Insecurity: Not on file  Transportation Needs: Not on file  Physical Activity: Not on file  Stress: Not on file  Social Connections: Not on file  Intimate Partner Violence: Not on file     Physical Exam   Vitals:   02/19/21 0445 02/19/21 0500  BP: (!) 127/111 90/61  Pulse: 69 (!) 58  Resp: 20 19  Temp:    SpO2: 96% 99%    CONSTITUTIONAL: Well-appearing, NAD NEURO:  Alert and oriented x 3, no focal deficits EYES:  eyes equal and reactive ENT/NECK:  no LAD, no JVD CARDIO: Regular rate, well-perfused, normal S1 and S2 PULM:  CTAB no wheezing or rhonchi GI/GU:  normal bowel sounds, non-distended, non-tender MSK/SPINE:  No gross deformities, no edema SKIN:  no rash, atraumatic PSYCH:  Appropriate speech and behavior  *Additional and/or pertinent findings included in MDM below  Diagnostic and Interventional Summary    EKG Interpretation  Date/Time:  Wednesday February 19 2021 04:40:47 EDT Ventricular Rate:  67 PR Interval:  155 QRS Duration: 112 QT Interval:  394 QTC Calculation: 416 R Axis:   15 Text Interpretation: Sinus rhythm Abnormal R-wave progression, early transition Inferior infarct, old Abnormal lateral Q waves Confirmed by Gerlene Fee [phone]) on 02/19/2021 4:46:40 AM       Labs  Reviewed  CBC - Abnormal; Notable for the following components:      Result Value   WBC 15.8 (*)    RBC 5.83 (*)    Hemoglobin 18.0 (*)    HCT 53.0 (*)    All other components within normal limits  BASIC METABOLIC PANEL - Abnormal; Notable for the following components:   Sodium 133 (*)    CO2 19 (*)    Glucose, Bld 123 (*)    BUN 34 (*)    Creatinine, Ser 1.67 (*)    GFR, Estimated 46 (*)    All other components within normal limits  TROPONIN I (HIGH SENSITIVITY)    DG Chest 2 View  Final Result      Medications  alum & mag hydroxide-simeth (MAALOX/MYLANTA) 200-200-20 MG/5ML suspension 30 mL (30 mLs Oral Given 02/19/21 0443)    And  lidocaine (XYLOCAINE) 2 % viscous mouth solution 15 mL (15 mLs Oral Given 02/19/21 0443)     Procedures  /  Critical Care Procedures  ED Course and Medical Decision Making  I have reviewed the triage vital signs, the nursing notes, and pertinent available records from the EMR.  Listed above are laboratory and imaging tests that I personally ordered, reviewed, and interpreted and then considered in my medical decision making (see below for details).  Consistent with acid reflux, similar presentation in the past.  Given patient's cardiac history will obtain screening EKG, if any changes would consider troponin evaluation.  Will reassess after GI cocktail.     EKG with some new Q waves compared to 1 or 2 years ago, likely not acute but will screen with troponin.  Troponin is negative, patient with significant relief with GI cocktail, appropriate for discharge.  Barth Kirks. Sedonia Small, MD [place] [email or web address]  Final Clinical Impressions(s) / ED Diagnoses     ICD-10-CM   1. Chest pain  R07.9 DG Chest 2 View    DG Chest 2 View      ED Discharge Orders     None        Discharge Instructions Discussed with and Provided to Patient:     Discharge Instructions      You were  evaluated in the Emergency Department and after careful evaluation, we did not find any emergent condition requiring admission or further testing in the hospital.  Your exam/testing today is overall reassuring.  No signs of heart damage today and your blood work.  Recommend over-the-counter Maalox to help with your symptoms.  Please return to the Emergency Department if you experience any worsening of your condition.   Thank you for allowing [place] to be a part of your care.        Maudie Flakes, MD 02/19/21 [phone]

## 2021-02-19 NOTE — ED Triage Notes (Signed)
Pt complains of heartburn x 2 days. Pt states that he has had some diarrhea. Pt states that last time this happened, they gave him some white stuff and it helped him immediately.

## 2021-03-08 ENCOUNTER — Other Ambulatory Visit: Payer: Self-pay | Admitting: Internal Medicine

## 2021-03-25 ENCOUNTER — Other Ambulatory Visit: Payer: Self-pay | Admitting: Cardiology

## 2021-03-26 ENCOUNTER — Telehealth: Payer: Self-pay | Admitting: Student

## 2021-03-26 ENCOUNTER — Other Ambulatory Visit: Payer: Self-pay

## 2021-03-26 MED ORDER — LOSARTAN POTASSIUM 50 MG PO TABS: 50.0000 mg | ORAL_TABLET | Freq: Every day | ORAL | 2 refills | Status: DC

## 2021-03-26 NOTE — Telephone Encounter (Signed)
Med was denied by another cardiology office, I sent this in for him

## 2021-03-26 NOTE — Telephone Encounter (Signed)
Patient says losartan 50 mg refill was denied by [facility] in [facility]. Wants to know why and what to do about it so he can get his prescription. Would like for someone to call him back. [phone].

## 2021-04-23 ENCOUNTER — Other Ambulatory Visit: Payer: Self-pay | Admitting: Cardiology

## 2021-06-11 ENCOUNTER — Ambulatory Visit: Payer: Managed Care, Other (non HMO) | Admitting: Student

## 2021-06-16 ENCOUNTER — Other Ambulatory Visit: Payer: Self-pay | Admitting: Cardiology

## 2021-08-18 NOTE — Progress Notes (Signed)
? ?Primary Physician/Referring:  Biagio Borg, MD ? ?Patient ID: Samuel Hawkins, male    DOB: [birth year], 66 y.o.   MRN: [id number] ? ?Chief Complaint  ?Patient presents with  ? Follow-up  ? DOT clearance  ? ?HPI:   ? ?Samuel Hawkins  is a 66 y.o. with coronary artery disease, history of MI in 2012, status post CABG (LIMA-LAD, RIMA-dRCA, other venous grafts occluded). Repeat angiography revealed high-grade circumflex stenosis. He underwent sccessful complex PCI on 08/02/2018 by Dr. Einar Gip.(Synergy DES 2.25 X 12 mm to high OM1, Synergy DES 3.5 X 15 mm pLCx).  ? ?Patient presents for 1 year follow up. At last office visit patient had discontinued Crestor and lipids remained well controlled with Repatha and Zetia, therefore advised he may stop Crestor. Patient is feeling well without specific complaints today. He does admit to poor diet. His primary concern is that he requires a stress test be done as part of his DOT certification process.  ? ?Denies dyspnea, chest pain.  ? ?Past Medical History:  ?Diagnosis Date  ? CAD (coronary artery disease) 04/2012  ? Dr. Einar Gip: EF 55%; dominant right with diffuse 80% mid vessel disease. Just prior to bifurcation is a 90% stenosis.  D1 90% & LAD 70% after D1 - FFR positive. Large D2 noted.  Proximal circumflex 90% at OM1, ostial OM1 80%. Subtotal OM 2 with TIMI 1 flow (large vessel with several branches).  Ostial ramus 80%.  ? ERECTILE DYSFUNCTION 05/01/2008  ? Glucose intolerance (impaired glucose tolerance)   ? history of  ? History of erectile dysfunction   ? Hyperlipidemia   ? HYPERLIPIDEMIA 05/01/2008  ? Hypertension   ? Impaired glucose tolerance 07/13/2011  ? 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 LIMA-LAD, RIMA-dRCA, SeqSVG-OM3-OM4.  ? ?Past Surgical History:  ?Procedure Laterality Date  ? CARDIAC CATHETERIZATION    ? CORONARY ARTERY BYPASS GRAFT  05/04/2011  ? Procedure: CORONARY ARTERY BYPASS GRAFTING (CABG);  Surgeon: Melrose Nakayama, MD;  Location: [facility];   Service: Open Heart Surgery;  Laterality: N/A;  CABG x  six;  using bilateral  internal mammary arteries and left leg greater saphenous vein harvested endoscopically --LIMA-LAD, RIMA-dRCA, SeqSVG-OM3-OM4  ? CORONARY STENT INTERVENTION N/A 08/02/2018  ? Procedure: CORONARY STENT INTERVENTION;  Surgeon: Adrian Prows, MD;  Location: [facility];  Service: Cardiovascular;  Laterality: N/A;  ? LEFT HEART CATH AND CORS/GRAFTS ANGIOGRAPHY N/A 08/02/2018  ? Procedure: LEFT HEART CATH AND CORS/GRAFTS ANGIOGRAPHY;  Surgeon: Adrian Prows, MD;  Location: [facility];  Service: Cardiovascular;  Laterality: N/A;  ? LEFT HEART CATHETERIZATION WITH CORONARY ANGIOGRAM N/A 04/30/2011  ? Procedure: LEFT HEART CATHETERIZATION WITH CORONARY ANGIOGRAM;  Surgeon: Laverda Page, MD;  Location: [facility] CATH LAB: EF 55%; dominant right with diffuse 80% mid vessel disease. Just prior to bifurcation is a 90% stenosis.  D1 90% & LAD 70% after D1 - FFR positive. Large D2 noted.  Proximal circumflex 90% at OM1, ost OM1 80%. Subtotal OM 2 w/ TIMI 1 flow (large w/ several branches), Ost RI 80%  ? NM MYOVIEW LTD  11/2012  ? Stress EKG showed 1.5 mm ST depression at peak exercise recovering less than 2 minutes into recovery. He exercised for 11:52 min. 12.9 METS. Small-moderate-sized inferior-inferolateral transmural scar without infarction. Mild inferior wall hypokinesis. EF 61%. LOW RISK  ? NM MYOVIEW LTD  08/26/2016  ? Exercise 11:41 min 12.7 MET S -peak heart rate 162 BPM (90% of max predicted). Normal  blood pressure response = excellent exercise capacity..  EF 50%. Medium size, moderate intensity perfusion defect in the basal inferior-basal lateral and inferolateral wall consistent with prior infarction. No evidence of reversibility/ischemia. (Similar to previous study)  ? TRANSTHORACIC ECHOCARDIOGRAM  04/2001  ? EF 55-60%. Mild posterior-inferior hypokinesis. GR 1 DD.  ? ?Family History  ?Problem Relation Age of Onset  ? Heart attack  Brother   ? Stroke Mother   ?  ?Social History  ? ?Tobacco Use  ? Smoking status: Former  ?  Types: Cigarettes  ?  Quit date: 05/10/2011  ?  Years since quitting: 10.2  ? Smokeless tobacco: Never  ? Tobacco comments:  ?  social smoker only prior to MI  ?Substance Use Topics  ? Alcohol use: Not Currently  ? ?Marital Status: Divorced  ?ROS  ?Review of Systems  ?Cardiovascular:  Negative for chest pain, claudication, leg swelling, near-syncope, orthopnea, palpitations, paroxysmal nocturnal dyspnea and syncope.  ?Respiratory:  Negative for shortness of breath.   ?Neurological:  Negative for dizziness.  ?Objective  ?Blood pressure 137/84, pulse 66, temperature 98 ?F (36.7 ?C), temperature source Temporal, resp. rate 17, height '5\' 7"'  (1.702 m), weight 202 lb (91.6 kg), SpO2 96 %.  ? ?  08/19/2021  ?  8:52 AM 02/19/2021  ?  5:00 AM 02/19/2021  ?  4:45 AM  ?Vitals with BMI  ?Height '5\' 7"'     ?Weight 202 lbs    ?BMI 31.63    ?Systolic 354 90 656  ?Diastolic 84 61 812  ?Pulse 66 58 69  ?  ? Physical Exam ?Vitals reviewed.  ?Constitutional:   ?   Appearance: He is well-developed.  ?Neck:  ?   Thyroid: No thyromegaly.  ?   Vascular: No carotid bruit or JVD.  ?Cardiovascular:  ?   Rate and Rhythm: Normal rate and regular rhythm.  ?   Pulses: Intact distal pulses.  ?   Heart sounds: No murmur heard. ?  No gallop.  ?   Comments:  ? ?Pulmonary:  ?   Effort: Pulmonary effort is normal. No accessory muscle usage or respiratory distress.  ?   Breath sounds: Normal breath sounds.  ?Musculoskeletal:  ?   Right lower leg: No edema.  ?   Left lower leg: No edema.  ?Skin: ?   General: Skin is warm and dry.  ?Neurological:  ?   Mental Status: He is alert.  ? ?Laboratory examination:  ? ?Recent Labs  ?  12/12/20 ?1146 02/19/21 ?7517  ?NA 141 133*  ?K 4.8 4.6  ?CL 106 105  ?CO2 22 19*  ?GLUCOSE 92 123*  ?BUN 17 34*  ?CREATININE 1.12 1.67*  ?CALCIUM 9.3 9.8  ?GFRNONAA  --  83*  ? ? ?CrCl cannot be calculated (Patient's most recent lab result is  older than the maximum 21 days allowed.).  ? ?  Latest Ref Rng & Units 02/19/2021  ?  5:19 AM 12/12/2020  ? 11:46 AM 06/11/2020  ?  4:14 PM  ?CMP  ?Glucose 70 - 99 mg/dL 123   92     ?BUN 8 - 23 mg/dL 34   17     ?Creatinine 0.61 - 1.24 mg/dL 1.67   1.12     ?Sodium 135 - 145 mmol/L 133   141     ?Potassium 3.5 - 5.1 mmol/L 4.6   4.8     ?Chloride 98 - 111 mmol/L 105   106     ?CO2 22 -  32 mmol/L 19   22     ?Calcium 8.9 - 10.3 mg/dL 9.8   9.3     ?Total Protein 6.0 - 8.3 g/dL   7.1    ?Total Bilirubin 0.2 - 1.2 mg/dL   0.7    ?Alkaline Phos 39 - 117 U/L   69    ?AST 0 - 37 U/L   26    ?ALT 0 - 53 U/L   24    ? ? ?  Latest Ref Rng & Units 02/19/2021  ?  5:19 AM 12/12/2020  ? 11:46 AM 06/11/2020  ?  4:14 PM  ?CBC  ?WBC 4.0 - 10.5 K/uL 15.8   7.9   7.8    ?Hemoglobin 13.0 - 17.0 g/dL 18.0   16.3   15.6    ?Hematocrit 39.0 - 52.0 % 53.0   47.2   45.3    ?Platelets 150 - 400 K/uL 323   306   280.0    ? ?Lipid Panel  ?   ?Component Value Date/Time  ? CHOL 81 (L) 12/12/2020 1146  ? TRIG 68 12/12/2020 1146  ? TRIG 240 (HH) 04/30/2006 0816  ? HDL 32 (L) 12/12/2020 1146  ? CHOLHDL 2 06/11/2020 1614  ? VLDL 20.2 06/11/2020 1614  ? LDLCALC 34 12/12/2020 1146  ? LDLDIRECT 176.2 07/10/2010 1146  ? ?HEMOGLOBIN A1C ?Lab Results  ?Component Value Date  ? HGBA1C 5.8 06/11/2020  ? MPG 120 03/09/2016  ? ?TSH ?No results for input(s): TSH in the last 8760 hours. ? ?Allergies  ? ?Allergies  ?Allergen Reactions  ? Atorvastatin Other (See Comments)  ?  muscle cramp  ? Simvastatin Other (See Comments)  ?  muscle cramp  ?  ?Medications Prior to Visit:  ? ?Outpatient Medications Prior to Visit  ?Medication Sig Dispense Refill  ? aspirin 81 MG chewable tablet Chew 81 mg by mouth daily.    ? carvedilol (COREG) 3.125 MG tablet TAKE ONE TABLET BY MOUTH TWICE A DAY 180 tablet 3  ? Evolocumab (REPATHA SURECLICK) 695 MG/ML SOAJ INJECT 140MG INTO THE SKIN EVERY 14 DAYS 6 mL 1  ? ezetimibe (ZETIA) 10 MG tablet TAKE ONE TABLET BY MOUTH DAILY **PLEASE  SCHEDULE APPOINTMENT FOR ADDITIONAL REFILLS** 90 tablet 2  ? losartan (COZAAR) 50 MG tablet Take 1 tablet (50 mg total) by mouth daily. 90 tablet 2  ? nitroGLYCERIN (NITROSTAT) 0.4 MG SL tablet Place 1 tablet (0.

## 2021-08-19 ENCOUNTER — Ambulatory Visit: Payer: Managed Care, Other (non HMO) | Admitting: Student

## 2021-08-19 ENCOUNTER — Encounter: Payer: Self-pay | Admitting: Student

## 2021-08-19 VITALS — BP 137/84 | HR 66 | Temp 98.0°F | Resp 17 | Ht 67.0 in | Wt 202.0 lb

## 2021-08-19 DIAGNOSIS — I10 Essential (primary) hypertension: Secondary | ICD-10-CM

## 2021-08-19 DIAGNOSIS — I25810 Atherosclerosis of coronary artery bypass graft(s) without angina pectoris: Secondary | ICD-10-CM

## 2021-08-19 DIAGNOSIS — E785 Hyperlipidemia, unspecified: Secondary | ICD-10-CM

## 2021-08-23 LAB — LIPID PANEL WITH LDL/HDL RATIO
Cholesterol, Total: 74 mg/dL — ABNORMAL LOW (ref 100–199)
HDL: 33 mg/dL — ABNORMAL LOW (ref >39)
LDL Chol Calc (NIH): 26 mg/dL (ref 0–99)
LDL/HDL Ratio: 0.8 ratio (ref 0.0–3.6)
Triglycerides: 68 mg/dL (ref 0–149)
VLDL Cholesterol Cal: 15 mg/dL (ref 5–40)

## 2021-08-23 LAB — CBC
Hematocrit: 44.7 % (ref 37.5–51.0)
Hemoglobin: 15.6 g/dL (ref 13.0–17.7)
MCH: 31 pg (ref 26.6–33.0)
MCHC: 34.9 g/dL (ref 31.5–35.7)
MCV: 89 fL (ref 79–97)
Platelets: 304 10*3/uL (ref 150–450)
RBC: 5.03 x10E6/uL (ref 4.14–5.80)
RDW: 12.4 % (ref 11.6–15.4)
WBC: 7.9 10*3/uL (ref 3.4–10.8)

## 2021-08-23 LAB — BASIC METABOLIC PANEL
BUN/Creatinine Ratio: 14 (ref 10–24)
BUN: 15 mg/dL (ref 8–27)
CO2: 22 mmol/L (ref 20–29)
Calcium: 8.9 mg/dL (ref 8.6–10.2)
Chloride: 109 mmol/L — ABNORMAL HIGH (ref 96–106)
Creatinine, Ser: 1.1 mg/dL (ref 0.76–1.27)
Glucose: 106 mg/dL — ABNORMAL HIGH (ref 70–99)
Potassium: 4.5 mmol/L (ref 3.5–5.2)
Sodium: 143 mmol/L (ref 134–144)
eGFR: 75 mL/min/{1.73_m2} (ref >59)

## 2021-08-25 NOTE — Progress Notes (Signed)
Pt called back was informed about his labs

## 2021-08-25 NOTE — Progress Notes (Signed)
Called pt no answer eft a vm

## 2021-09-26 ENCOUNTER — Ambulatory Visit: Payer: Managed Care, Other (non HMO)

## 2021-09-26 DIAGNOSIS — I25810 Atherosclerosis of coronary artery bypass graft(s) without angina pectoris: Secondary | ICD-10-CM

## 2021-10-08 IMAGING — CR DG CHEST 2V
2 series · 2 of 2 positions shown · non-contrast
Comparison: Chest x-ray 06/01/2011.

CLINICAL DATA: 64-year-old male with history of mid chest pain for
the past 2 days.

EXAM:
CHEST - 2 VIEW

[w chest pa]
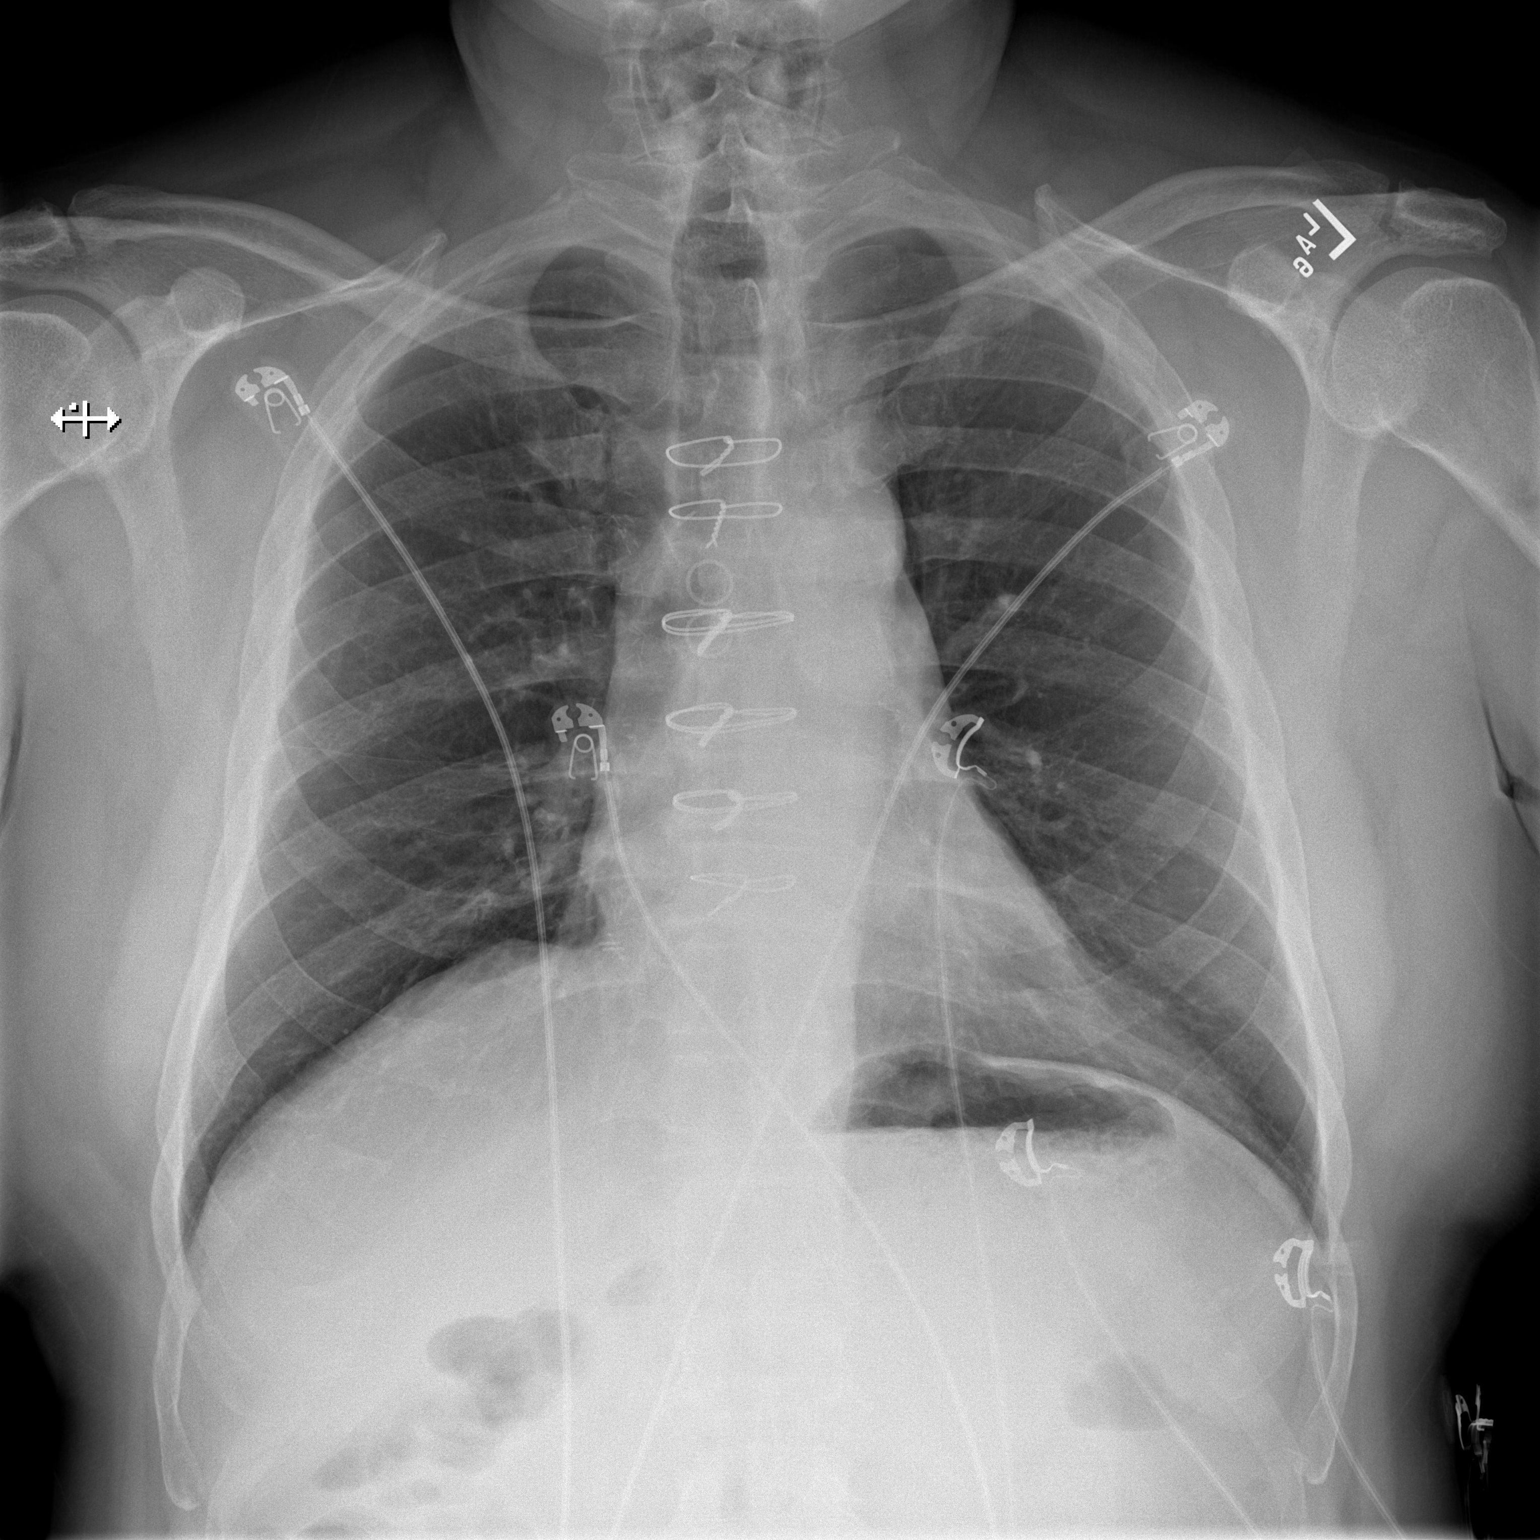

[w chest lat]
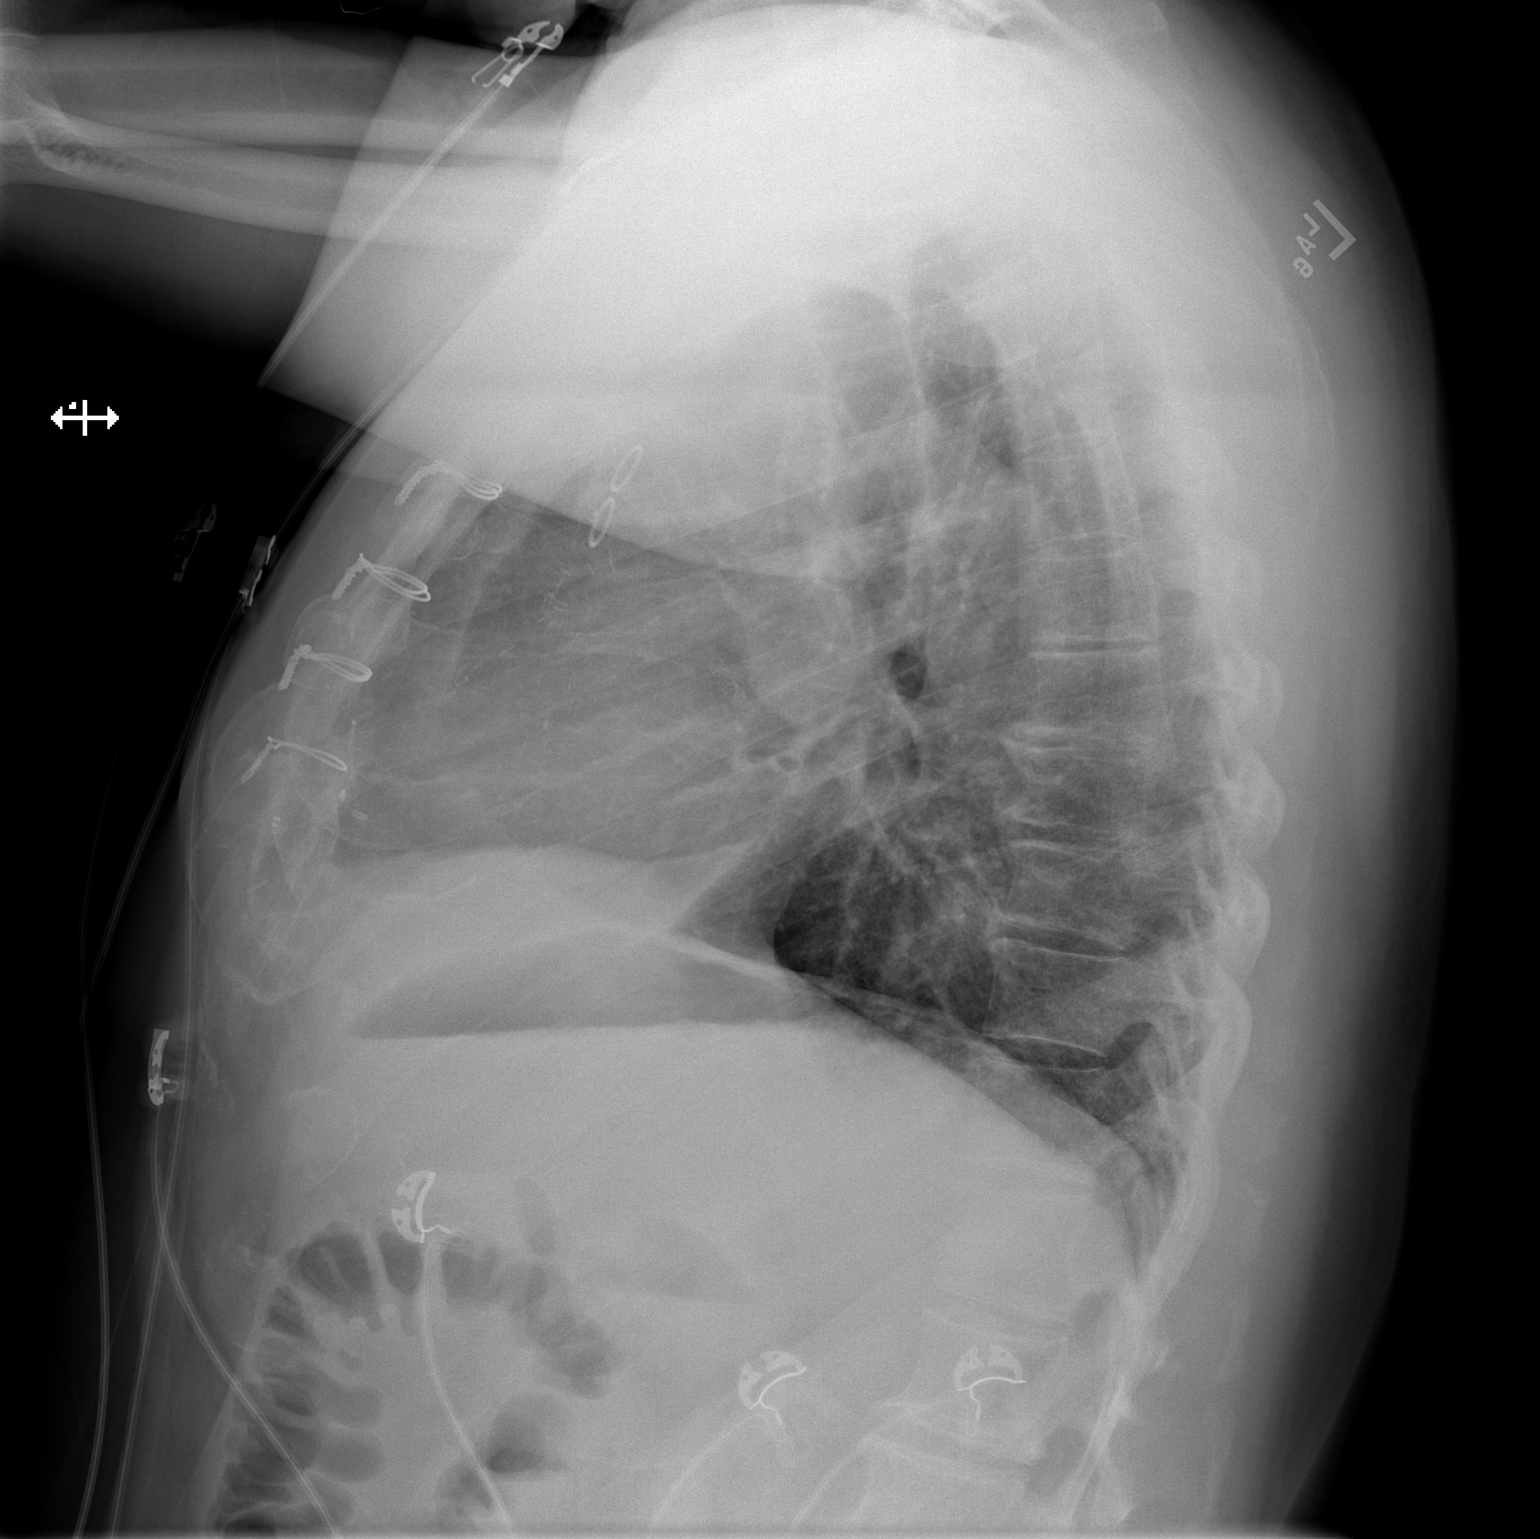

[2 of 2 positions shown; findings below may reference images not displayed]

FINDINGS: Lung volumes are normal. No consolidative airspace disease. No
pleural effusions. No pneumothorax. No pulmonary nodule or mass
noted. Pulmonary vasculature and the cardiomediastinal silhouette
are within normal limits. Status post median sternotomy for CABG.
IMPRESSION: No radiographic evidence of acute cardiopulmonary disease.

## 2021-10-27 ENCOUNTER — Other Ambulatory Visit: Payer: Self-pay | Admitting: Internal Medicine

## 2021-10-27 ENCOUNTER — Encounter: Payer: Self-pay | Admitting: Internal Medicine

## 2021-10-27 ENCOUNTER — Ambulatory Visit: Payer: Managed Care, Other (non HMO) | Admitting: Internal Medicine

## 2021-10-27 VITALS — BP 124/76 | HR 56 | Temp 98.1°F | Ht 67.0 in | Wt 196.0 lb

## 2021-10-27 DIAGNOSIS — Z0001 Encounter for general adult medical examination with abnormal findings: Secondary | ICD-10-CM | POA: Diagnosis not present

## 2021-10-27 DIAGNOSIS — Z125 Encounter for screening for malignant neoplasm of prostate: Secondary | ICD-10-CM

## 2021-10-27 DIAGNOSIS — I10 Essential (primary) hypertension: Secondary | ICD-10-CM

## 2021-10-27 DIAGNOSIS — E785 Hyperlipidemia, unspecified: Secondary | ICD-10-CM

## 2021-10-27 DIAGNOSIS — R7302 Impaired glucose tolerance (oral): Secondary | ICD-10-CM

## 2021-10-27 DIAGNOSIS — R300 Dysuria: Secondary | ICD-10-CM | POA: Diagnosis not present

## 2021-10-27 DIAGNOSIS — E538 Deficiency of other specified B group vitamins: Secondary | ICD-10-CM

## 2021-10-27 DIAGNOSIS — E559 Vitamin D deficiency, unspecified: Secondary | ICD-10-CM

## 2021-10-27 DIAGNOSIS — Z202 Contact with and (suspected) exposure to infections with a predominantly sexual mode of transmission: Secondary | ICD-10-CM | POA: Diagnosis not present

## 2021-10-27 DIAGNOSIS — R3129 Other microscopic hematuria: Secondary | ICD-10-CM

## 2021-10-27 LAB — CBC WITH DIFFERENTIAL/PLATELET
Basophils Absolute: 0 10*3/uL (ref 0.0–0.1)
Basophils Relative: 0.4 % (ref 0.0–3.0)
Eosinophils Absolute: 0.2 10*3/uL (ref 0.0–0.7)
Eosinophils Relative: 2.3 % (ref 0.0–5.0)
HCT: 45.2 % (ref 39.0–52.0)
Hemoglobin: 15.4 g/dL (ref 13.0–17.0)
Lymphocytes Relative: 36 % (ref 12.0–46.0)
Lymphs Abs: 2.9 10*3/uL (ref 0.7–4.0)
MCHC: 34 g/dL (ref 30.0–36.0)
MCV: 91.6 fl (ref 78.0–100.0)
Monocytes Absolute: 0.7 10*3/uL (ref 0.1–1.0)
Monocytes Relative: 8.4 % (ref 3.0–12.0)
Neutro Abs: 4.2 10*3/uL (ref 1.4–7.7)
Neutrophils Relative %: 52.9 % (ref 43.0–77.0)
Platelets: 297 10*3/uL (ref 150.0–400.0)
RBC: 4.94 Mil/uL (ref 4.22–5.81)
RDW: 13.4 % (ref 11.5–15.5)
WBC: 8 10*3/uL (ref 4.0–10.5)

## 2021-10-27 LAB — BASIC METABOLIC PANEL
BUN: 19 mg/dL (ref 6–23)
CO2: 27 mEq/L (ref 19–32)
Calcium: 8.9 mg/dL (ref 8.4–10.5)
Chloride: 108 mEq/L (ref 96–112)
Creatinine, Ser: 1.07 mg/dL (ref 0.40–1.50)
GFR: 73.81 mL/min (ref >60.00)
Glucose, Bld: 106 mg/dL — ABNORMAL HIGH (ref 70–99)
Potassium: 4.4 mEq/L (ref 3.5–5.1)
Sodium: 141 mEq/L (ref 135–145)

## 2021-10-27 LAB — HEMOGLOBIN A1C: Hgb A1c MFr Bld: 5.6 % (ref 4.6–6.5)

## 2021-10-27 LAB — URINALYSIS, ROUTINE W REFLEX MICROSCOPIC
Bilirubin Urine: NEGATIVE
Ketones, ur: NEGATIVE
Leukocytes,Ua: NEGATIVE
Nitrite: NEGATIVE
Specific Gravity, Urine: >=1.03 — AB (ref 1.000–1.030)
Total Protein, Urine: NEGATIVE
Urine Glucose: NEGATIVE
Urobilinogen, UA: 0.2 (ref 0.0–1.0)
pH: 6 (ref 5.0–8.0)

## 2021-10-27 LAB — HEPATIC FUNCTION PANEL
ALT: 18 U/L (ref 0–53)
AST: 19 U/L (ref 0–37)
Albumin: 4.2 g/dL (ref 3.5–5.2)
Alkaline Phosphatase: 68 U/L (ref 39–117)
Bilirubin, Direct: 0.1 mg/dL (ref 0.0–0.3)
Total Bilirubin: 0.4 mg/dL (ref 0.2–1.2)
Total Protein: 6.9 g/dL (ref 6.0–8.3)

## 2021-10-27 LAB — LIPID PANEL
Cholesterol: 75 mg/dL (ref 0–200)
HDL: 29.3 mg/dL — ABNORMAL LOW (ref >39.00)
LDL Cholesterol: 30 mg/dL (ref 0–99)
NonHDL: 45.58
Total CHOL/HDL Ratio: 3
Triglycerides: 77 mg/dL (ref 0.0–149.0)
VLDL: 15.4 mg/dL (ref 0.0–40.0)

## 2021-10-27 LAB — TSH: TSH: 5.33 u[IU]/mL (ref 0.35–5.50)

## 2021-10-27 LAB — VITAMIN B12: Vitamin B-12: 483 pg/mL (ref 211–911)

## 2021-10-27 LAB — PSA: PSA: 1.51 ng/mL (ref 0.10–4.00)

## 2021-10-27 LAB — VITAMIN D 25 HYDROXY (VIT D DEFICIENCY, FRACTURES): VITD: 29.41 ng/mL — ABNORMAL LOW (ref 30.00–100.00)

## 2021-10-27 NOTE — Patient Instructions (Addendum)
Please remember to have your second Shingles shot done at the [facility]  Please continue all other medications as before, and refills have been done if requested.  Please have the pharmacy call with any other refills you may need.  Please continue your efforts at being more active, low cholesterol diet, and weight control.  You are otherwise up to date with prevention measures today.  Please keep your appointments with your specialists as you may have planned  Please go to the LAB at the blood drawing area for the tests to be done  You will be contacted by phone if any changes need to be made immediately.  Otherwise, you will receive a letter about your results with an explanation, but please check with MyChart first.  Please remember to sign up for MyChart if you have not done so, as this will be important to you in the future with finding out test results, communicating by private email, and scheduling acute appointments online when needed.  Please make an Appointment to return for your 1 year visit, or sooner if needed

## 2021-10-27 NOTE — Progress Notes (Signed)
Patient ID: Samuel Hawkins, male   DOB: 1955/08/05, 66 y.o.   MRN: [id number]         Chief Complaint:: wellness exam and dysuria, STD exposure       HPI:  Samuel Hawkins is a 66 y.o. male here for wellness exam; due for second shingrix to be done at the pharmacy; declines covid booster, tdap o/w up to date                        Also c/o 3 days onset mild intermittent dysuria last wk, now none for 3 days, and Denies urinary symptoms such as dysuria, frequency, urgency, flank pain, hematuria or n/v, fever, chills.  Pt asking for STD testing as has had possible exposure with unprotected intercourse recently.  No overt GU symptoms such as ulcer, penile d/c, rash   Pt denies chest pain, increased sob or doe, wheezing, orthopnea, PND, increased LE swelling, palpitations, dizziness or syncope.   Pt denies polydipsia, polyuria, or new focal neuro s/s.    Pt denies fever, wt loss, night sweats, loss of appetite, or other constitutional symptoms     Wt Readings from Last 3 Encounters:  10/27/21 196 lb (88.9 kg)  08/19/21 202 lb (91.6 kg)  12/12/20 197 lb (89.4 kg)   BP Readings from Last 3 Encounters:  10/27/21 124/76  08/19/21 137/84  02/19/21 90/61   Immunization History  Administered Date(s) Administered   Influenza, Quadrivalent, Recombinant, Inj, Pf 02/12/2019   Influenza-Unspecified 03/01/2020   PFIZER(Purple Top)SARS-COV-2 Vaccination 07/27/2019, 08/21/2019, 02/22/2020   Td 07/17/2010   Zoster Recombinat (Shingrix) 02/18/2019  There are no preventive care reminders to display for this patient.    Past Medical History:  Diagnosis Date   CAD (coronary artery disease) 04/2012   Dr. Einar Gip: EF 55%; dominant right with diffuse 80% mid vessel disease. Just prior to bifurcation is a 90% stenosis.  D1 90% & LAD 70% after D1 - FFR positive. Large D2 noted.  Proximal circumflex 90% at OM1, ostial OM1 80%. Subtotal OM 2 with TIMI 1 flow (large vessel with several branches).  Ostial ramus 80%.   ERECTILE  DYSFUNCTION 05/01/2008   Glucose intolerance (impaired glucose tolerance)    history of   History of erectile dysfunction    Hyperlipidemia    HYPERLIPIDEMIA 05/01/2008   Hypertension    Impaired glucose tolerance 07/13/2011   S/P CABG (coronary artery bypass graft) 07/13/2011   LIMA-LAD, RIMA-dRCA, SeqSVG-OM3-OM4.   Past Surgical History:  Procedure Laterality Date   CARDIAC CATHETERIZATION     CORONARY ARTERY BYPASS GRAFT  05/04/2011   Procedure: CORONARY ARTERY BYPASS GRAFTING (CABG);  Surgeon: Melrose Nakayama, MD;  Location: [facility];  Service: Open Heart Surgery;  Laterality: N/A;  CABG x  six;  using bilateral  internal mammary arteries and left leg greater saphenous vein harvested endoscopically --LIMA-LAD, RIMA-dRCA, SeqSVG-OM3-OM4   CORONARY STENT INTERVENTION N/A 08/02/2018   Procedure: CORONARY STENT INTERVENTION;  Surgeon: Adrian Prows, MD;  Location: [facility];  Service: Cardiovascular;  Laterality: N/A;   LEFT HEART CATH AND CORS/GRAFTS ANGIOGRAPHY N/A 08/02/2018   Procedure: LEFT HEART CATH AND CORS/GRAFTS ANGIOGRAPHY;  Surgeon: Adrian Prows, MD;  Location: [facility];  Service: Cardiovascular;  Laterality: N/A;   LEFT HEART CATHETERIZATION WITH CORONARY ANGIOGRAM N/A 04/30/2011   Procedure: LEFT HEART CATHETERIZATION WITH CORONARY ANGIOGRAM;  Surgeon: Laverda Page, MD;  Location: [facility] C[facility] LAB: EF 55%; dominant right with diffuse 80%  mid vessel disease. Just prior to bifurcation is a 90% stenosis.  D1 90% & LAD 70% after D1 - FFR positive. Large D2 noted.  Proximal circumflex 90% at OM1, ost OM1 80%. Subtotal OM 2 w/ TIMI 1 flow (large w/ several branches), Ost RI 80%   NM MYOVIEW LTD  11/2012   Stress EKG showed 1.5 mm ST depression at peak exercise recovering less than 2 minutes into recovery. He exercised for 11:52 min. 12.9 METS. Small-moderate-sized inferior-inferolateral transmural scar without infarction. Mild inferior wall hypokinesis. EF 61%. LOW RISK    NM MYOVIEW LTD  08/26/2016   Exercise 11:41 min 12.7 MET S -peak heart rate 162 BPM (90% of max predicted). Normal blood pressure response = excellent exercise capacity..  EF 50%. Medium size, moderate intensity perfusion defect in the basal inferior-basal lateral and inferolateral wall consistent with prior infarction. No evidence of reversibility/ischemia. (Similar to previous study)   TRANSTHORACIC ECHOCARDIOGRAM  04/2001   EF 55-60%. Mild posterior-inferior hypokinesis. GR 1 DD.    reports that he quit smoking about 10 years ago. His smoking use included cigarettes. He has never used smokeless tobacco. He reports that he does not currently use alcohol. He reports that he does not use drugs. family history includes Heart attack in his brother; Stroke in his mother. Allergies  Allergen Reactions   Atorvastatin Other (See Comments)    muscle cramp   Simvastatin Other (See Comments)    muscle cramp   Current Outpatient Medications on File Prior to Visit  Medication Sig Dispense Refill   aspirin 81 MG chewable tablet Chew 81 mg by mouth daily.     carvedilol (COREG) 3.125 MG tablet TAKE ONE TABLET BY MOUTH TWICE A DAY 180 tablet 3   Evolocumab (REPATHA SURECLICK) 856 MG/ML SOAJ INJECT 140MG INTO THE SKIN EVERY 14 DAYS 6 mL 1   ezetimibe (ZETIA) 10 MG tablet TAKE ONE TABLET BY MOUTH DAILY **PLEASE SCHEDULE APPOINTMENT FOR ADDITIONAL REFILLS** 90 tablet 2   losartan (COZAAR) 50 MG tablet Take 1 tablet (50 mg total) by mouth daily. 90 tablet 2   nitroGLYCERIN (NITROSTAT) 0.4 MG SL tablet Place 1 tablet (0.4 mg total) under the tongue every 5 (five) minutes as needed for chest pain. 15 tablet 1   pantoprazole (PROTONIX) 40 MG tablet TAKE ONE TABLET BY MOUTH DAILY BEFORE BREAKFAST AS NEEDED FOR HEARTBURN 90 tablet 3   vardenafil (LEVITRA) 20 MG tablet TAKE ONE TABLET BY MOUTH DAILY AS NEEDED FOR ERECTILE DYSFUNCTION 10 tablet 5   No current facility-administered medications on file prior to visit.         ROS:  All others reviewed and negative.  Objective        PE:  BP 124/76 (BP Location: Right Arm, Patient Position: Sitting, Cuff Size: Large)   Pulse (!) 56   Temp 98.1 F (36.7 C) (Oral)   Ht '5\' 7"'  (1.702 m)   Wt 196 lb (88.9 kg)   SpO2 95%   BMI 30.70 kg/m                 Constitutional: Pt appears in NAD               HENT: Head: NCAT.                Right Ear: External ear normal.                 Left Ear: External ear normal.  Eyes: . Pupils are equal, round, and reactive to light. Conjunctivae and EOM are normal               Nose: without d/c or deformity               Neck: Neck supple. Gross normal ROM               Cardiovascular: Normal rate and regular rhythm.                 Pulmonary/Chest: Effort normal and breath sounds without rales or wheezing.                Abd:  Soft, NT, ND, + BS, no organomegaly               GU - normal male ext genitalia without d/c or rash               Neurological: Pt is alert. At baseline orientation, motor grossly intact               Skin: Skin is warm. No rashes, no other new lesions, LE edema - none               Psychiatric: Pt behavior is normal without agitation   Micro: none  Cardiac tracings I have personally interpreted today:  none  Pertinent Radiological findings (summarize): none   Lab Results  Component Value Date   WBC 8.0 10/27/2021   HGB 15.4 10/27/2021   HCT 45.2 10/27/2021   PLT 297.0 10/27/2021   GLUCOSE 106 (H) 10/27/2021   CHOL 75 10/27/2021   TRIG 77.0 10/27/2021   HDL 29.30 (L) 10/27/2021   LDLDIRECT 176.2 07/10/2010   LDLCALC 30 10/27/2021   ALT 18 10/27/2021   AST 19 10/27/2021   NA 141 10/27/2021   K 4.4 10/27/2021   CL 108 10/27/2021   CREATININE 1.07 10/27/2021   BUN 19 10/27/2021   CO2 27 10/27/2021   TSH 5.33 10/27/2021   PSA 1.51 10/27/2021   INR 1.54 (H) 05/04/2011   HGBA1C 5.6 10/27/2021   Assessment/Plan:  Samuel Hawkins is a 66 y.o. Other or two or more  races [6] male with  has a past medical history of CAD (coronary artery disease) (04/2012), ERECTILE DYSFUNCTION (05/01/2008), Glucose intolerance (impaired glucose tolerance), History of erectile dysfunction, Hyperlipidemia, HYPERLIPIDEMIA (05/01/2008), Hypertension, Impaired glucose tolerance (07/13/2011), and S/P CABG (coronary artery bypass graft) (07/13/2011).  Encounter for well adult exam with abnormal findings Age and sex appropriate education and counseling updated with regular exercise and diet Referrals for preventative services - none needed Immunizations addressed - pt to call for shingrix #2, declines coviid booster, tdap Smoking counseling  - none needed Evidence for depression or other mood disorder - none significant Most recent labs reviewed. I have personally reviewed and have noted: 1) the patient's medical and social history 2) The patient's current medications and supplements 3) The patient's height, weight, and BMI have been recorded in the chart   Impaired glucose tolerance Lab Results  Component Value Date   HGBA1C 5.6 10/27/2021   Stable, pt to continue current medical treatment  - diet, wt control   Hyperlipidemia with target LDL less than 70 Lab Results  Component Value Date   LDLCALC 30 10/27/2021   Stable, pt to continue current statin repatha 140, and zetia 10 mg qd   Essential hypertension BP Readings from Last 3 Encounters:  10/27/21 124/76  08/19/21 137/84  02/19/21 90/61   Stable, pt to continue medical treatment coreg 3.125 qd, losartan 50 qd   Possible exposure to STD Pt for routine STD testing per reqeust  Dysuria Onset 3 days last wk, then none last 3 days, also for UA with labs  Vitamin D deficiency Last vitamin D Lab Results  Component Value Date   VD25OH 29.41 (L) 10/27/2021   Low, to start oral replacement  Followup: Return in about 1 year (around 10/28/2022).  Cathlean Cower, MD 10/28/2021 7:11 PM [facility]

## 2021-10-28 ENCOUNTER — Encounter: Payer: Self-pay | Admitting: Internal Medicine

## 2021-10-28 DIAGNOSIS — E559 Vitamin D deficiency, unspecified: Secondary | ICD-10-CM | POA: Insufficient documentation

## 2021-10-28 LAB — RPR: RPR Ser Ql: NONREACTIVE

## 2021-10-28 LAB — HSV 2 ANTIBODY, IGG: HSV 2 Glycoprotein G Ab, IgG: 1.57 index — ABNORMAL HIGH

## 2021-10-28 LAB — HIV ANTIBODY (ROUTINE TESTING W REFLEX): HIV 1&2 Ab, 4th Generation: NONREACTIVE

## 2021-10-28 NOTE — Assessment & Plan Note (Signed)
Lab Results  Component Value Date   HGBA1C 5.6 10/27/2021   Stable, pt to continue current medical treatment  - diet, wt control

## 2021-10-28 NOTE — Assessment & Plan Note (Signed)
Last vitamin D Lab Results  Component Value Date   VD25OH 29.41 (L) 10/27/2021   Low, to start oral replacement

## 2021-10-28 NOTE — Assessment & Plan Note (Signed)
Age and sex appropriate education and counseling updated with regular exercise and diet Referrals for preventative services - none needed Immunizations addressed - pt to call for shingrix #2, declines coviid booster, tdap Smoking counseling  - none needed Evidence for depression or other mood disorder - none significant Most recent labs reviewed. I have personally reviewed and have noted: 1) the patient's medical and social history 2) The patient's current medications and supplements 3) The patient's height, weight, and BMI have been recorded in the chart

## 2021-10-28 NOTE — Assessment & Plan Note (Signed)
Onset 3 days last wk, then none last 3 days, also for UA with labs

## 2021-10-28 NOTE — Assessment & Plan Note (Signed)
Lab Results  Component Value Date   LDLCALC 30 10/27/2021   Stable, pt to continue current statin repatha 140, and zetia 10 mg qd

## 2021-10-28 NOTE — Assessment & Plan Note (Signed)
Pt for routine STD testing per reqeust

## 2021-10-28 NOTE — Assessment & Plan Note (Signed)
BP Readings from Last 3 Encounters:  10/27/21 124/76  08/19/21 137/84  02/19/21 90/61   Stable, pt to continue medical treatment coreg 3.125 qd, losartan 50 qd

## 2021-10-29 ENCOUNTER — Telehealth: Payer: Self-pay | Admitting: Internal Medicine

## 2021-10-29 MED ORDER — VALACYCLOVIR HCL 1 G PO TABS: 1000.0000 mg | ORAL_TABLET | Freq: Two times a day (BID) | ORAL | 3 refills | Status: AC

## 2021-10-29 NOTE — Telephone Encounter (Signed)
PT visits today and requests that we give him a call once his results for his GC/Chlamydia Probe comes in! He would like this result to be printed and he will come to pick it up for his records.  CB: [phone]

## 2021-10-29 NOTE — Telephone Encounter (Signed)
Discussed results with patient in detail. 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

## 2021-10-29 NOTE — Telephone Encounter (Signed)
Ok I sent valtrex to be taken as needed for outbreaks

## 2021-10-29 NOTE — Telephone Encounter (Signed)
Pt requesting callback from assistant regarding STD results.

## 2021-10-30 NOTE — Telephone Encounter (Signed)
Left message for patient to call me back. 

## 2021-10-30 NOTE — Telephone Encounter (Signed)
Pt returned call and I gave him the prescription information and pharmacy location.   Fyi

## 2021-10-30 NOTE — Telephone Encounter (Signed)
Pt returned call from [facility].please return call asap.

## 2021-10-31 LAB — GC/CHLAMYDIA PROBE AMP
Chlamydia trachomatis, NAA: NEGATIVE
Neisseria Gonorrhoeae by PCR: NEGATIVE

## 2021-11-04 ENCOUNTER — Telehealth: Payer: Self-pay | Admitting: Internal Medicine

## 2021-11-04 NOTE — Telephone Encounter (Signed)
Pt called stating that medication  Valacyclovir is causing a burning sensation in private parts.  Pt states started taking twice per day since Thurs  Please call patient.

## 2021-11-04 NOTE — Telephone Encounter (Signed)
Spoke with patient regarding medication. 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

## 2021-11-04 NOTE — Telephone Encounter (Signed)
Fortunately we can say this is not from the medication, so need for medication change at this time    thanks

## 2021-11-06 ENCOUNTER — Encounter: Payer: Self-pay | Admitting: Internal Medicine

## 2021-11-06 ENCOUNTER — Ambulatory Visit: Payer: Managed Care, Other (non HMO) | Admitting: Internal Medicine

## 2021-11-06 VITALS — BP 130/70 | HR 46 | Temp 97.8°F | Ht 67.0 in | Wt 196.0 lb

## 2021-11-06 DIAGNOSIS — R3129 Other microscopic hematuria: Secondary | ICD-10-CM | POA: Diagnosis not present

## 2021-11-06 DIAGNOSIS — A6000 Herpesviral infection of urogenital system, unspecified: Secondary | ICD-10-CM | POA: Diagnosis not present

## 2021-11-06 DIAGNOSIS — M722 Plantar fascial fibromatosis: Secondary | ICD-10-CM | POA: Diagnosis not present

## 2021-11-06 MED ORDER — MELOXICAM 15 MG PO TABS: ORAL_TABLET | ORAL | 5 refills | Status: DC

## 2021-11-06 NOTE — Progress Notes (Unsigned)
Patient ID: Samuel Hawkins, male   DOB: 10/06/1955, 66 y.o.   MRN: [id number]        Chief Complaint: follow up HTN, HLD and hyperglycemia ***       HPI:  Samuel Hawkins is a 66 y.o. male here with c/o        Wt Readings from Last 3 Encounters:  11/06/21 196 lb (88.9 kg)  10/27/21 196 lb (88.9 kg)  08/19/21 202 lb (91.6 kg)   BP Readings from Last 3 Encounters:  11/06/21 130/70  10/27/21 124/76  08/19/21 137/84         Past Medical History:  Diagnosis Date   CAD (coronary artery disease) 04/2012   Dr. Einar Gip: EF 55%; dominant right with diffuse 80% mid vessel disease. Just prior to bifurcation is a 90% stenosis.  D1 90% & LAD 70% after D1 - FFR positive. Large D2 noted.  Proximal circumflex 90% at OM1, ostial OM1 80%. Subtotal OM 2 with TIMI 1 flow (large vessel with several branches).  Ostial ramus 80%.   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   Past Surgical History:  Procedure Laterality Date   CARDIAC CATHETERIZATION     CORONARY ARTERY BYPASS GRAFT  05/04/2011   Procedure: CORONARY ARTERY BYPASS GRAFTING (CABG);  Surgeon: Melrose Nakayama, MD;  Location: [facility];  Service: Open Heart Surgery;  Laterality: N/A;  CABG x  six;  using bilateral  internal mammary arteries and left leg greater saphenous vein harvested endoscopically --LIMA-LAD, RIMA-dRCA, SeqSVG-OM3-OM4   CORONARY STENT INTERVENTION N/A 08/02/2018   Procedure: CORONARY STENT INTERVENTION;  Surgeon: Adrian Prows, MD;  Location: [facility];  Service: Cardiovascular;  Laterality: N/A;   LEFT HEART CATH AND CORS/GRAFTS ANGIOGRAPHY N/A 08/02/2018   Procedure: LEFT HEART CATH AND CORS/GRAFTS ANGIOGRAPHY;  Surgeon: Adrian Prows, MD;   Location: [facility];  Service: Cardiovascular;  Laterality: N/A;   LEFT HEART CATHETERIZATION WITH CORONARY ANGIOGRAM N/A 04/30/2011   Procedure: LEFT HEART CATHETERIZATION WITH CORONARY ANGIOGRAM;  Surgeon: Laverda Page, MD;  Location: [place] [facility]: EF 55%; dominant right with diffuse 80% mid vessel disease. Just prior to bifurcation is a 90% stenosis.  D1 90% & LAD 70% after D1 - FFR positive. Large D2 noted.  Proximal circumflex 90% at OM1, ost OM1 80%. Subtotal OM 2 w/ TIMI 1 flow (large w/ several branches), Ost RI 80%   NM MYOVIEW LTD  11/2012   Stress EKG showed 1.5 mm ST depression at peak exercise recovering less than 2 minutes into recovery. He exercised for 11:52 min. 12.9 METS. Small-moderate-sized inferior-inferolateral transmural scar without infarction. Mild inferior wall hypokinesis. EF 61%. LOW RISK   NM MYOVIEW LTD  08/26/2016   Exercise 11:41 min 12.7 MET S -peak heart rate 162 BPM (90% of max predicted). Normal blood pressure response = excellent exercise capacity..  EF 50%. Medium size, moderate intensity perfusion defect in the basal inferior-basal lateral and inferolateral wall consistent with prior infarction. No evidence of reversibility/ischemia. (Similar to previous study)   TRANSTHORACIC ECHOCARDIOGRAM  04/2001   EF 55-60%. Mild posterior-inferior hypokinesis. GR 1 DD.    reports that he quit smoking about 10 years ago. His smoking use included cigarettes.  He has never used smokeless tobacco. He reports that he does not currently use alcohol. He reports that he does not use drugs. family history includes Heart attack in his brother; Stroke in his mother. Allergies  Allergen Reactions   Atorvastatin Other (See Comments)    muscle cramp   Simvastatin Other (See Comments)    muscle cramp   Current Outpatient Medications on File Prior to Visit  Medication Sig Dispense Refill   aspirin 81 MG chewable tablet Chew 81 mg by mouth daily.     carvedilol (COREG)  3.125 MG tablet TAKE ONE TABLET BY MOUTH TWICE A DAY 180 tablet 3   Evolocumab (REPATHA SURECLICK) 264 MG/ML SOAJ INJECT 140MG INTO THE SKIN EVERY 14 DAYS 6 mL 1   ezetimibe (ZETIA) 10 MG tablet TAKE ONE TABLET BY MOUTH DAILY **PLEASE SCHEDULE APPOINTMENT FOR ADDITIONAL REFILLS** 90 tablet 2   losartan (COZAAR) 50 MG tablet Take 1 tablet (50 mg total) by mouth daily. 90 tablet 2   nitroGLYCERIN (NITROSTAT) 0.4 MG SL tablet Place 1 tablet (0.4 mg total) under the tongue every 5 (five) minutes as needed for chest pain. 15 tablet 1   pantoprazole (PROTONIX) 40 MG tablet TAKE ONE TABLET BY MOUTH DAILY BEFORE BREAKFAST AS NEEDED FOR HEARTBURN 90 tablet 3   valACYclovir (VALTREX) 1000 MG tablet Take 1 tablet (1,000 mg total) by mouth 2 (two) times daily for 10 days. As needed for outbreaks 20 tablet 3   vardenafil (LEVITRA) 20 MG tablet TAKE ONE TABLET BY MOUTH DAILY AS NEEDED FOR ERECTILE DYSFUNCTION 10 tablet 5   No current facility-administered medications on file prior to visit.        ROS:  All others reviewed and negative.  Objective        PE:  BP 130/70 (BP Location: Right Arm, Patient Position: Sitting, Cuff Size: Large)   Pulse (!) 46   Temp 97.8 F (36.6 C) (Oral)   Ht _0  (1.702 m)   Wt 196 lb (88.9 kg)   SpO2 97%   BMI 30.70 kg/m                 Constitutional: Pt appears in NAD               HENT: Head: NCAT.                Right Ear: External ear normal.                 Left Ear: External ear normal.                Eyes: . Pupils are equal, round, and reactive to light. Conjunctivae and EOM are normal               Nose: without d/c or deformity               Neck: Neck supple. Gross normal ROM               Cardiovascular: Normal rate and regular rhythm.                 Pulmonary/Chest: Effort normal and breath sounds without rales or wheezing.                Abd:  Soft, NT, ND, + BS, no organomegaly               Neurological: Pt is alert. At baseline orientation,  motor grossly intact  Skin: Skin is warm. No rashes, no other new lesions, LE edema - ***               Psychiatric: Pt behavior is normal without agitation   Micro: none  Cardiac tracings I have personally interpreted today:  none  Pertinent Radiological findings (summarize): none   Lab Results  Component Value Date   WBC 8.0 10/27/2021   HGB 15.4 10/27/2021   HCT 45.2 10/27/2021   PLT 297.0 10/27/2021   GLUCOSE 106 (H) 10/27/2021   CHOL 75 10/27/2021   TRIG 77.0 10/27/2021   HDL 29.30 (L) 10/27/2021   LDLDIRECT 176.2 07/10/2010   LDLCALC 30 10/27/2021   ALT 18 10/27/2021   AST 19 10/27/2021   NA 141 10/27/2021   K 4.4 10/27/2021   CL 108 10/27/2021   CREATININE 1.07 10/27/2021   BUN 19 10/27/2021   CO2 27 10/27/2021   TSH 5.33 10/27/2021   PSA 1.51 10/27/2021   INR 1.54 (H) 05/04/2011   HGBA1C 5.6 10/27/2021   Assessment/Plan:  Samuel Hawkins is a 66 y.o. Other or two or more races [6] male with  has a past medical history of CAD (coronary artery disease) (04/2012), ERECTILE DYSFUNCTION (05/01/2008), Glucose intolerance (impaired glucose tolerance), History of erectile dysfunction, Hyperlipidemia, HYPERLIPIDEMIA (05/01/2008), Hypertension, Impaired glucose tolerance (07/13/2011), and S/P CABG (coronary artery bypass graft) (07/13/2011).  No problem-specific Assessment & Plan notes found for this encounter.  Followup: No follow-ups on file.  Cathlean Cower, MD 11/06/2021 1:58 PM [facility]

## 2021-11-06 NOTE — Patient Instructions (Addendum)
My old office was at [address] and the [facility] is located across the street  Please take all new medication as prescribed - the meloxicam as needed for the feet  Ok to continue the Valtrex for any rash outbreaks you may have, or you can call for a new prescription in the future  Please continue all other medications as before, and refills have been done if requested.  Please have the pharmacy call with any other refills you may need  Please keep your appointments with your specialists as you may have planned

## 2021-11-07 ENCOUNTER — Other Ambulatory Visit: Payer: Self-pay

## 2021-11-07 MED ORDER — LOSARTAN POTASSIUM 50 MG PO TABS: 50.0000 mg | ORAL_TABLET | Freq: Every day | ORAL | 2 refills | Status: DC

## 2021-11-09 ENCOUNTER — Encounter: Payer: Self-pay | Admitting: Internal Medicine

## 2021-11-09 DIAGNOSIS — M722 Plantar fascial fibromatosis: Secondary | ICD-10-CM | POA: Insufficient documentation

## 2021-11-09 NOTE — Assessment & Plan Note (Signed)
Mild, for mobic 15 qd prn, consider f/u with sport medicine if persists or worsens

## 2021-11-10 ENCOUNTER — Other Ambulatory Visit: Payer: Self-pay

## 2021-11-10 DIAGNOSIS — E7800 Pure hypercholesterolemia, unspecified: Secondary | ICD-10-CM

## 2021-11-10 DIAGNOSIS — I25810 Atherosclerosis of coronary artery bypass graft(s) without angina pectoris: Secondary | ICD-10-CM

## 2021-11-10 MED ORDER — REPATHA SURECLICK 140 MG/ML ~~LOC~~ SOAJ: SUBCUTANEOUS | 1 refills | Status: DC

## 2021-11-11 ENCOUNTER — Telehealth: Payer: Self-pay | Admitting: Internal Medicine

## 2021-11-11 NOTE — Telephone Encounter (Signed)
Pt states would like a call back concerning personal health matter

## 2021-11-12 ENCOUNTER — Other Ambulatory Visit: Payer: Self-pay

## 2021-11-12 DIAGNOSIS — E7800 Pure hypercholesterolemia, unspecified: Secondary | ICD-10-CM

## 2021-11-12 DIAGNOSIS — I25810 Atherosclerosis of coronary artery bypass graft(s) without angina pectoris: Secondary | ICD-10-CM

## 2021-11-12 MED ORDER — REPATHA SURECLICK 140 MG/ML ~~LOC~~ SOAJ: SUBCUTANEOUS | 3 refills | Status: DC

## 2021-11-14 NOTE — Telephone Encounter (Signed)
Please disregard message

## 2021-11-25 ENCOUNTER — Other Ambulatory Visit: Payer: Self-pay

## 2021-11-25 DIAGNOSIS — E7800 Pure hypercholesterolemia, unspecified: Secondary | ICD-10-CM

## 2021-11-25 DIAGNOSIS — I25810 Atherosclerosis of coronary artery bypass graft(s) without angina pectoris: Secondary | ICD-10-CM

## 2021-11-25 MED ORDER — REPATHA SURECLICK 140 MG/ML ~~LOC~~ SOAJ: SUBCUTANEOUS | 3 refills | Status: DC

## 2021-11-26 ENCOUNTER — Telehealth: Payer: Self-pay | Admitting: Internal Medicine

## 2021-11-26 NOTE — Telephone Encounter (Signed)
Pt called back,stated only want to talk  to [facility]. Best time to  reach him 1pm-2pm due to his work schedule.  Please Advise

## 2021-11-26 NOTE — Telephone Encounter (Signed)
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  Please call patient

## 2021-11-26 NOTE — Telephone Encounter (Signed)
Left message for patient to call me back. 

## 2021-11-27 NOTE — Telephone Encounter (Signed)
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 Please advise

## 2021-11-28 NOTE — Telephone Encounter (Signed)
Very sorry, but there is no way of determining when the infection occurred by blood testing, only that the infection has occurred at some time in the past (usually at least 1 wk prior to the test being done, but could have been even 10 yrs ago).  sorry

## 2021-11-28 NOTE — Telephone Encounter (Signed)
Patient notified

## 2021-12-11 ENCOUNTER — Ambulatory Visit: Payer: Managed Care, Other (non HMO) | Admitting: Student

## 2022-02-22 ENCOUNTER — Other Ambulatory Visit: Payer: Self-pay | Admitting: Cardiology

## 2022-03-10 ENCOUNTER — Other Ambulatory Visit: Payer: Self-pay

## 2022-03-10 MED ORDER — EZETIMIBE 10 MG PO TABS: 10.0000 mg | ORAL_TABLET | Freq: Every day | ORAL | 3 refills | Status: DC

## 2022-06-16 ENCOUNTER — Other Ambulatory Visit: Payer: Self-pay | Admitting: Cardiology

## 2022-08-11 ENCOUNTER — Telehealth: Payer: Self-pay

## 2022-08-11 ENCOUNTER — Other Ambulatory Visit: Payer: Self-pay

## 2022-08-11 DIAGNOSIS — E7800 Pure hypercholesterolemia, unspecified: Secondary | ICD-10-CM

## 2022-08-11 DIAGNOSIS — I25810 Atherosclerosis of coronary artery bypass graft(s) without angina pectoris: Secondary | ICD-10-CM

## 2022-08-11 MED ORDER — REPATHA SURECLICK 140 MG/ML ~~LOC~~ SOAJ: SUBCUTANEOUS | 3 refills | Status: DC

## 2022-08-11 NOTE — Telephone Encounter (Signed)
Has not taken Repatha in 2 months due to insurance lapse. We are doing another prior authorization. When he is approved can he begin taking it again right away?

## 2022-08-11 NOTE — Telephone Encounter (Signed)
Yes

## 2022-08-11 NOTE — Telephone Encounter (Signed)
Med approved 

## 2022-08-18 ENCOUNTER — Ambulatory Visit: Payer: Self-pay | Admitting: Cardiology

## 2022-08-20 ENCOUNTER — Ambulatory Visit: Payer: Managed Care, Other (non HMO) | Admitting: Cardiology

## 2022-08-20 ENCOUNTER — Ambulatory Visit: Payer: Managed Care, Other (non HMO) | Admitting: Student

## 2022-08-20 NOTE — Progress Notes (Signed)
rescheduled

## 2022-10-14 ENCOUNTER — Ambulatory Visit: Payer: Managed Care, Other (non HMO) | Admitting: Cardiology

## 2022-10-14 ENCOUNTER — Encounter: Payer: Self-pay | Admitting: Cardiology

## 2022-10-14 VITALS — BP 140/82 | HR 57 | Ht 67.0 in | Wt 193.0 lb

## 2022-10-14 DIAGNOSIS — I10 Essential (primary) hypertension: Secondary | ICD-10-CM

## 2022-10-14 DIAGNOSIS — E559 Vitamin D deficiency, unspecified: Secondary | ICD-10-CM

## 2022-10-14 DIAGNOSIS — E7800 Pure hypercholesterolemia, unspecified: Secondary | ICD-10-CM

## 2022-10-14 DIAGNOSIS — I25810 Atherosclerosis of coronary artery bypass graft(s) without angina pectoris: Secondary | ICD-10-CM

## 2022-10-14 MED ORDER — EZETIMIBE 10 MG PO TABS: 10.0000 mg | ORAL_TABLET | Freq: Every evening | ORAL | 3 refills | Status: DC

## 2022-10-14 MED ORDER — REPATHA SURECLICK 140 MG/ML ~~LOC~~ SOAJ: SUBCUTANEOUS | 3 refills | Status: DC

## 2022-10-14 MED ORDER — OLMESARTAN MEDOXOMIL-HCTZ 40-12.5 MG PO TABS: 1.0000 | ORAL_TABLET | ORAL | 3 refills | Status: DC

## 2022-10-14 MED ORDER — VITAMIN D3 75 MCG (3000 UT) PO TABS
1.0000 | ORAL_TABLET | Freq: Every day | ORAL | 3 refills | Status: DC
Start: 2022-10-14 — End: 2023-08-10

## 2022-10-14 MED ORDER — CARVEDILOL 3.125 MG PO TABS: 3.1250 mg | ORAL_TABLET | Freq: Two times a day (BID) | ORAL | 3 refills | Status: DC

## 2022-10-14 NOTE — Progress Notes (Signed)
Primary Physician/Referring:  Corwin Levins, MD  Patient ID: Samuel Hawkins, male    DOB: 11-30-1955, 67 y.o.   MRN: [id number]  Chief Complaint  Patient presents with   Coronary Artery Disease   Follow-up   HPI:    Samuel Hawkins  is a 67 y.o. with pretension, hypercholesterolemia, statin intolerance, coronary artery disease, history of MI in 2012 (CAD with syncope), status post CABG (LIMA-LAD, RIMA-dRCA, other venous grafts occluded), with recurrent syncope sucpicious for ischemia etiology, and angiography revealed high-grade circumflex stenosis S/P PCI on 08/02/2018 (Synergy DES 2.25 X 12 mm to high OM1, Synergy DES 3.5 X 15 mm pLCx).   Patient presents for 1 year follow up.  Patient is feeling well without specific complaints today. He does admit to poor diet. 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 Denies dyspnea, chest pain.   Past Medical History:  Diagnosis Date   CAD (coronary artery disease) 04/2012   Dr. Jacinto Halim: EF 55%; dominant right with diffuse 80% mid vessel disease. Just prior to bifurcation is a 90% stenosis.  D1 90% & LAD 70% after D1 - FFR positive. Large D2 noted.  Proximal circumflex 90% at OM1, ostial OM1 80%. Subtotal OM 2 with TIMI 1 flow (large vessel with several branches).  Ostial ramus 80%.   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   Past Surgical History:  Procedure Laterality Date   CARDIAC CATHETERIZATION     CORONARY ARTERY BYPASS GRAFT  05/04/2011   Procedure: CORONARY ARTERY BYPASS GRAFTING (CABG);  Surgeon: Loreli Slot, MD;  Location: [facility];  Service: Open Heart Surgery;  Laterality: N/A;  CABG x  six;  using bilateral  internal  mammary arteries and left leg greater saphenous vein harvested endoscopically --LIMA-LAD, RIMA-dRCA, SeqSVG-OM3-OM4   CORONARY STENT INTERVENTION N/A 08/02/2018   Procedure: CORONARY STENT INTERVENTION;  Surgeon: Yates Decamp, MD;  Location: [facility];  Service: Cardiovascular;  Laterality: N/A;   LEFT HEART CATH AND CORS/GRAFTS ANGIOGRAPHY N/A 08/02/2018   Procedure: LEFT HEART CATH AND CORS/GRAFTS ANGIOGRAPHY;  Surgeon: Yates Decamp, MD;  Location: [facility];  Service: Cardiovascular;  Laterality: N/A;   LEFT HEART CATHETERIZATION WITH CORONARY ANGIOGRAM N/A 04/30/2011   Procedure: LEFT HEART CATHETERIZATION WITH CORONARY ANGIOGRAM;  Surgeon: Pamella Pert, MD;  Location: [facility]: EF 55%; dominant right with diffuse 80% mid vessel disease. Just prior to bifurcation is a 90% stenosis.  D1 90% & LAD 70% after D1 - FFR positive. Large D2 noted.  Proximal circumflex 90% at OM1, ost OM1 80%. Subtotal OM 2 w/ TIMI 1 flow (large w/ several branches), Ost RI 80%   NM MYOVIEW LTD  11/2012   Stress EKG showed 1.5 mm ST depression at peak exercise recovering less than 2 minutes into recovery. He exercised for 11:52 min. 12.9 METS. Small-moderate-sized inferior-inferolateral transmural scar without infarction. Mild inferior wall hypokinesis. EF 61%. LOW RISK   NM MYOVIEW LTD  08/26/2016   Exercise 11:41 min 12.7 MET S -peak heart rate 162 BPM (90% of max predicted). Normal blood pressure response = excellent exercise capacity..  EF 50%. Medium size, moderate intensity perfusion defect in the basal  inferior-basal lateral and inferolateral wall consistent with prior infarction. No evidence of reversibility/ischemia. (Similar to previous study)   TRANSTHORACIC ECHOCARDIOGRAM  04/2001   EF 55-60%. Mild posterior-inferior hypokinesis. GR 1 DD.   Family History  Problem Relation Age of Onset   Heart attack Brother    Stroke Mother     Social History   Tobacco Use   Smoking status: Former     Types: Cigarettes    Quit date: 05/10/2011    Years since quitting: 11.4   Smokeless tobacco: Never   Tobacco comments:    social smoker only prior to MI  Substance Use Topics   Alcohol use: Not Currently   Marital Status: Divorced  ROS  Review of Systems  Cardiovascular:  Negative for chest pain, dyspnea on exertion and leg swelling.   Objective  Blood pressure (!) 140/82, pulse (!) 57, height 5\' 7"  (1.702 m), weight 193 lb (87.5 kg), SpO2 96 %.     10/14/2022    1:47 PM 10/14/2022    1:14 PM 11/06/2021    1:20 PM  Vitals with BMI  Height  5\' 7"  5\' 7"   Weight  193 lbs 196 lbs  BMI  30.22 30.69  Systolic 140 137 161  Diastolic 82 91 70  Pulse  57 46     Physical Exam Neck:     Vascular: No carotid bruit or JVD.  Cardiovascular:     Rate and Rhythm: Normal rate and regular rhythm.     Pulses: Intact distal pulses.     Heart sounds: Normal heart sounds. No murmur heard.    No gallop.  Pulmonary:     Effort: Pulmonary effort is normal.     Breath sounds: Normal breath sounds.  Abdominal:     General: Bowel sounds are normal.     Palpations: Abdomen is soft.  Musculoskeletal:     Right lower leg: No edema.     Left lower leg: No edema.    Laboratory examination:   Recent Labs    10/27/21 1020  NA 141  K 4.4  CL 108  CO2 27  GLUCOSE 106*  BUN 19  CREATININE 1.07  CALCIUM 8.9    CrCl cannot be calculated (Patient's most recent lab result is older than the maximum 21 days allowed.).     Latest Ref Rng & Units 10/27/2021   10:20 AM 08/22/2021   10:58 AM 02/19/2021    5:19 AM  CMP  Glucose 70 - 99 mg/dL 096  045  409   BUN 6 - 23 mg/dL 19  15  34   Creatinine 0.40 - 1.50 mg/dL 8.11  9.14  7.82   Sodium 135 - 145 mEq/L 141  143  133   Potassium 3.5 - 5.1 mEq/L 4.4  4.5  4.6   Chloride 96 - 112 mEq/L 108  109  105   CO2 19 - 32 mEq/L 27  22  19    Calcium 8.4 - 10.5 mg/dL 8.9  8.9  9.8   Total Protein 6.0 - 8.3 g/dL 6.9     Total Bilirubin 0.2 - 1.2 mg/dL  0.4     Alkaline Phos 39 - 117 U/L 68     AST 0 - 37 U/L 19     ALT 0 - 53 U/L 18         Latest Ref Rng & Units 10/27/2021   10:20 AM 08/22/2021   10:58 AM 02/19/2021    5:19 AM  CBC  WBC 4.0 - 10.5 K/uL 8.0  7.9  15.8   Hemoglobin 13.0 - 17.0 g/dL 16.1  09.6  04.5   Hematocrit 39.0 - 52.0 % 45.2  44.7  53.0   Platelets 150.0 - 400.0 K/uL 297.0  304  323    Lab Results  Component Value Date   CHOL 75 10/27/2021   HDL 29.30 (L) 10/27/2021   LDLCALC 30 10/27/2021   LDLDIRECT 176.2 07/10/2010   TRIG 77.0 10/27/2021   CHOLHDL 3 10/27/2021    Last vitamin D Lab Results  Component Value Date   VD25OH 29.41 (L) 10/27/2021    HEMOGLOBIN A1C Lab Results  Component Value Date   HGBA1C 5.6 10/27/2021   MPG 120 03/09/2016   TSH Recent Labs    10/27/21 1020  TSH 5.33   Radiology:   No results found.  Cardiac Studies:   Coronary angiogram 08/02/2018:  PTCA and complex and prolonged procedure involving stenting of high OM1 with 2.25 x 12 mm Synergy DES, P stenting of the main circumflex proximal branch with 3.5 x 15 mm Synergy DES, stenosis reduced in the OM1 from 80% to 0% with TIMI-3 to TIMI-3 flow and in the main circumflex from 99% to 0% with TIMI-3 to TIMI-3 flow.  There was significant improvement noted through antegrade filling of a large OM 2 which is subtotally occluded and diffusely diseased and also distal PDA branches of codominant circumflex. Mid RCA is occluded.  RIMA to RCA patent. Left main is short.  LAD occluded in the midsegment.  D1 has mild disease in the ostium.  LIMA to LAD is widely patent. Occluded SVG to ramus intermediate and OM1 and SVG to D1.  PCV CARDIAC STRESS TEST 09/26/2021  Narrative Exercise treadmill stress test 09/26/2021: Exercise treadmill stress test performed using Bruce protocol.  Patient reached 11.7 METS, and 86% of age predicted maximum heart rate.  Exercise capacity was excellent.  No chest pain reported.  Normal heart rate and  hemodynamic response. Stress EKG revealed no ischemic changes. Low risk study.    EKG   EKG 10/14/2022: Sinus bradycardia at rate of 51 bpm, otherwise normal EKG.  Single PVC. No significant change from  08/19/2021  Allergies & Medications   Allergies  Allergen Reactions   Atorvastatin Other (See Comments)    muscle cramp   Simvastatin Other (See Comments)    muscle cramp    Current Outpatient Medications:    aspirin 81 MG chewable tablet, Chew 81 mg by mouth daily., Disp: , Rfl:    nitroGLYCERIN (NITROSTAT) 0.4 MG SL tablet, Place 1 tablet (0.4 mg total) under the tongue every 5 (five) minutes as needed for chest pain., Disp: 15 tablet, Rfl: 1   pantoprazole (PROTONIX) 40 MG tablet, TAKE ONE TABLET BY MOUTH DAILY BEFORE BREAKFAST AS NEEDED FOR HEARTBURN, Disp: 90 tablet, Rfl: 3   carvedilol (COREG) 3.125 MG tablet, Take 1 tablet (3.125 mg total) by mouth 2 (two) times daily., Disp: 180 tablet, Rfl: 3   Cholecalciferol (VITAMIN D3) 75 MCG (3000 UT) TABS, Take 1 tablet by mouth daily., Disp: 90 tablet, Rfl: 3   Evolocumab (REPATHA SURECLICK) 140 MG/ML SOAJ, INJECT 140MG  INTO THE SKIN EVERY 14 DAYS, Disp: 6 mL, Rfl: 3   ezetimibe (ZETIA) 10 MG tablet, Take 1 tablet (10 mg total) by mouth every evening., Disp: 90 tablet, Rfl: 3   olmesartan-hydrochlorothiazide (BENICAR HCT) 40-12.5 MG tablet, Take 1 tablet by mouth every morning., Disp: 90 tablet, Rfl: 3  vardenafil (LEVITRA) 20 MG tablet, TAKE ONE TABLET BY MOUTH DAILY AS NEEDED FOR ERECTILE DYSFUNCTION (Patient not taking: Reported on 10/14/2022), Disp: 10 tablet, Rfl: 5   Assessment     ICD-10-CM   1. Coronary artery disease involving coronary bypass graft of native heart without angina pectoris  I25.810 EKG 12-Lead    carvedilol (COREG) 3.125 MG tablet    Evolocumab (REPATHA SURECLICK) 140 MG/ML SOAJ    Lipoprotein A (LPA)    Lipid Panel With LDL/HDL Ratio    COMPLETE METABOLIC PANEL WITH GFR    PCV CARDIAC STRESS TEST    2.  Essential hypertension  I10     3. Hypercholesteremia  E78.00 Evolocumab (REPATHA SURECLICK) 140 MG/ML SOAJ    ezetimibe (ZETIA) 10 MG tablet    Lipoprotein A (LPA)    Lipid Panel With LDL/HDL Ratio    4. Hypovitaminosis D  E55.9 Cholecalciferol (VITAMIN D3) 75 MCG (3000 UT) TABS      Meds ordered this encounter  Medications   carvedilol (COREG) 3.125 MG tablet    Sig: Take 1 tablet (3.125 mg total) by mouth 2 (two) times daily.    Dispense:  180 tablet    Refill:  3   Evolocumab (REPATHA SURECLICK) 140 MG/ML SOAJ    Sig: INJECT 140MG  INTO THE SKIN EVERY 14 DAYS    Dispense:  6 mL    Refill:  3    Approved August 11, 2023   ezetimibe (ZETIA) 10 MG tablet    Sig: Take 1 tablet (10 mg total) by mouth every evening.    Dispense:  90 tablet    Refill:  3   olmesartan-hydrochlorothiazide (BENICAR HCT) 40-12.5 MG tablet    Sig: Take 1 tablet by mouth every morning.    Dispense:  90 tablet    Refill:  3   Cholecalciferol (VITAMIN D3) 75 MCG (3000 UT) TABS    Sig: Take 1 tablet by mouth daily.    Dispense:  90 tablet    Refill:  3    Medications Discontinued During This Encounter  Medication Reason   meloxicam (MOBIC) 15 MG tablet Completed Course   losartan (COZAAR) 50 MG tablet Change in therapy   ezetimibe (ZETIA) 10 MG tablet Reorder   carvedilol (COREG) 3.125 MG tablet Reorder   Evolocumab (REPATHA SURECLICK) 140 MG/ML SOAJ Reorder    Recommendations:   Samuel Hawkins  is a  67 y.o. with pretension, hypercholesterolemia, statin intolerance, coronary artery disease, history of MI in 2012 (CAD with syncope), status post CABG (LIMA-LAD, RIMA-dRCA, other venous grafts occluded), with recurrent syncope sucpicious for ischemia etiology, and angiography revealed high-grade circumflex stenosis S/P PCI on 08/02/2018 (Synergy DES 2.25 X 12 mm to high OM1, Synergy DES 3.5 X 15 mm pLCx).   1. Coronary artery disease involving coronary bypass graft of native heart without angina  pectoris Patient remains asymptomatic without chest pain, he is presently tolerating all his medications well.  He has not used any subnormal nitroglycerin.  No changes in his EKG.  He has had excellent exercise tolerance on his GXT done last year.  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  He is a [employer].  - EKG 12-Lead - carvedilol (COREG) 3.125 MG tablet; Take 1 tablet (3.125 mg total) by mouth 2 (two) times daily.  Dispense: 180 tablet; Refill: 3 - Evolocumab (REPATHA SURECLICK) 140 MG/ML SOAJ; INJECT 140MG  INTO THE SKIN EVERY 14 DAYS  Dispense: 6 mL; Refill: 3 - Lipoprotein A (  LPA) - Lipid Panel With LDL/HDL Ratio - COMPLETE METABOLIC PANEL WITH GFR - PCV CARDIAC STRESS TEST; Future  2. Essential hypertension Blood pressure was elevated today, I have discontinued losartan 50 mg daily and switched him to Benicar HCT 40/12.5 mg in the morning, will obtain labs to 3 weeks after he has been on this medication.  I encouraged him to make an appointment to see Dr. Oliver Barre for complete physical and we can follow-up on his blood pressure at that time as well.  3. Hypercholesteremia His lipids done a year ago were uncontrolled, he is due for complete physical, I will obtain labs for lipids along with Lp(a) and also do CMP. He is present on Repatha, I have refilled his prescriptions. - Evolocumab (REPATHA SURECLICK) 140 MG/ML SOAJ; INJECT 140MG  INTO THE SKIN EVERY 14 DAYS  Dispense: 6 mL; Refill: 3 - ezetimibe (ZETIA) 10 MG tablet; Take 1 tablet (10 mg total) by mouth every evening.  Dispense: 90 tablet; Refill: 3 - Lipoprotein A (LPA) - Lipid Panel With LDL/HDL Ratio  4. Hypovitaminosis D Patient has hypovitaminosis D, mild, I will send in a prescription for vitamin D3 supplement. Otherwise he remained stable from cardiac standpoint, I will see him back in a year or sooner if problems. - Cholecalciferol (VITAMIN D3) 75 MCG (3000 UT) TABS; Take 1 tablet by mouth daily.  Dispense: 90  tablet; Refill: 3  Other orders - olmesartan-hydrochlorothiazide (BENICAR HCT) 40-12.5 MG tablet; Take 1 tablet by mouth every morning.  Dispense: 90 tablet; Refill: 3    Yates Decamp, MD, [facility] 10/14/2022, 2:04 PM Office: [phone] Fax: [phone] Pager: [phone]

## 2022-11-16 ENCOUNTER — Ambulatory Visit: Payer: Managed Care, Other (non HMO) | Admitting: Internal Medicine

## 2022-11-18 LAB — LIPID PANEL WITH LDL/HDL RATIO
Cholesterol, Total: 82 mg/dL — ABNORMAL LOW (ref 100–199)
HDL: 30 mg/dL — ABNORMAL LOW (ref >39)
LDL Chol Calc (NIH): 29 mg/dL (ref 0–99)
LDL/HDL Ratio: 1 ratio (ref 0.0–3.6)
Triglycerides: 127 mg/dL (ref 0–149)
VLDL Cholesterol Cal: 23 mg/dL (ref 5–40)

## 2022-11-18 LAB — LIPOPROTEIN A (LPA): Lipoprotein (a): 77.1 nmol/L — ABNORMAL HIGH (ref <75.0)

## 2022-11-18 NOTE — Progress Notes (Signed)
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

## 2022-12-10 ENCOUNTER — Other Ambulatory Visit: Payer: Self-pay | Admitting: Cardiology

## 2022-12-10 DIAGNOSIS — I25810 Atherosclerosis of coronary artery bypass graft(s) without angina pectoris: Secondary | ICD-10-CM

## 2022-12-10 DIAGNOSIS — E7800 Pure hypercholesterolemia, unspecified: Secondary | ICD-10-CM

## 2023-01-08 ENCOUNTER — Other Ambulatory Visit: Payer: Self-pay | Admitting: Cardiology

## 2023-03-31 ENCOUNTER — Ambulatory Visit: Payer: Managed Care, Other (non HMO) | Admitting: Internal Medicine

## 2023-04-02 ENCOUNTER — Other Ambulatory Visit: Payer: Self-pay | Admitting: Cardiology

## 2023-04-02 MED ORDER — PANTOPRAZOLE SODIUM 40 MG PO TBEC: 40.0000 mg | DELAYED_RELEASE_TABLET | Freq: Every day | ORAL | 1 refills | Status: DC

## 2023-06-10 ENCOUNTER — Ambulatory Visit: Payer: Managed Care, Other (non HMO) | Admitting: Internal Medicine

## 2023-06-18 ENCOUNTER — Ambulatory Visit: Payer: Managed Care, Other (non HMO) | Admitting: Internal Medicine

## 2023-06-25 ENCOUNTER — Telehealth: Payer: Self-pay | Admitting: Cardiology

## 2023-06-25 DIAGNOSIS — I25810 Atherosclerosis of coronary artery bypass graft(s) without angina pectoris: Secondary | ICD-10-CM

## 2023-06-25 DIAGNOSIS — Z0289 Encounter for other administrative examinations: Secondary | ICD-10-CM

## 2023-06-25 NOTE — Telephone Encounter (Signed)
 Yes please CAD without angina. DOT physical

## 2023-06-25 NOTE — Telephone Encounter (Signed)
 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 Can we have these orders placed and them have someone call him for the scheduling process?

## 2023-06-29 ENCOUNTER — Ambulatory Visit: Payer: Managed Care, Other (non HMO) | Admitting: Internal Medicine

## 2023-06-29 NOTE — Telephone Encounter (Signed)
He needs to see [place] first, has been a while before ordering it. He had switched to Bryan Lemma, MD

## 2023-06-29 NOTE — Telephone Encounter (Signed)
Pt calling requesting cb regarding testing

## 2023-06-29 NOTE — Telephone Encounter (Signed)
I spoke with patient.  He will check with provider doing DOT physical to see if exercise tolerance test or nuclear stress test needed.

## 2023-06-29 NOTE — Telephone Encounter (Signed)
I spoke with patient and he reports he needs an echo stress test.  Will review with Dr Jacinto Halim

## 2023-06-30 NOTE — Telephone Encounter (Signed)
Reviewed with Dr Jacinto Halim and OK to order echo and GXT now.  Samuel Hawkins will follow up with Dr Jacinto Halim.  One year follow up scheduled for May 29.  Samuel Hawkins aware to hold Coreg for 24 hours prior to stress test.

## 2023-07-02 NOTE — Addendum Note (Signed)
Addended by: Dossie Arbour on: 07/02/2023 02:42 PM   Modules accepted: Orders

## 2023-07-04 NOTE — Addendum Note (Signed)
Addended by: Delrae Rend on: 07/04/2023 08:04 PM   Modules accepted: Orders

## 2023-07-07 ENCOUNTER — Ambulatory Visit: Payer: Managed Care, Other (non HMO) | Admitting: Internal Medicine

## 2023-07-14 ENCOUNTER — Telehealth: Payer: Self-pay | Admitting: Internal Medicine

## 2023-07-14 NOTE — Telephone Encounter (Unsigned)
 Copied from CRM [id number]. Topic: General - Other >> Jul 14, 2023  3:04 PM Suzette B wrote: Reason for CRM: Patient is requesting a doctor's note 07/08/23 that he had a virus and was unable to be present at work. 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

## 2023-07-15 NOTE — Telephone Encounter (Signed)
 Left detailed message on cell phone, okay per DPR, that we are unable to provide a work note since we have not seen him in the office since June 2023.

## 2023-07-16 ENCOUNTER — Ambulatory Visit: Payer: Managed Care, Other (non HMO) | Admitting: Internal Medicine

## 2023-07-20 ENCOUNTER — Ambulatory Visit (HOSPITAL_COMMUNITY): Payer: Managed Care, Other (non HMO) | Attending: Cardiology

## 2023-07-20 ENCOUNTER — Ambulatory Visit (INDEPENDENT_AMBULATORY_CARE_PROVIDER_SITE_OTHER): Payer: Managed Care, Other (non HMO)

## 2023-07-20 DIAGNOSIS — I25810 Atherosclerosis of coronary artery bypass graft(s) without angina pectoris: Secondary | ICD-10-CM

## 2023-07-20 DIAGNOSIS — Z0289 Encounter for other administrative examinations: Secondary | ICD-10-CM

## 2023-07-20 LAB — EXERCISE TOLERANCE TEST
Angina Index: 0
Duke Treadmill Score: 11
Estimated workload: 12.5
Exercise duration (min): 10 min
Exercise duration (sec): 30 s
MPHR: 155 {beats}/min
Peak HR: 139 {beats}/min
Percent HR: 89 %
RPE: 17
Rest HR: 54 {beats}/min
ST Depression (mm): 0 mm

## 2023-07-20 LAB — ECHOCARDIOGRAM COMPLETE
Area-P 1/2: 4.15 cm2
P 1/2 time: 644 ms
S' Lateral: 3 cm

## 2023-07-20 NOTE — Progress Notes (Signed)
 Normal GXT with excellent exercise tolerance, okay for DOT physical.

## 2023-07-20 NOTE — Telephone Encounter (Signed)
 Copied from CRM [id number]. Topic: General - Other >> Jul 14, 2023  2:37 PM Alvino Blood C wrote: Reason for CRM: Patient states he missed work yesterday and is asking if he can pick up an excuse note to provide to his employer on Thursday 07/15/2023

## 2023-07-20 NOTE — Telephone Encounter (Signed)
 Routing to PCP.  Sent to [facility] by [email or web address].

## 2023-07-20 NOTE — Progress Notes (Signed)
 Normal echocardiogram

## 2023-07-20 NOTE — Telephone Encounter (Signed)
 Ok for note as requested, thanks

## 2023-07-29 ENCOUNTER — Emergency Department (HOSPITAL_BASED_OUTPATIENT_CLINIC_OR_DEPARTMENT_OTHER)
Admission: EM | Admit: 2023-07-29 | Discharge: 2023-07-29 | Disposition: A | Discharge status: Home / Self Care | Attending: Emergency Medicine | Admitting: Emergency Medicine

## 2023-07-29 ENCOUNTER — Encounter (HOSPITAL_BASED_OUTPATIENT_CLINIC_OR_DEPARTMENT_OTHER): Payer: Self-pay | Admitting: Emergency Medicine

## 2023-07-29 ENCOUNTER — Other Ambulatory Visit: Payer: Self-pay

## 2023-07-29 DIAGNOSIS — K529 Noninfective gastroenteritis and colitis, unspecified: Secondary | ICD-10-CM | POA: Insufficient documentation

## 2023-07-29 DIAGNOSIS — R110 Nausea: Secondary | ICD-10-CM | POA: Diagnosis present

## 2023-07-29 LAB — RESP PANEL BY RT-PCR (RSV, FLU A&B, COVID)  RVPGX2
Influenza A by PCR: NEGATIVE
Influenza B by PCR: NEGATIVE
Resp Syncytial Virus by PCR: NEGATIVE
SARS Coronavirus 2 by RT PCR: NEGATIVE

## 2023-07-29 NOTE — Discharge Instructions (Addendum)
 Would recommend small amounts of liquids with some sugar in them and then once you are tolerating that then advance to bland diet.  Work note provided.  Return for any new or worse symptoms.

## 2023-07-29 NOTE — ED Triage Notes (Signed)
 Pt reports nausea, diarrhea, tiredness & body aches, chills since last night; denies fever, cough, congestion, & vomiting

## 2023-07-29 NOTE — ED Provider Notes (Signed)
 [facility] Provider Note   CSN: [id number] Arrival date & time: 07/29/23  1728     History  Chief Complaint  Patient presents with   Nausea   Diarrhea    Samuel Hawkins is a 68 y.o. male.  Patient had acute onset about a in the morning of nausea a little bit of dry heaves but never did vomit and diarrhea no blood in the diarrhea had some chills and some bodyaches fatigue.  No respiratory symptoms.  Did not feel like he had a fever but temp here was 100.2 on arrival temp at now is 99.6.  Patient states he feels much better now its gotten into the evening hours he feels tremendously better.  Oxygen saturation on room air is 97% blood pressure was 103/87 else 122/66 respirations were 16.  Heart rate currently 88 it was 102 when he first arrived.  Past medical history significant for hyperlipidemia glucose intolerance coronary artery disease hyperlipidemia coronary bypass graft surgery in 2012.  Patient has not had any chest pain with this.  Patient did have a heart catheterization in 2020 with stents.  Patient is a former smoker quit in 2012.       Home Medications Prior to Admission medications   Medication Sig Start Date End Date Taking? Authorizing Provider  aspirin 81 MG chewable tablet Chew 81 mg by mouth daily.    [provider]  carvedilol (COREG) 3.125 MG tablet Take 1 tablet (3.125 mg total) by mouth 2 (two) times daily. 10/14/22   Yates Decamp, MD  Cholecalciferol (VITAMIN D3) 75 MCG (3000 UT) TABS Take 1 tablet by mouth daily. 10/14/22   Yates Decamp, MD  ezetimibe (ZETIA) 10 MG tablet Take 1 tablet (10 mg total) by mouth every evening. 10/14/22   Yates Decamp, MD  nitroGLYCERIN (NITROSTAT) 0.4 MG SL tablet Place 1 tablet (0.4 mg total) under the tongue every 5 (five) minutes as needed for chest pain. 08/04/18   Yates Decamp, MD  olmesartan-hydrochlorothiazide (BENICAR HCT) 40-12.5 MG tablet Take 1 tablet by mouth every morning.  10/14/22   Yates Decamp, MD  pantoprazole (PROTONIX) 40 MG tablet Take 1 tablet (40 mg total) by mouth daily. TAKE ONE TABLET BY MOUTH DAILY BEFORE BREAKFAST AS NEEDED FOR HEARTBURN 04/02/23   Yates Decamp, MD  REPATHA SURECLICK 140 MG/ML SOAJ INJECT 140MG  INTO THE SKIN EVERY 14 DAYS 12/10/22   Yates Decamp, MD  vardenafil (LEVITRA) 20 MG tablet TAKE ONE TABLET BY MOUTH DAILY AS NEEDED FOR ERECTILE DYSFUNCTION Patient not taking: Reported on 10/14/2022 03/08/21   Corwin Levins, MD      Allergies    Atorvastatin and Simvastatin    Review of Systems   Review of Systems  Constitutional:  Positive for chills and fever.  HENT:  Negative for ear pain and sore throat.   Eyes:  Negative for pain and visual disturbance.  Respiratory:  Negative for cough and shortness of breath.   Cardiovascular:  Negative for chest pain and palpitations.  Gastrointestinal:  Positive for diarrhea and nausea. Negative for abdominal pain and vomiting.  Genitourinary:  Negative for dysuria and hematuria.  Musculoskeletal:  Positive for myalgias. Negative for arthralgias and back pain.  Skin:  Negative for color change and rash.  Neurological:  Negative for seizures and syncope.  All other systems reviewed and are negative.   Physical Exam Updated Vital Signs BP 122/66   Pulse 88   Temp 99.6 F (37.6 C) (Oral)  Resp 18   Ht 1.702 m (5\' 7" )   Wt 88.5 kg   SpO2 95%   BMI 30.54 kg/m  Physical Exam Vitals and nursing note reviewed.  Constitutional:      General: He is not in acute distress.    Appearance: He is well-developed.  HENT:     Head: Normocephalic and atraumatic.     Mouth/Throat:     Mouth: Mucous membranes are moist.  Eyes:     Conjunctiva/sclera: Conjunctivae normal.  Cardiovascular:     Rate and Rhythm: Normal rate and regular rhythm.     Heart sounds: No murmur heard. Pulmonary:     Effort: Pulmonary effort is normal. No respiratory distress.     Breath sounds: Normal breath sounds.   Abdominal:     Palpations: Abdomen is soft.     Tenderness: There is no abdominal tenderness.  Musculoskeletal:        General: No swelling.     Cervical back: Neck supple.  Skin:    General: Skin is warm and dry.     Capillary Refill: Capillary refill takes less than 2 seconds.  Neurological:     General: No focal deficit present.     Mental Status: He is alert and oriented to person, place, and time.  Psychiatric:        Mood and Affect: Mood normal.     ED Results / Procedures / Treatments   Labs (all labs ordered are listed, but only abnormal results are displayed) Labs Reviewed  RESP PANEL BY RT-PCR (RSV, FLU A&B, COVID)  [id number]    EKG None  Radiology No results found.  Procedures Procedures    Medications Ordered in ED Medications - No data to display  ED Course/ Medical Decision Making/ A&P                                 Medical Decision Making  Patient now feeling much better.  Most likely has gotten through this gastroenteritis although he had diarrhea and dry heaves but no vomiting.  Respiratory panel was done that was negative but this does not seem to be consistent with a flulike illness.  Seems to be consistent with gastroenteritis.  Patient ready to try small amounts of liquids and advance to bland diet.   Final Clinical Impression(s) / ED Diagnoses Final diagnoses:  Gastroenteritis    Rx / DC Orders ED Discharge Orders     None         Vanetta Mulders, MD 07/29/23 2101

## 2023-08-09 ENCOUNTER — Encounter: Payer: Self-pay | Admitting: Internal Medicine

## 2023-08-09 ENCOUNTER — Encounter (HOSPITAL_COMMUNITY): Payer: Self-pay | Admitting: Internal Medicine

## 2023-08-09 ENCOUNTER — Inpatient Hospital Stay (HOSPITAL_COMMUNITY)
Admission: EM | Admit: 2023-08-09 | Discharge: 2023-08-11 | DRG: 683 | Disposition: A | Discharge status: Home / Self Care | Attending: Internal Medicine | Admitting: Internal Medicine

## 2023-08-09 ENCOUNTER — Other Ambulatory Visit: Payer: Self-pay

## 2023-08-09 ENCOUNTER — Emergency Department (HOSPITAL_COMMUNITY)

## 2023-08-09 ENCOUNTER — Ambulatory Visit: Admitting: Internal Medicine

## 2023-08-09 VITALS — BP 110/70 | HR 70 | Temp 98.7°F | Ht 67.0 in | Wt 189.0 lb

## 2023-08-09 DIAGNOSIS — E559 Vitamin D deficiency, unspecified: Secondary | ICD-10-CM | POA: Diagnosis not present

## 2023-08-09 DIAGNOSIS — R197 Diarrhea, unspecified: Secondary | ICD-10-CM | POA: Diagnosis not present

## 2023-08-09 DIAGNOSIS — E7800 Pure hypercholesterolemia, unspecified: Secondary | ICD-10-CM | POA: Diagnosis present

## 2023-08-09 DIAGNOSIS — Z7982 Long term (current) use of aspirin: Secondary | ICD-10-CM

## 2023-08-09 DIAGNOSIS — K30 Functional dyspepsia: Secondary | ICD-10-CM | POA: Diagnosis present

## 2023-08-09 DIAGNOSIS — I10 Essential (primary) hypertension: Secondary | ICD-10-CM | POA: Diagnosis not present

## 2023-08-09 DIAGNOSIS — Z8249 Family history of ischemic heart disease and other diseases of the circulatory system: Secondary | ICD-10-CM | POA: Diagnosis not present

## 2023-08-09 DIAGNOSIS — R252 Cramp and spasm: Secondary | ICD-10-CM | POA: Diagnosis not present

## 2023-08-09 DIAGNOSIS — R198 Other specified symptoms and signs involving the digestive system and abdomen: Secondary | ICD-10-CM | POA: Diagnosis not present

## 2023-08-09 DIAGNOSIS — Z955 Presence of coronary angioplasty implant and graft: Secondary | ICD-10-CM | POA: Diagnosis not present

## 2023-08-09 DIAGNOSIS — Z79899 Other long term (current) drug therapy: Secondary | ICD-10-CM | POA: Diagnosis not present

## 2023-08-09 DIAGNOSIS — Z951 Presence of aortocoronary bypass graft: Secondary | ICD-10-CM | POA: Diagnosis not present

## 2023-08-09 DIAGNOSIS — Z888 Allergy status to other drugs, medicaments and biological substances status: Secondary | ICD-10-CM

## 2023-08-09 DIAGNOSIS — R7302 Impaired glucose tolerance (oral): Secondary | ICD-10-CM | POA: Diagnosis not present

## 2023-08-09 DIAGNOSIS — Z823 Family history of stroke: Secondary | ICD-10-CM

## 2023-08-09 DIAGNOSIS — E860 Dehydration: Secondary | ICD-10-CM | POA: Diagnosis present

## 2023-08-09 DIAGNOSIS — Z87891 Personal history of nicotine dependence: Secondary | ICD-10-CM

## 2023-08-09 DIAGNOSIS — I5042 Chronic combined systolic (congestive) and diastolic (congestive) heart failure: Secondary | ICD-10-CM | POA: Diagnosis present

## 2023-08-09 DIAGNOSIS — G4700 Insomnia, unspecified: Secondary | ICD-10-CM | POA: Diagnosis not present

## 2023-08-09 DIAGNOSIS — I2510 Atherosclerotic heart disease of native coronary artery without angina pectoris: Secondary | ICD-10-CM | POA: Diagnosis present

## 2023-08-09 DIAGNOSIS — I25810 Atherosclerosis of coronary artery bypass graft(s) without angina pectoris: Secondary | ICD-10-CM

## 2023-08-09 DIAGNOSIS — I110 Hypertensive heart disease with heart failure: Secondary | ICD-10-CM | POA: Diagnosis present

## 2023-08-09 DIAGNOSIS — R112 Nausea with vomiting, unspecified: Secondary | ICD-10-CM

## 2023-08-09 DIAGNOSIS — N179 Acute kidney failure, unspecified: Secondary | ICD-10-CM | POA: Diagnosis present

## 2023-08-09 DIAGNOSIS — K219 Gastro-esophageal reflux disease without esophagitis: Secondary | ICD-10-CM | POA: Insufficient documentation

## 2023-08-09 DIAGNOSIS — A084 Viral intestinal infection, unspecified: Secondary | ICD-10-CM | POA: Diagnosis present

## 2023-08-09 DIAGNOSIS — R571 Hypovolemic shock: Secondary | ICD-10-CM | POA: Diagnosis present

## 2023-08-09 HISTORY — DX: Nausea with vomiting, unspecified: R11.2

## 2023-08-09 LAB — CK: Total CK: 237 U/L (ref 49–397)

## 2023-08-09 LAB — COMPREHENSIVE METABOLIC PANEL
ALT: 23 U/L (ref 0–44)
AST: 20 U/L (ref 15–41)
Albumin: 4.2 g/dL (ref 3.5–5.0)
Alkaline Phosphatase: 60 U/L (ref 38–126)
Anion gap: 11 (ref 5–15)
BUN: 37 mg/dL — ABNORMAL HIGH (ref 8–23)
CO2: 20 mmol/L — ABNORMAL LOW (ref 22–32)
Calcium: 9.6 mg/dL (ref 8.9–10.3)
Chloride: 105 mmol/L (ref 98–111)
Creatinine, Ser: 4.62 mg/dL — ABNORMAL HIGH (ref 0.61–1.24)
GFR, Estimated: 13 mL/min — ABNORMAL LOW (ref >60)
Glucose, Bld: 122 mg/dL — ABNORMAL HIGH (ref 70–99)
Potassium: 4.8 mmol/L (ref 3.5–5.1)
Sodium: 136 mmol/L (ref 135–145)
Total Bilirubin: 1 mg/dL (ref 0.0–1.2)
Total Protein: 8.6 g/dL — ABNORMAL HIGH (ref 6.5–8.1)

## 2023-08-09 LAB — PHOSPHORUS: Phosphorus: 4.2 mg/dL (ref 2.5–4.6)

## 2023-08-09 LAB — CBC WITH DIFFERENTIAL/PLATELET
Abs Immature Granulocytes: 0.11 10*3/uL — ABNORMAL HIGH (ref 0.00–0.07)
Basophils Absolute: 0.1 10*3/uL (ref 0.0–0.1)
Basophils Relative: 0 %
Eosinophils Absolute: 0.1 10*3/uL (ref 0.0–0.5)
Eosinophils Relative: 1 %
HCT: 50 % (ref 39.0–52.0)
Hemoglobin: 16.8 g/dL (ref 13.0–17.0)
Immature Granulocytes: 1 %
Lymphocytes Relative: 15 %
Lymphs Abs: 2.8 10*3/uL (ref 0.7–4.0)
MCH: 30.6 pg (ref 26.0–34.0)
MCHC: 33.6 g/dL (ref 30.0–36.0)
MCV: 91.1 fL (ref 80.0–100.0)
Monocytes Absolute: 1.5 10*3/uL — ABNORMAL HIGH (ref 0.1–1.0)
Monocytes Relative: 8 %
Neutro Abs: 13.5 10*3/uL — ABNORMAL HIGH (ref 1.7–7.7)
Neutrophils Relative %: 75 %
Platelets: 482 10*3/uL — ABNORMAL HIGH (ref 150–400)
RBC: 5.49 MIL/uL (ref 4.22–5.81)
RDW: 13.5 % (ref 11.5–15.5)
WBC: 18 10*3/uL — ABNORMAL HIGH (ref 4.0–10.5)
nRBC: 0 % (ref 0.0–0.2)

## 2023-08-09 LAB — HEPATIC FUNCTION PANEL
ALT: 17 U/L (ref 0–44)
AST: 16 U/L (ref 15–41)
Albumin: 3.2 g/dL — ABNORMAL LOW (ref 3.5–5.0)
Alkaline Phosphatase: 46 U/L (ref 38–126)
Bilirubin, Direct: <0.1 mg/dL (ref 0.0–0.2)
Total Bilirubin: 0.6 mg/dL (ref 0.0–1.2)
Total Protein: 6.7 g/dL (ref 6.5–8.1)

## 2023-08-09 LAB — POC OCCULT BLOOD, ED: Fecal Occult Bld: NEGATIVE

## 2023-08-09 LAB — PROCALCITONIN: Procalcitonin: 0.12 ng/mL

## 2023-08-09 LAB — PROTIME-INR
INR: 1.1 (ref 0.8–1.2)
Prothrombin Time: 13.9 s (ref 11.4–15.2)

## 2023-08-09 LAB — OSMOLALITY: Osmolality: 307 mosm/kg — ABNORMAL HIGH (ref 275–295)

## 2023-08-09 LAB — LIPASE, BLOOD: Lipase: 41 U/L (ref 11–51)

## 2023-08-09 LAB — TSH: TSH: 1.188 u[IU]/mL (ref 0.350–4.500)

## 2023-08-09 LAB — LACTIC ACID, PLASMA: Lactic Acid, Venous: 1.1 mmol/L (ref 0.5–1.9)

## 2023-08-09 LAB — MAGNESIUM: Magnesium: 2.1 mg/dL (ref 1.7–2.4)

## 2023-08-09 MED ORDER — PANTOPRAZOLE SODIUM 40 MG IV SOLR
40.0000 mg | Freq: Once | INTRAVENOUS | Status: AC
  Administered 2023-08-09: 40 mg via INTRAVENOUS
  Filled 2023-08-09: qty 10

## 2023-08-09 MED ORDER — ONDANSETRON HCL 4 MG/2ML IJ SOLN
4.0000 mg | Freq: Once | INTRAMUSCULAR | Status: AC
  Administered 2023-08-09: 4 mg via INTRAMUSCULAR

## 2023-08-09 MED ORDER — LACTATED RINGERS IV BOLUS
1000.0000 mL | Freq: Once | INTRAVENOUS | Status: AC
  Administered 2023-08-09: 1000 mL via INTRAVENOUS

## 2023-08-09 MED ORDER — LACTATED RINGERS IV BOLUS
1000.0000 mL | Freq: Once | INTRAVENOUS | Status: AC
Start: 2023-08-09 — End: 2023-08-09
  Administered 2023-08-09: 1000 mL via INTRAVENOUS

## 2023-08-09 MED ORDER — ONDANSETRON HCL 4 MG/2ML IJ SOLN
4.0000 mg | Freq: Once | INTRAMUSCULAR | Status: AC
  Administered 2023-08-09: 4 mg via INTRAVENOUS
  Filled 2023-08-09: qty 2

## 2023-08-09 NOTE — Patient Instructions (Addendum)
 You had the nausea shot today  Please also go to the [facility] Emergency Room as you have dehydration and unable to take fluids  Ok to hold on taking the Benicar HCT  Please continue all other medications as before, and refills have been done if requested.  Please have the pharmacy call with any other refills you may need.  Please keep your appointments with your specialists as you may have planned

## 2023-08-09 NOTE — ED Triage Notes (Signed)
 Vomiting and diarrhea for 2 days, is now disoriented, also having muscle cramps. Was seen at [facility] for same about 10 days ago

## 2023-08-09 NOTE — Assessment & Plan Note (Signed)
 Likely should add Magnesium to labs in ED please

## 2023-08-09 NOTE — Assessment & Plan Note (Signed)
 BP Readings from Last 3 Encounters:  08/09/23 110/70  07/29/23 122/66  10/14/22 (!) 140/82   Low normal, pt to continue medical treatment coreg 3.125 but hold the benicar HCT

## 2023-08-09 NOTE — H&P (Signed)
 Samuel Hawkins ZOX:[id number] DOB: May 11, 1956 DOA: 08/09/2023     PCP: Corwin Levins, MD   Outpatient Specialists:   CARDS: Dr. Jacinto Halim   Patient arrived to ER on 08/09/23 at 1709 Referred by Attending Loetta Rough, MD   Patient coming from:    home Lives alone,      Chief Complaint:   Chief Complaint  Patient presents with   Emesis    HPI: Samuel Hawkins is a 68 y.o. male with medical history significant of CAD sp CABG, HLD, HTN,     Presented with  nausea vomiting diarrhea  Nv D for 1 wk Unable to tolerate PO no Abd pain  10 bm a day  Muscle aches  No fever  some chills  No bleeding no melena No mArejuana No abx no travel no sick contacts  He thinks he ate wrong food at the party  No fever no chills feel much better Denies significant ETOH intake   Does not smoke   Lab Results  Component Value Date   SARSCOV2NAA NEGATIVE 07/29/2023    Regarding pertinent Chronic problems:     Hyperlipidemia -  on zetia Lipid Panel     Component Value Date/Time   CHOL 82 (L) 11/16/2022 1044   TRIG 127 11/16/2022 1044   TRIG 240 (HH) 04/30/2006 0816   HDL 30 (L) 11/16/2022 1044   CHOLHDL 3 10/27/2021 1020   VLDL 15.4 10/27/2021 1020   LDLCALC 29 11/16/2022 1044   LDLDIRECT 176.2 07/10/2010 1146   LABVLDL 23 11/16/2022 1044     HTN on coreg, Benicar   chronic CHF diastolic/systolic/ combined - last echo  Recent Results (from the past 81191 hours)  ECHOCARDIOGRAM COMPLETE   Collection Time: 07/20/23 10:16 AM  Result Value   Area-P 1/2 4.15   S' Lateral 3.00   P 1/2 time 644   Est EF 60 - 65%   Narrative      ECHOCARDIOGRAM REPORT        IMPRESSIONS    1. Left ventricular ejection fraction, by estimation, is 60 to 65%. The left ventricle has normal function. The left ventricle has no regional wall motion abnormalities. Left ventricular diastolic parameters are indeterminate.  2. Right ventricular systolic function is normal. The right ventricular size is  normal.  3. The mitral valve is normal in structure. Mild mitral valve regurgitation. No evidence of mitral stenosis.  4. The aortic valve is tricuspid. Aortic valve regurgitation is mild. Aortic valve sclerosis is present, with no evidence of aortic valve stenosis.  5. The inferior vena cava is normal in size with greater than 50% respiratory variability, suggesting right atrial pressure of 3 mmHg.            CAD  - On Aspirin,   betablocker,                 -  followed by cardiology     While in ER: Clinical Course as of 08/09/23 2138  Mon Aug 09, 2023  1905 CK Total: 237 No rhabdo [HN]  1905 Lipase: 41 neg [HN]  1905 WBC(!): 18.0 +leukocytosis w/ left shift [HN]  1905 Creatinine(!): 4.62 +AKI up from BL ~1 [HN]  1905 BUN(!): 37 Elevated BUN [HN]  1906 BP improved to 100/60s w/ one liter of fluids. Will resuscitate with a 2nd liter as well.  [HN]  1955 Fecal Occult Blood, POC: NEGATIVE [HN]  2120 CT ABDOMEN PELVIS WO CONTRAST Diverticular change of the colon  without evidence of diverticulitis.  No findings to correspond with the patient's given clinical history are noted.   [HN]    Clinical Course User Index [HN] Loetta Rough, MD       Lab Orders         C Difficile Quick Screen w PCR reflex         Gastrointestinal Panel by PCR , Stool         Comprehensive metabolic panel         Lipase, blood         CBC with Diff         Urinalysis, Routine w reflex microscopic -Urine, Clean Catch         CK         POC occult blood, ED RN will collect      CTabd/pelvis -  nonacute    Following Medications were ordered in ER: Medications  lactated ringers bolus 1,000 mL (0 mLs Intravenous Stopped 08/09/23 2034)  ondansetron (ZOFRAN) injection 4 mg (4 mg Intravenous Given 08/09/23 1925)  pantoprazole (PROTONIX) injection 40 mg (40 mg Intravenous Given 08/09/23 1925)  lactated ringers bolus 1,000 mL (0 mLs Intravenous Stopped 08/09/23 2034)       ED Triage Vitals  [08/09/23 1728]  Encounter Vitals Group     BP (!) 87/64     Systolic BP Percentile      Diastolic BP Percentile      Pulse Rate 62     Resp 16     Temp 98.3 F (36.8 C)     Temp Source Oral     SpO2 98 %     Weight      Height      Head Circumference      Peak Flow      Pain Score      Pain Loc      Pain Education      Exclude from Growth Chart   TMAX(24)@     _________________________________________ Significant initial  Findings: Abnormal Labs Reviewed  COMPREHENSIVE METABOLIC PANEL - Abnormal; Notable for the following components:      Result Value   CO2 20 (*)    Glucose, Bld 122 (*)    BUN 37 (*)    Creatinine, Ser 4.62 (*)    Total Protein 8.6 (*)    GFR, Estimated 13 (*)    All other components within normal limits  CBC WITH DIFFERENTIAL/PLATELET - Abnormal; Notable for the following components:   WBC 18.0 (*)    Platelets 482 (*)    Neutro Abs 13.5 (*)    Monocytes Absolute 1.5 (*)    Abs Immature Granulocytes 0.11 (*)    All other components within normal limits     Cardiac Panel (last 3 results) Recent Labs    08/09/23 1731  CKTOTAL 237    ECG: Ordered Personally reviewed and interpreted by me showing: HR : 56 Rhythm:Sinus rhythm Nonspecific intraventricular conduction delay Inferior infarct, old QTC 420     COVID-19 Labs  No results for input(s): "DDIMER", "FERRITIN", "LDH", "CRP" in the last 72 hours.  Lab Results  Component Value Date   SARSCOV2NAA NEGATIVE 07/29/2023      The recent clinical data is shown below. Vitals:   08/09/23 1730 08/09/23 1900 08/09/23 2000 08/09/23 2100  BP: (!) 87/64 100/66 126/73 116/64  Pulse: 71 (!) 55 (!) 54 (!) 57  Resp:  (!) 24 16 18   Temp:  TempSrc:      SpO2: 97% 96% 100% 98%    WBC     Component Value Date/Time   WBC 18.0 (H) 08/09/2023 1731   LYMPHSABS 2.8 08/09/2023 1731   MONOABS 1.5 (H) 08/09/2023 1731   EOSABS 0.1 08/09/2023 1731   BASOSABS 0.1 08/09/2023 1731     Lactic  Acid, Venous    Component Value Date/Time   LATICACIDVEN 1.1 08/09/2023 2142    Procalcitonin   Ordered      UA   ordered         __________________________________________________________ Recent Labs  Lab 08/09/23 1731 08/09/23 2143  NA 136  --   K 4.8  --   CO2 20*  --   GLUCOSE 122*  --   BUN 37*  --   CREATININE 4.62*  --   CALCIUM 9.6  --   MG  --  2.1  PHOS  --  4.2    Cr  Up from baseline see below Lab Results  Component Value Date   CREATININE 4.62 (H) 08/09/2023   CREATININE 1.07 10/27/2021   CREATININE 1.10 08/22/2021    Recent Labs  Lab 08/09/23 1731  AST 20  ALT 23  ALKPHOS 60  BILITOT 1.0  PROT 8.6*  ALBUMIN 4.2   Lab Results  Component Value Date   CALCIUM 9.6 08/09/2023    Plt: Lab Results  Component Value Date   PLT 482 (H) 08/09/2023    Recent Labs  Lab 08/09/23 1731  WBC 18.0*  NEUTROABS 13.5*  HGB 16.8  HCT 50.0  MCV 91.1  PLT 482*    HG/HCT   stable,       Component Value Date/Time   HGB 16.8 08/09/2023 1731   HGB 15.6 08/22/2021 1058   HCT 50.0 08/09/2023 1731   HCT 44.7 08/22/2021 1058   MCV 91.1 08/09/2023 1731   MCV 89 08/22/2021 1058     Recent Labs  Lab 08/09/23 1731  LIPASE 41     _______________________________________________ Hospitalist was called for admission for   AKI  Nausea vomiting and diarrhea  Dehydration      The following Work up has been ordered so far:  Orders Placed This Encounter  Procedures   C Difficile Quick Screen w PCR reflex   Gastrointestinal Panel by PCR , Stool   CT ABDOMEN PELVIS WO CONTRAST   Comprehensive metabolic panel   Lipase, blood   CBC with Diff   Urinalysis, Routine w reflex microscopic -Urine, Clean Catch   CK   Diet NPO time specified   Initiate Carrier Fluid Protocol   Consult to hospitalist   Enteric precautions (UV disinfection) C difficile, Norovirus   POC occult blood, ED RN will collect   ED EKG   Insert peripheral IV     OTHER  Significant initial  Findings:  labs showing:     DM  labs:  HbA1C: No results for input(s): "HGBA1C" in the last 8760 hours.     CBG (last 3)  No results for input(s): "GLUCAP" in the last 72 hours.        Cultures: No results found for: "SDES", "SPECREQUEST", "CULT", "REPTSTATUS"   Radiological Exams on Admission: CT ABDOMEN PELVIS WO CONTRAST Result Date: 08/09/2023 CLINICAL DATA:  Nausea and vomiting for 2 days, initial encounter EXAM: CT ABDOMEN AND PELVIS WITHOUT CONTRAST TECHNIQUE: Multidetector CT imaging of the abdomen and pelvis was performed following the standard protocol without IV contrast. RADIATION DOSE REDUCTION: This exam was  performed according to the departmental dose-optimization program which includes automated exposure control, adjustment of the mA and/or kV according to patient size and/or use of iterative reconstruction technique. COMPARISON:  09/04/2015 FINDINGS: Lower chest: Lung bases demonstrate minimal scarring. No focal infiltrate is seen. Hepatobiliary: No focal liver abnormality is seen. No gallstones, gallbladder wall thickening, or biliary dilatation. Pancreas: Unremarkable. No pancreatic ductal dilatation or surrounding inflammatory changes. Spleen: Normal in size without focal abnormality. Adrenals/Urinary Tract: Adrenal glands are within normal limits. Kidneys are well visualized bilaterally. Simple cysts are again identified bilaterally stable from the prior exam. No follow-up is recommended. No renal calculi or obstructive changes are seen. The bladder is within normal limits. Stomach/Bowel: Scattered diverticular change of the colon is noted without evidence of diverticulitis. The appendix is within normal limits. Small bowel and stomach are unremarkable. Vascular/Lymphatic: Aortic atherosclerosis. No enlarged abdominal or pelvic lymph nodes. Reproductive: Prostate is unremarkable. Other: No abdominal wall hernia or abnormality. No abdominopelvic ascites.  Musculoskeletal: No acute or significant osseous findings. 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 No findings to correspond with the patient's given clinical history are noted. Electronically Signed   By: Alcide Clever M.D.   On: 08/09/2023 21:17   _______________________________________________________________________________________________________ Latest  Blood pressure 116/64, pulse (!) 57, temperature 98.3 F (36.8 C), temperature source Oral, resp. rate 18, SpO2 98%.   Vitals  labs and radiology finding personally reviewed  Review of Systems:    Pertinent positives include:  , abdominal pain, nausea, vomiting, diarrhea,  Constitutional:  No weight loss, night sweats, Fevers, chills, fatigue, weight loss  HEENT:  No headaches, Difficulty swallowing,Tooth/dental problems,Sore throat,  No sneezing, itching, ear ache, nasal congestion, post nasal drip,  Cardio-vascular:  No chest pain, Orthopnea, PND, anasarca, dizziness, palpitations.no Bilateral lower extremity swelling  GI:  No heartburn, indigestion change in bowel habits, loss of appetite, melena, blood in stool, hematemesis Resp:  no shortness of breath at rest. No dyspnea on exertion, No excess mucus, no productive cough, No non-productive cough, No coughing up of blood.No change in color of mucus.No wheezing. Skin:  no rash or lesions. No jaundice GU:  no dysuria, change in color of urine, no urgency or frequency. No straining to urinate.  No flank pain.  Musculoskeletal:  No joint pain or no joint swelling. No decreased range of motion. No back pain.  Psych:  No change in mood or affect. No depression or anxiety. No memory loss.  Neuro: no localizing neurological complaints, no tingling, no weakness, no double vision, no gait abnormality, no slurred speech, no confusion  All systems reviewed and apart from HOPI all are  negative _______________________________________________________________________________________________ Past Medical History:   Past Medical History:  Diagnosis Date   CAD (coronary artery disease) 04/2012   Dr. Jacinto Halim: EF 55%; dominant right with diffuse 80% mid vessel disease. Just prior to bifurcation is a 90% stenosis.  D1 90% & LAD 70% after D1 - FFR positive. Large D2 noted.  Proximal circumflex 90% at OM1, ostial OM1 80%. Subtotal OM 2 with TIMI 1 flow (large vessel with several branches).  Ostial ramus 80%.   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      Past Surgical History:  Procedure Laterality Date   CARDIAC CATHETERIZATION     CORONARY ARTERY BYPASS GRAFT  05/04/2011   Procedure: CORONARY ARTERY BYPASS GRAFTING (CABG);  Surgeon: Loreli Slot, MD;  Location: [facility] OR;  Service: Open Heart Surgery;  Laterality: N/A;  CABG x  six;  using bilateral  internal mammary arteries and left leg greater saphenous vein harvested endoscopically --LIMA-LAD, RIMA-dRCA, SeqSVG-OM3-OM4   CORONARY STENT INTERVENTION N/A 08/02/2018   Procedure: CORONARY STENT INTERVENTION;  Surgeon: Yates Decamp, MD;  Location: [facility] INVASIVE CV LAB;  Service: Cardiovascular;  Laterality: N/A;   LEFT HEART CATH AND CORS/GRAFTS ANGIOGRAPHY N/A 08/02/2018   Procedure: LEFT HEART CATH AND CORS/GRAFTS ANGIOGRAPHY;  Surgeon: Yates Decamp, MD;  Location: [facility] INVASIVE CV LAB;  Service: Cardiovascular;  Laterality: N/A;   LEFT HEART CATHETERIZATION WITH CORONARY ANGIOGRAM N/A 04/30/2011   Procedure: LEFT HEART CATHETERIZATION WITH CORONARY ANGIOGRAM;  Surgeon: Pamella Pert, MD;  Location: [facility] CATH LAB: EF 55%; dominant right with diffuse  80% mid vessel disease. Just prior to bifurcation is a 90% stenosis.  D1 90% & LAD 70% after D1 - FFR positive. Large D2 noted.  Proximal circumflex 90% at OM1, ost OM1 80%. Subtotal OM 2 w/ TIMI 1 flow (large w/ several branches), Ost RI 80%   NM MYOVIEW LTD  11/2012   Stress EKG showed 1.5 mm ST depression at peak exercise recovering less than 2 minutes into recovery. He exercised for 11:52 min. 12.9 METS. Small-moderate-sized inferior-inferolateral transmural scar without infarction. Mild inferior wall hypokinesis. EF 61%. LOW RISK   NM MYOVIEW LTD  08/26/2016   Exercise 11:41 min 12.7 MET S -peak heart rate 162 BPM (90% of max predicted). Normal blood pressure response = excellent exercise capacity..  EF 50%. Medium size, moderate intensity perfusion defect in the basal inferior-basal lateral and inferolateral wall consistent with prior infarction. No evidence of reversibility/ischemia. (Similar to previous study)   TRANSTHORACIC ECHOCARDIOGRAM  04/2001   EF 55-60%. Mild posterior-inferior hypokinesis. GR 1 DD.    Social History:  Ambulatory   independently      reports that he quit smoking about 12 years ago. His smoking use included cigarettes. He has never used smokeless tobacco. He reports that he does not currently use alcohol. He reports that he does not use drugs.   Family History:   Family History  Problem Relation Age of Onset   Heart attack Brother    Stroke Mother    ______________________________________________________________________________________________ Allergies: Allergies  Allergen Reactions   Atorvastatin Other (See Comments)    muscle cramp   Simvastatin Other (See Comments)    muscle cramp     Prior to Admission medications   Medication Sig Start Date End Date Taking? Authorizing Provider  aspirin 81 MG chewable tablet Chew 81 mg by mouth daily.   Yes [provider]  carvedilol (COREG) 3.125 MG tablet Take 1 tablet (3.125 mg total) by mouth 2  (two) times daily. 10/14/22  Yes Yates Decamp, MD  ezetimibe (ZETIA) 10 MG tablet Take 1 tablet (10 mg total) by mouth every evening. 10/14/22  Yes Yates Decamp, MD  nitroGLYCERIN (NITROSTAT) 0.4 MG SL tablet Place 1 tablet (0.4 mg total) under the tongue every 5 (five) minutes as needed for chest pain. 08/04/18  Yes Yates Decamp, MD  olmesartan-hydrochlorothiazide (BENICAR HCT) 40-12.5 MG tablet Take 1 tablet by mouth every morning. 10/14/22  Yes Yates Decamp, MD  pantoprazole (PROTONIX) 40 MG tablet Take 1 tablet (40 mg total) by mouth daily. TAKE  ONE TABLET BY MOUTH DAILY BEFORE BREAKFAST AS NEEDED FOR HEARTBURN 04/02/23  Yes Yates Decamp, MD  REPATHA SURECLICK 140 MG/ML SOAJ INJECT 140MG  INTO THE SKIN EVERY 14 DAYS 12/10/22  Yes Yates Decamp, MD  Cholecalciferol (VITAMIN D3) 75 MCG (3000 UT) TABS Take 1 tablet by mouth daily. Patient not taking: Reported on 08/09/2023 10/14/22   Yates Decamp, MD    ___________________________________________________________________________________________________ Physical Exam:    08/09/2023    9:00 PM 08/09/2023    8:00 PM 08/09/2023    7:00 PM  Vitals with BMI  Systolic 116 126 657  Diastolic 64 73 66  Pulse 57 54 55     1. General:  in No  Acute distress    well   -appearing 2. Psychological: Alert and   Oriented 3. Head/ENT:  Dry Mucous Membranes                          Head Non traumatic, neck supple                           Poor Dentition 4. SKIN: decreased Skin turgor,  Skin clean Dry and intact no rash    5. Heart: Regular rate and rhythm no  Murmur, no Rub or gallop 6. Lungs:  Clear to auscultation bilaterally, no wheezes or crackles   7. Abdomen: Soft,  non-tender, Non distended   obese  bowel sounds present 8. Lower extremities: no clubbing, cyanosis, no  edema 9. Neurologically Grossly intact, moving all 4 extremities equally   10. MSK: Normal range of motion    Chart has been  reviewed  ______________________________________________________________________________________________  Assessment/Plan  68 y.o. male with medical history significant of CAD sp CABG, HLD, HTN,   Admitted for   AKI,  Nausea vomiting and diarrhea, Dehydration    Present on Admission:  AKI (acute kidney injury) (HCC)  Essential hypertension  Coronary artery disease involving native heart without angina pectoris  Nausea vomiting and diarrhea  Dehydration    Essential hypertension Hold ARB given AKI  AKI (acute kidney injury) (HCC) Likely due to dehydration  -  evidence of acute renal failure due to presence of following: Cr increased >0.3 from baseline   Cr increased 1.5 times over past 7 days  likely secondary to dehydration,        check FeNA       Rehydrate with IV fluids      HOLD  ACE/ARBi and nephrotoxic medications  History does not suggest urinary retention or obstruction        CTabd no renal abnormality    If persists despite fluid resuscitation will need renal consult.    Coronary artery disease involving native heart without angina pectoris Chronic stable resume Aspirin when able to tolerate Resume zetia 10 mg po q day when able to tolerate  Hold coreg due to hypotension   Nausea vomiting and diarrhea Supportive management Gastric panel ordered  Dehydration Will rehydrate and follow fluid status   Other plan as per orders.  DVT prophylaxis:  SCD    Code Status:    Code Status: Prior FULL CODE as per patient   I had personally discussed CODE STATUS with patient   ACP   none   Family Communication:   Family not at  Bedside    Diet  Diet Orders (From admission, onward)     Start     Ordered  08/09/23 2217  Diet clear liquid Room service appropriate? Yes; Fluid consistency: Thin  Diet effective now       Question Answer Comment  Room service appropriate? Yes   Fluid consistency: Thin      08/09/23 2216            Disposition Plan:      To  home once workup is complete and patient is stable   Following barriers for discharge:                                                         Electrolytes corrected                                Consult Orders  (From admission, onward)           Start     Ordered   08/09/23 2121  Consult to hospitalist  Once       Provider:  (Not yet assigned)  Question Answer Comment  Place call to: [facility]   Reason for Consult Admit      08/09/23 2120             Consults called: none   Admission status:  ED Disposition     ED Disposition  Admit   Condition  --   Comment  Hospital Area: [facility] [[id number]]  Level of Care: Telemetry [5]  Admit to tele based on following criteria: Other see comments  Comments: aki  May admit patient to [facility] or [facility] if equivalent level of care is available:: No  Covid Evaluation: Asymptomatic - no recent exposure (last 10 days) testing not required  Diagnosis: AKI (acute kidney injury) [facility]) [[id number]]  Admitting Physician: Therisa Doyne [[id number]]  Attending Physician: Therisa Doyne [[id number]]  Certification:: I certify there are rare and unusual circumstances requiring inpatient admission  Expected Medical Readiness: 08/13/2023            inpatient     I Expect 2 midnight stay secondary to severity of patient's current illness need for inpatient interventions justified by the following:  Severe lab/radiological/exam abnormalities including:    AKi and extensive comorbidities including:   CAD   That are currently affecting medical management.   I expect  patient to be hospitalized for 2 midnights requiring inpatient medical care.  Patient is at high risk for adverse outcome (such as loss of life or disability) if not treated.  Indication for inpatient stay as follows:   Hemodynamic instability despite maximal medical therapy,     Need for  IV fluids,    Level of care     tele  For    24H      Samuel Hawkins 08/09/2023, 11:16 PM    [facility]     after 2 AM please page floor coverage PA If 7AM-7PM, please contact the day team taking care of the patient using [email or web address]

## 2023-08-09 NOTE — Subjective & Objective (Signed)
 Nv D for 1 wk Unable to tolerate PO no Abd pain  10 bm a day  Muscle aches  No fever  some chills  No bleeding no melena No mArejuana No abx no travel no sick contacts

## 2023-08-09 NOTE — Assessment & Plan Note (Signed)
 With nausea, reflux and diarrhea per pt, now with worsening low volume and unable to take po today; for zofran 4 mg IM today, but also defer pt to [facility] ED for further evaluation

## 2023-08-09 NOTE — Assessment & Plan Note (Signed)
Lab Results  Component Value Date   HGBA1C 5.6 10/27/2021   Stable, pt to continue current medical treatment  - diet, wt control

## 2023-08-09 NOTE — Assessment & Plan Note (Signed)
Will rehydrate and follow fluid status °

## 2023-08-09 NOTE — Assessment & Plan Note (Signed)
 Supportive management Gastric panel ordered

## 2023-08-09 NOTE — Assessment & Plan Note (Signed)
Hold ARB given AKI

## 2023-08-09 NOTE — Assessment & Plan Note (Addendum)
 Likely due to dehydration  -  evidence of acute renal failure due to presence of following: Cr increased >0.3 from baseline   Cr increased 1.5 times over past 7 days  likely secondary to dehydration,        check FeNA       Rehydrate with IV fluids      HOLD  ACE/ARBi and nephrotoxic medications  History does not suggest urinary retention or obstruction        CTabd no renal abnormality    If persists despite fluid resuscitation will need renal consult.

## 2023-08-09 NOTE — ED Provider Notes (Signed)
 [place] EMERGENCY DEPARTMENT AT [facility] Provider Note   CSN: [id number] Arrival date & time: 08/09/23  1709     History  Chief Complaint  Patient presents with   Emesis    Samuel Hawkins is a 68 y.o. male with PMH as listed below who presents with nausea/vomiting and diarrhea for 7 days, worsening over the last two days. No abdominal pain or hematochezia/melena. No h/o similar. Denies fever but endorses chills. Unable to tolerate PO. Watery diarrhea up to 10 BMs per day. No h/o travel, recent abx use, hospitalizations/surgeries. [facility] and dx w/ gastroenteritis.   Past Medical History:  Diagnosis Date   CAD (coronary artery disease) 04/2012   Dr. Jacinto Halim: EF 55%; dominant right with diffuse 80% mid vessel disease. Just prior to bifurcation is a 90% stenosis.  D1 90% & LAD 70% after D1 - FFR positive. Large D2 noted.  Proximal circumflex 90% at OM1, ostial OM1 80%. Subtotal OM 2 with TIMI 1 flow (large vessel with several branches).  Ostial ramus 80%.   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       Home Medications Prior to Admission medications   Medication Sig Start Date End Date Taking? Authorizing Provider  aspirin 81 MG chewable tablet Chew 81 mg by mouth daily.   Yes [provider]  carvedilol (COREG) 3.125 MG tablet Take 1 tablet (3.125 mg total) by mouth 2 (two) times daily. 10/14/22  Yes Yates Decamp, MD  ezetimibe (ZETIA) 10 MG tablet Take 1 tablet (10 mg total) by mouth every evening. 10/14/22  Yes Yates Decamp, MD  nitroGLYCERIN (NITROSTAT) 0.4 MG SL tablet Place 1 tablet (0.4 mg total) under the tongue every 5 (five) minutes as  needed for chest pain. 08/04/18  Yes Yates Decamp, MD  olmesartan-hydrochlorothiazide (BENICAR HCT) 40-12.5 MG tablet Take 1 tablet by mouth every morning. 10/14/22  Yes Yates Decamp, MD  pantoprazole (PROTONIX) 40 MG tablet Take 1 tablet (40 mg total) by mouth daily. TAKE ONE TABLET BY MOUTH DAILY BEFORE BREAKFAST AS NEEDED FOR HEARTBURN 04/02/23  Yes Yates Decamp, MD  REPATHA SURECLICK 140 MG/ML SOAJ INJECT 140MG  INTO THE SKIN EVERY 14 DAYS 12/10/22  Yes Yates Decamp, MD  Cholecalciferol (VITAMIN D3) 75 MCG (3000 UT) TABS Take 1 tablet by mouth daily. Patient not taking: Reported on 08/09/2023 10/14/22   Yates Decamp, MD      Allergies    Atorvastatin and Simvastatin    Review of Systems   Review of Systems A 10 point review of systems was performed and is negative unless otherwise reported in HPI.  Physical Exam Updated Vital Signs BP 116/64   Pulse (!) 57   Temp 98.3 F (36.8 C) (Oral)   Resp 18   SpO2 98%  Physical Exam General: Normal appearing male, lying in bed.  HEENT: Sclera anicteric, dry mucous membranes, trachea midline.  Cardiology: RRR, no murmurs/rubs/gallops. Resp: Normal respiratory rate and effort. CTAB, no wheezes, rhonchi, crackles.  Abd: Soft, non-tender, non-distended. No rebound tenderness or guarding.  GU: Deferred. MSK: No peripheral edema or signs of trauma. Extremities without deformity or TTP. No cyanosis or clubbing. Skin: warm, dry.  Back:  No CVA tenderness Neuro: A&Ox4, CNs II-XII grossly intact. MAEs. Sensation grossly intact.  Psych: Normal mood and affect.   ED Results / Procedures / Treatments   Labs (all labs ordered are listed, but only abnormal results are displayed) Labs Reviewed  COMPREHENSIVE METABOLIC PANEL - Abnormal; Notable for the following components:      Result Value   CO2 20 (*)    Glucose, Bld 122 (*)    BUN 37 (*)    Creatinine, Ser 4.62 (*)    Total Protein 8.6 (*)    GFR, Estimated 13 (*)    All other components within normal  limits  CBC WITH DIFFERENTIAL/PLATELET - Abnormal; Notable for the following components:   WBC 18.0 (*)    Platelets 482 (*)    Neutro Abs 13.5 (*)    Monocytes Absolute 1.5 (*)    Abs Immature Granulocytes 0.11 (*)    All other components within normal limits  C DIFFICILE QUICK SCREEN W PCR REFLEX    GASTROINTESTINAL PANEL BY PCR, STOOL (REPLACES STOOL CULTURE)  LIPASE, BLOOD  CK  URINALYSIS, ROUTINE W REFLEX MICROSCOPIC  POC OCCULT BLOOD, ED    EKG None  Radiology CT ABDOMEN PELVIS WO CONTRAST Result Date: 08/09/2023 CLINICAL DATA:  Nausea and vomiting for 2 days, initial encounter EXAM: CT ABDOMEN AND PELVIS WITHOUT CONTRAST TECHNIQUE: Multidetector CT imaging of the abdomen and pelvis was performed following the standard protocol without IV contrast. RADIATION DOSE REDUCTION: This exam was performed according to the departmental dose-optimization program which includes automated exposure control, adjustment of the mA and/or kV according to patient size and/or use of iterative reconstruction technique. COMPARISON:  09/04/2015 FINDINGS: Lower chest: Lung bases demonstrate minimal scarring. No focal infiltrate is seen. Hepatobiliary: No focal liver abnormality is seen. No gallstones, gallbladder wall thickening, or biliary dilatation. Pancreas: Unremarkable. No pancreatic ductal dilatation or surrounding inflammatory changes. Spleen: Normal in size without focal abnormality. Adrenals/Urinary Tract: Adrenal glands are within normal limits. Kidneys are well visualized bilaterally. Simple cysts are again identified bilaterally stable from the prior exam. No follow-up is recommended. No renal calculi or obstructive changes are seen. The bladder is within normal limits. Stomach/Bowel: Scattered diverticular change of the colon is noted without evidence of diverticulitis. The appendix is within normal limits. Small bowel and stomach are unremarkable. Vascular/Lymphatic: Aortic atherosclerosis. No  enlarged abdominal or pelvic lymph nodes. Reproductive: Prostate is unremarkable. Other: No abdominal wall hernia or abnormality. No abdominopelvic ascites. Musculoskeletal: No acute or significant osseous findings. 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 No findings to correspond with the patient's given clinical history are noted. Electronically Signed   By: Alcide Clever M.D.   On: 08/09/2023 21:17    Procedures .Critical Care  Performed by: Loetta Rough, MD Authorized by: Loetta Rough, MD   Critical care provider statement:    Critical care time (minutes):  30   Critical care was necessary to treat or prevent imminent or life-threatening deterioration of the following conditions:  Shock and dehydration   Critical care was time spent personally by me on the following activities:  Development of treatment plan with patient or surrogate, evaluation of patient's response to treatment, examination of patient, ordering and review of laboratory studies, ordering and review of radiographic studies, ordering and performing treatments and interventions, pulse oximetry, re-evaluation of patient's condition, review of old charts and obtaining history from patient or surrogate   Care discussed with: admitting provider  Medications Ordered in ED Medications  lactated ringers bolus 1,000 mL (0 mLs Intravenous Stopped 08/09/23 2034)  ondansetron (ZOFRAN) injection 4 mg (4 mg Intravenous Given 08/09/23 1925)  pantoprazole (PROTONIX) injection 40 mg (40 mg Intravenous Given 08/09/23 1925)  lactated ringers bolus 1,000 mL (0 mLs Intravenous Stopped 08/09/23 2034)    ED Course/ Medical Decision Making/ A&P                          Medical Decision Making Amount and/or Complexity of Data Reviewed Labs: ordered. Decision-making details documented in ED Course. Radiology: ordered. Decision-making details documented in ED Course.  Risk Prescription drug  management. Decision regarding hospitalization.    This patient presents to the ED for concern of N/V/D, muscle cramping; this involves an extensive number of treatment options, and is a complaint that carries with it a high risk of complications and morbidity.  I considered the following differential and admission for this acute, potentially life threatening condition.   MDM:    Hypotensive on arrival to 80s/60s which improved w/ fluids. Afebrile, likely hypovolemic shock as opposed to septic or cardiogenic. He does have leukocytosis but does not meet sepsis criteria. He was given 2L IVF with improvement in BP. No severe electrolyte derangements or rhabdomyolysis w/ muscle cramping complaint, likely d/t dehydration. Found to have significant AKI of Cr 4.62, which in context of presentation likely prerenal. CT abd pelvis didn't show any urinary obstruction. For his diarrheal illness, hasn't had any travel to indicate giardia or travelers diarrhea, and he has no SBO on CT scan. Likely he has viral gastrointestinal illness but also will get GI panel and C diff given extent and length of course of diarrhea (no risk factors noted). No tachycardia/fever/weight loss to indicate hyperthyroidism. He also mentions sensation of heartburn/indigestion and will give protonix IV.   Clinical Course as of 08/09/23 2139  Mon Aug 09, 2023  1905 CK Total: 237 No rhabdo [HN]  1905 Lipase: 41 neg [HN]  1905 WBC(!): 18.0 +leukocytosis w/ left shift [HN]  1905 Creatinine(!): 4.62 +AKI up from BL ~1 [HN]  1905 BUN(!): 37 Elevated BUN [HN]  1906 BP improved to 100/60s w/ one liter of fluids. Will resuscitate with a 2nd liter as well.  [HN]  1955 Fecal Occult Blood, POC: NEGATIVE [HN]  2120 CT ABDOMEN PELVIS WO CONTRAST Diverticular change of the colon without evidence of diverticulitis.  No findings to correspond with the patient's given clinical history are noted.   [HN]    Clinical Course User Index [HN]  Loetta Rough, MD    Labs: I Ordered, and personally interpreted labs.  The pertinent results include: Those listed above  Imaging Studies ordered: I ordered imaging studies including CT ab and pelvis with contrast I independently visualized and interpreted imaging. I agree with the radiologist interpretation  Additional history obtained from chart review, girlfriend bedside.    Cardiac Monitoring: The patient was maintained on a cardiac monitor.  I personally viewed and interpreted the cardiac monitored which showed an underlying rhythm of: Normal sinus rhythm and sinus bradycardia  Reevaluation: After the interventions noted above, I reevaluated the patient and found that they have :improved  Social Determinants of Health: Lives independently  Disposition:  Admitted to medicine  Co morbidities that complicate the patient evaluation  Past Medical History:  Diagnosis Date   CAD (coronary artery disease) 04/2012   Dr. Jacinto Halim: EF 55%; dominant right with diffuse 80% mid vessel  disease. Just prior to bifurcation is a 90% stenosis.  D1 90% & LAD 70% after D1 - FFR positive. Large D2 noted.  Proximal circumflex 90% at OM1, ostial OM1 80%. Subtotal OM 2 with TIMI 1 flow (large vessel with several branches).  Ostial ramus 80%.   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     Medicines Meds ordered this encounter  Medications   lactated ringers bolus 1,000 mL   ondansetron (ZOFRAN) injection 4 mg   pantoprazole (PROTONIX) injection 40 mg   lactated ringers bolus 1,000 mL    I have reviewed the patients home medicines and have made adjustments as needed  Problem List / ED Course: Problem List  Items Addressed This Visit   None Visit Diagnoses       AKI (acute kidney injury) (HCC)    -  Primary     Nausea vomiting and diarrhea         Dehydration                       This note was created using dictation software, which may contain spelling or grammatical errors.    Loetta Rough, MD 08/09/23 [phone]

## 2023-08-09 NOTE — Progress Notes (Signed)
 Patient ID: Samuel Hawkins, male   DOB: July 01, 1955, 68 y.o.   MRN: [id number]        Chief Complaint: follow up nausea, abd pain, seen in ED mar 13       HPI:  Samuel Hawkins is a 68 y.o. male here with above, never really go  better but has been able to work and went to a company dinner at [employer], but in last 2 days symptoms of nausea, reflux, persistent diarrhea, right upper leg thigh cramping, generalized weakness and today unable to take po well even adult Electrolytes.  Has also has some minor cough today,  Pt denies chest pain, increased sob or doe, wheezing, orthopnea, PND, increased LE swelling, palpitations, dizziness or syncope.   Denies worsening abd pain, dysphagia, or blood.    Pt denies polydipsia, polyuria, or new focal neuro s/s.   Has lost wt per wife, eyes sunken, patient has resisted going to ED yet.        Wt Readings from Last 3 Encounters:  08/09/23 189 lb (85.7 kg)  07/29/23 195 lb (88.5 kg)  10/14/22 193 lb (87.5 kg)   BP Readings from Last 3 Encounters:  08/09/23 110/70  07/29/23 122/66  10/14/22 (!) 140/82         Past Medical History:  Diagnosis Date   CAD (coronary artery disease) 04/2012   Dr. Jacinto Halim: EF 55%; dominant right with diffuse 80% mid vessel disease. Just prior to bifurcation is a 90% stenosis.  D1 90% & LAD 70% after D1 - FFR positive. Large D2 noted.  Proximal circumflex 90% at OM1, ostial OM1 80%. Subtotal OM 2 with TIMI 1 flow (large vessel with several branches).  Ostial ramus 80%.   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   Past Surgical History:  Procedure Laterality Date   CARDIAC CATHETERIZATION     CORONARY ARTERY BYPASS GRAFT  05/04/2011   Procedure:  CORONARY ARTERY BYPASS GRAFTING (CABG);  Surgeon: Loreli Slot, MD;  Location: [facility] OR;  Service: Open Heart Surgery;  Laterality: N/A;  CABG x  six;  using bilateral  internal mammary arteries and left leg greater saphenous vein harvested endoscopically --LIMA-LAD, RIMA-dRCA, SeqSVG-OM3-OM4   CORONARY STENT INTERVENTION N/A 08/02/2018   Procedure: CORONARY STENT INTERVENTION;  Surgeon: Yates Decamp, MD;  Location: [facility] INVASIVE CV LAB;  Service: Cardiovascular;  Laterality: N/A;   LEFT HEART CATH AND CORS/GRAFTS ANGIOGRAPHY N/A 08/02/2018   Procedure: LEFT HEART CATH AND CORS/GRAFTS ANGIOGRAPHY;  Surgeon: Yates Decamp, MD;  Location: [facility] INVASIVE CV LAB;  Service: Cardiovascular;  Laterality: N/A;   LEFT HEART CATHETERIZATION WITH CORONARY ANGIOGRAM N/A 04/30/2011   Procedure: LEFT HEART CATHETERIZATION WITH CORONARY ANGIOGRAM;  Surgeon: Pamella Pert, MD;  Location: [facility] CATH LAB: EF 55%; dominant right with diffuse 80% mid vessel disease. Just prior to bifurcation is a 90% stenosis.  D1 90% & LAD 70% after D1 - FFR positive. Large D2 noted.  Proximal circumflex 90% at OM1, ost OM1 80%. Subtotal OM 2 w/ TIMI 1 flow (large w/ several branches), Ost RI 80%   NM MYOVIEW LTD  11/2012   Stress EKG showed 1.5 mm ST depression at peak exercise recovering less than 2 minutes into  recovery. He exercised for 11:52 min. 12.9 METS. Small-moderate-sized inferior-inferolateral transmural scar without infarction. Mild inferior wall hypokinesis. EF 61%. LOW RISK   NM MYOVIEW LTD  08/26/2016   Exercise 11:41 min 12.7 MET S -peak heart rate 162 BPM (90% of max predicted). Normal blood pressure response = excellent exercise capacity..  EF 50%. Medium size, moderate intensity perfusion defect in the basal inferior-basal lateral and inferolateral wall consistent with prior infarction. No evidence of reversibility/ischemia. (Similar to previous study)   TRANSTHORACIC ECHOCARDIOGRAM  04/2001   EF 55-60%. Mild  posterior-inferior hypokinesis. GR 1 DD.    reports that he quit smoking about 12 years ago. His smoking use included cigarettes. He has never used smokeless tobacco. He reports that he does not currently use alcohol. He reports that he does not use drugs. family history includes Heart attack in his brother; Stroke in his mother. Allergies  Allergen Reactions   Atorvastatin Other (See Comments)    muscle cramp   Simvastatin Other (See Comments)    muscle cramp   Current Outpatient Medications on File Prior to Visit  Medication Sig Dispense Refill   aspirin 81 MG chewable tablet Chew 81 mg by mouth daily.     carvedilol (COREG) 3.125 MG tablet Take 1 tablet (3.125 mg total) by mouth 2 (two) times daily. 180 tablet 3   Cholecalciferol (VITAMIN D3) 75 MCG (3000 UT) TABS Take 1 tablet by mouth daily. 90 tablet 3   ezetimibe (ZETIA) 10 MG tablet Take 1 tablet (10 mg total) by mouth every evening. 90 tablet 3   nitroGLYCERIN (NITROSTAT) 0.4 MG SL tablet Place 1 tablet (0.4 mg total) under the tongue every 5 (five) minutes as needed for chest pain. 15 tablet 1   olmesartan-hydrochlorothiazide (BENICAR HCT) 40-12.5 MG tablet Take 1 tablet by mouth every morning. 90 tablet 3   pantoprazole (PROTONIX) 40 MG tablet Take 1 tablet (40 mg total) by mouth daily. TAKE ONE TABLET BY MOUTH DAILY BEFORE BREAKFAST AS NEEDED FOR HEARTBURN 90 tablet 1   REPATHA SURECLICK 140 MG/ML SOAJ INJECT 140MG  INTO THE SKIN EVERY 14 DAYS 6 mL 3   vardenafil (LEVITRA) 20 MG tablet TAKE ONE TABLET BY MOUTH DAILY AS NEEDED FOR ERECTILE DYSFUNCTION (Patient not taking: Reported on 08/09/2023) 10 tablet 5   No current facility-administered medications on file prior to visit.        ROS:  All others reviewed and negative.  Objective        PE:  BP 110/70 (BP Location: Right Arm, Patient Position: Supine, Cuff Size: Normal)   Pulse 70   Temp 98.7 F (37.1 C) (Oral)   Ht 5\' 7"  (1.702 m)   Wt 189 lb (85.7 kg)   SpO2 98%    BMI 29.60 kg/m                 Constitutional: Pt appears mild to mod ill, prefers to lie flat on exam table               HENT: Head: NCAT.                Right Ear: External ear normal.                 Left Ear: External ear normal.                Eyes: . Pupils are equal, round, and reactive to light. Conjunctivae and EOM are normal  Nose: without d/c or deformity               Neck: Neck supple. Gross normal ROM               Cardiovascular: Normal rate and regular rhythm.                 Pulmonary/Chest: Effort normal and breath sounds without rales or wheezing.                Abd:  Soft, NT, ND, + BS, no organomegaly - benign               Neurological: Pt is alert. At baseline orientation, motor grossly intact               Skin: Skin is warm. No rashes, no other new lesions, LE edema - none               Psychiatric: Pt behavior is normal without agitation   Micro: none  Cardiac tracings I have personally interpreted today:  none  Pertinent Radiological findings (summarize): none   Lab Results  Component Value Date   WBC 8.0 10/27/2021   HGB 15.4 10/27/2021   HCT 45.2 10/27/2021   PLT 297.0 10/27/2021   GLUCOSE 106 (H) 10/27/2021   CHOL 82 (L) 11/16/2022   TRIG 127 11/16/2022   HDL 30 (L) 11/16/2022   LDLDIRECT 176.2 07/10/2010   LDLCALC 29 11/16/2022   ALT 18 10/27/2021   AST 19 10/27/2021   NA 141 10/27/2021   K 4.4 10/27/2021   CL 108 10/27/2021   CREATININE 1.07 10/27/2021   BUN 19 10/27/2021   CO2 27 10/27/2021   TSH 5.33 10/27/2021   PSA 1.51 10/27/2021   INR 1.54 (H) 05/04/2011   HGBA1C 5.6 10/27/2021   Assessment/Plan:  Samuel Hawkins is a 69 y.o. Other or two or more races [6] male with  has a past medical history of CAD (coronary artery disease) (04/2012), ERECTILE DYSFUNCTION (05/01/2008), Glucose intolerance (impaired glucose tolerance), History of erectile dysfunction, Hyperlipidemia, HYPERLIPIDEMIA (05/01/2008), Hypertension, Impaired  glucose tolerance (07/13/2011), and S/P CABG (coronary artery bypass graft) (07/13/2011).  Gastrointestinal symptoms With nausea, reflux and diarrhea per pt, now with worsening low volume and unable to take po today; for zofran 4 mg IM today, but also defer pt to [facility] ED for further evaluation  Essential hypertension BP Readings from Last 3 Encounters:  08/09/23 110/70  07/29/23 122/66  10/14/22 (!) 140/82   Low normal, pt to continue medical treatment coreg 3.125 but hold the benicar HCT   Vitamin D deficiency Last vitamin D Lab Results  Component Value Date   VD25OH 29.41 (L) 10/27/2021   Low, to start oral replacement   Impaired glucose tolerance Lab Results  Component Value Date   HGBA1C 5.6 10/27/2021   Stable, pt to continue current medical treatment  - diet,wt control   Leg cramp Likely should add Magnesium to labs in ED please  Followup: Return if symptoms worsen or fail to improve.  Oliver Barre, MD 08/09/2023 4:52 PM [facility] Internal Medicine

## 2023-08-09 NOTE — Assessment & Plan Note (Signed)
Last vitamin D Lab Results  Component Value Date   VD25OH 29.41 (L) 10/27/2021   Low, to start oral replacement

## 2023-08-09 NOTE — Assessment & Plan Note (Signed)
 Chronic stable resume Aspirin when able to tolerate Resume zetia 10 mg po q day when able to tolerate  Hold coreg due to hypotension

## 2023-08-10 ENCOUNTER — Encounter (HOSPITAL_COMMUNITY): Payer: Self-pay | Admitting: Internal Medicine

## 2023-08-10 DIAGNOSIS — N179 Acute kidney failure, unspecified: Secondary | ICD-10-CM | POA: Diagnosis not present

## 2023-08-10 LAB — BASIC METABOLIC PANEL
Anion gap: 7 (ref 5–15)
Anion gap: 3 — ABNORMAL LOW (ref 5–15)
Anion gap: 6 (ref 5–15)
BUN: 37 mg/dL — ABNORMAL HIGH (ref 8–23)
BUN: 36 mg/dL — ABNORMAL HIGH (ref 8–23)
BUN: 33 mg/dL — ABNORMAL HIGH (ref 8–23)
CO2: 19 mmol/L — ABNORMAL LOW (ref 22–32)
CO2: 23 mmol/L (ref 22–32)
CO2: 21 mmol/L — ABNORMAL LOW (ref 22–32)
Calcium: 8.2 mg/dL — ABNORMAL LOW (ref 8.9–10.3)
Calcium: 8.1 mg/dL — ABNORMAL LOW (ref 8.9–10.3)
Calcium: 7.9 mg/dL — ABNORMAL LOW (ref 8.9–10.3)
Chloride: 108 mmol/L (ref 98–111)
Chloride: 107 mmol/L (ref 98–111)
Chloride: 109 mmol/L (ref 98–111)
Creatinine, Ser: 2.74 mg/dL — ABNORMAL HIGH (ref 0.61–1.24)
Creatinine, Ser: 2.53 mg/dL — ABNORMAL HIGH (ref 0.61–1.24)
Creatinine, Ser: 2.1 mg/dL — ABNORMAL HIGH (ref 0.61–1.24)
GFR, Estimated: 25 mL/min — ABNORMAL LOW (ref >60)
GFR, Estimated: 27 mL/min — ABNORMAL LOW (ref >60)
GFR, Estimated: 34 mL/min — ABNORMAL LOW (ref >60)
Glucose, Bld: 100 mg/dL — ABNORMAL HIGH (ref 70–99)
Glucose, Bld: 119 mg/dL — ABNORMAL HIGH (ref 70–99)
Glucose, Bld: 109 mg/dL — ABNORMAL HIGH (ref 70–99)
Potassium: 3.9 mmol/L (ref 3.5–5.1)
Potassium: 3.9 mmol/L (ref 3.5–5.1)
Potassium: 4.5 mmol/L (ref 3.5–5.1)
Sodium: 134 mmol/L — ABNORMAL LOW (ref 135–145)
Sodium: 133 mmol/L — ABNORMAL LOW (ref 135–145)
Sodium: 136 mmol/L (ref 135–145)

## 2023-08-10 LAB — COMPREHENSIVE METABOLIC PANEL
ALT: 19 U/L (ref 0–44)
AST: 18 U/L (ref 15–41)
Albumin: 3.3 g/dL — ABNORMAL LOW (ref 3.5–5.0)
Alkaline Phosphatase: 44 U/L (ref 38–126)
Anion gap: 3 — ABNORMAL LOW (ref 5–15)
BUN: 36 mg/dL — ABNORMAL HIGH (ref 8–23)
CO2: 22 mmol/L (ref 22–32)
Calcium: 8.3 mg/dL — ABNORMAL LOW (ref 8.9–10.3)
Chloride: 108 mmol/L (ref 98–111)
Creatinine, Ser: 2.75 mg/dL — ABNORMAL HIGH (ref 0.61–1.24)
GFR, Estimated: 25 mL/min — ABNORMAL LOW (ref >60)
Glucose, Bld: 96 mg/dL (ref 70–99)
Potassium: 3.8 mmol/L (ref 3.5–5.1)
Sodium: 133 mmol/L — ABNORMAL LOW (ref 135–145)
Total Bilirubin: 0.8 mg/dL (ref 0.0–1.2)
Total Protein: 6.7 g/dL (ref 6.5–8.1)

## 2023-08-10 LAB — OSMOLALITY, URINE: Osmolality, Ur: 449 mosm/kg (ref 300–900)

## 2023-08-10 LAB — URINALYSIS, COMPLETE (UACMP) WITH MICROSCOPIC
Bilirubin Urine: NEGATIVE
Glucose, UA: NEGATIVE mg/dL
Ketones, ur: NEGATIVE mg/dL
Leukocytes,Ua: NEGATIVE
Nitrite: NEGATIVE
Protein, ur: 30 mg/dL — AB
Specific Gravity, Urine: 1.013 (ref 1.005–1.030)
pH: 5 (ref 5.0–8.0)

## 2023-08-10 LAB — CBC
HCT: 42.9 % (ref 39.0–52.0)
Hemoglobin: 14.2 g/dL (ref 13.0–17.0)
MCH: 30.5 pg (ref 26.0–34.0)
MCHC: 33.1 g/dL (ref 30.0–36.0)
MCV: 92.1 fL (ref 80.0–100.0)
Platelets: 356 10*3/uL (ref 150–400)
RBC: 4.66 MIL/uL (ref 4.22–5.81)
RDW: 13.6 % (ref 11.5–15.5)
WBC: 12.3 10*3/uL — ABNORMAL HIGH (ref 4.0–10.5)
nRBC: 0 % (ref 0.0–0.2)

## 2023-08-10 LAB — PHOSPHORUS: Phosphorus: 4.2 mg/dL (ref 2.5–4.6)

## 2023-08-10 LAB — HIV ANTIBODY (ROUTINE TESTING W REFLEX): HIV Screen 4th Generation wRfx: NONREACTIVE

## 2023-08-10 LAB — SODIUM, URINE, RANDOM: Sodium, Ur: 72 mmol/L

## 2023-08-10 LAB — CREATININE, URINE, RANDOM: Creatinine, Urine: 233 mg/dL

## 2023-08-10 LAB — MAGNESIUM: Magnesium: 2.1 mg/dL (ref 1.7–2.4)

## 2023-08-10 LAB — PREALBUMIN: Prealbumin: 24 mg/dL (ref 18–38)

## 2023-08-10 LAB — LACTIC ACID, PLASMA: Lactic Acid, Venous: 1.2 mmol/L (ref 0.5–1.9)

## 2023-08-10 MED ORDER — ONDANSETRON HCL 4 MG PO TABS
4.0000 mg | ORAL_TABLET | Freq: Four times a day (QID) | ORAL | Status: DC | PRN
  Administered 2023-08-11: 4 mg via ORAL
  Filled 2023-08-10: qty 1

## 2023-08-10 MED ORDER — ONDANSETRON HCL 4 MG PO TABS: 4.0000 mg | ORAL_TABLET | Freq: Four times a day (QID) | ORAL | 0 refills | Status: DC | PRN

## 2023-08-10 MED ORDER — ACETAMINOPHEN 650 MG RE SUPP
650.0000 mg | Freq: Four times a day (QID) | RECTAL | Status: DC | PRN
Start: 2023-08-10 — End: 2023-08-11

## 2023-08-10 MED ORDER — SODIUM CHLORIDE 0.9 % IV SOLN: INTRAVENOUS | Status: DC

## 2023-08-10 MED ORDER — FENTANYL CITRATE PF 50 MCG/ML IJ SOSY: 12.5000 ug | PREFILLED_SYRINGE | INTRAMUSCULAR | Status: DC | PRN

## 2023-08-10 MED ORDER — ONDANSETRON HCL 4 MG/2ML IJ SOLN: 4.0000 mg | Freq: Four times a day (QID) | INTRAMUSCULAR | Status: DC | PRN

## 2023-08-10 MED ORDER — HYDROCODONE-ACETAMINOPHEN 5-325 MG PO TABS
1.0000 | ORAL_TABLET | ORAL | Status: DC | PRN
Start: 2023-08-10 — End: 2023-08-11

## 2023-08-10 MED ORDER — TRAZODONE HCL 50 MG PO TABS
50.0000 mg | ORAL_TABLET | Freq: Every day | ORAL | Status: DC
  Administered 2023-08-10: 50 mg via ORAL
  Filled 2023-08-10: qty 1

## 2023-08-10 MED ORDER — ACETAMINOPHEN 325 MG PO TABS: 650.0000 mg | ORAL_TABLET | Freq: Four times a day (QID) | ORAL | Status: DC | PRN

## 2023-08-10 NOTE — Progress Notes (Signed)
 PROGRESS NOTE    Samuel Hawkins  EAV:[id number] DOB: May 24, 1955 DOA: 08/09/2023 PCP: Corwin Levins, MD  Brief Narrative: 68 year old male admitted with nausea vomiting diarrhea for 1 week.  Since coming to the hospital he has no diarrhea or vomiting reported.  He has a history of CABG hyperlipidemia and hypertension and diastolic systolic combined heart failure.  His ejection fraction 60 to 65% from July 20, 2023 echo.  Denies marijuana use.  His blood pressure was soft on arrival and 100s to 60s he was given IV fluids in the ER.  Creatinine was 4.6 on admission up from baseline of 1, white count was 18 CPK 237. Assessment & Plan:   Principal Problem:   AKI (acute kidney injury) (HCC) Active Problems:   Essential hypertension   Coronary artery disease involving native heart without angina pectoris   Nausea vomiting and diarrhea   Dehydration  #1 AKI from dehydration nausea vomiting diarrhea and poor p.o. intake in the setting of ACE ARB continue IV hydration creatinine improving but not yet back to his baseline.  Continue supportive measures with Tylenol Zofran etc. Creatinine 2.53 from 4.62 on admission  #2 CAD/CABG on aspirin Zetia, holding Coreg due to soft BP.  # 3.  Gastroenteritis resolved GI panel negative  #4 insomnia patient is requesting something to sleep I have ordered for trazodone.   Estimated body mass index is 29.6 kg/m as calculated from the following:   Height as of an earlier encounter on 08/09/23: 5\' 7"  (1.702 m).   Weight as of an earlier encounter on 08/09/23: 85.7 kg.  DVT prophylaxis: lovenox Code Status: full Family Communication: wife at bed side  Disposition Plan:  Status is: Inpatient Remains inpatient appropriate because: acute illness   Consultants:  none  Procedures: none Antimicrobials: none  Subjective: Patient was very anxious and wanting to go home earlier when I rounded.  He is willing to stay another night to get fluid resuscitation and go  home.  Labs from today noted.  Renal functions improving   Objective: Vitals:   08/10/23 0822 08/10/23 1007 08/10/23 1259 08/10/23 1317  BP:  123/69  123/69  Pulse:  (!) 53  61  Resp:  18  17  Temp: 97.9 F (36.6 C)  (!) 97.4 F (36.3 C)   TempSrc: Oral  Oral   SpO2:  99%  97%    Intake/Output Summary (Last 24 hours) at 08/10/2023 1449 Last data filed at 08/10/2023 1015 Gross per 24 hour  Intake 3000 ml  Output 400 ml  Net 2600 ml   There were no vitals filed for this visit.  Examination:  General exam: Appears in nad Respiratory system: Clear to auscultation. Respiratory effort normal. Cardiovascular system: S1 & S2 heard, RRR. No JVD, murmurs, rubs, gallops or clicks. No pedal edema. Gastrointestinal system: Abdomen is nondistended, soft and nontender. No organomegaly or masses felt. Normal bowel sounds heard. Central nervous system: Alert and oriented. No focal neurological deficits. Extremities: no edema  Data Reviewed: I have personally reviewed following labs and imaging studies  CBC: Recent Labs  Lab 08/09/23 1731 08/10/23 0845  WBC 18.0* 12.3*  NEUTROABS 13.5*  --   HGB 16.8 14.2  HCT 50.0 42.9  MCV 91.1 92.1  PLT 482* 356   Basic Metabolic Panel: Recent Labs  Lab 08/09/23 1731 08/09/23 2143 08/10/23 0419 08/10/23 0845  NA 136  --  133*  134* 133*  K 4.8  --  3.8  3.9 3.9  CL 105  --  108  108 107  CO2 20*  --  22  19* 23  GLUCOSE 122*  --  96  100* 119*  BUN 37*  --  36*  37* 36*  CREATININE 4.62*  --  2.75*  2.74* 2.53*  CALCIUM 9.6  --  8.3*  8.2* 8.1*  MG  --  2.1 2.1  --   PHOS  --  4.2 4.2  --    GFR: Estimated Creatinine Clearance: 30.4 mL/min (A) (by C-G formula based on SCr of 2.53 mg/dL (H)). Liver Function Tests: Recent Labs  Lab 08/09/23 1731 08/09/23 2143 08/10/23 0419  AST 20 16 18   ALT 23 17 19   ALKPHOS 60 46 44  BILITOT 1.0 0.6 0.8  PROT 8.6* 6.7 6.7  ALBUMIN 4.2 3.2* 3.3*   Recent Labs  Lab 08/09/23 1731   LIPASE 41   No results for input(s): "AMMONIA" in the last 168 hours. Coagulation Profile: Recent Labs  Lab 08/09/23 2143  INR 1.1   Cardiac Enzymes: Recent Labs  Lab 08/09/23 1731  CKTOTAL 237   BNP (last 3 results) No results for input(s): "PROBNP" in the last 8760 hours. HbA1C: No results for input(s): "HGBA1C" in the last 72 hours. CBG: No results for input(s): "GLUCAP" in the last 168 hours. Lipid Profile: No results for input(s): "CHOL", "HDL", "LDLCALC", "TRIG", "CHOLHDL", "LDLDIRECT" in the last 72 hours. Thyroid Function Tests: Recent Labs    08/09/23 2143  TSH 1.188   Anemia Panel: No results for input(s): "VITAMINB12", "FOLATE", "FERRITIN", "TIBC", "IRON", "RETICCTPCT" in the last 72 hours. Sepsis Labs: Recent Labs  Lab 08/09/23 2142 08/09/23 2143  PROCALCITON  --  0.12  LATICACIDVEN 1.1  --     No results found for this or any previous visit (from the past 240 hours).       Radiology Studies: CT ABDOMEN PELVIS WO CONTRAST Result Date: 08/09/2023 CLINICAL DATA:  Nausea and vomiting for 2 days, initial encounter EXAM: CT ABDOMEN AND PELVIS WITHOUT CONTRAST TECHNIQUE: Multidetector CT imaging of the abdomen and pelvis was performed following the standard protocol without IV contrast. RADIATION DOSE REDUCTION: This exam was performed according to the departmental dose-optimization program which includes automated exposure control, adjustment of the mA and/or kV according to patient size and/or use of iterative reconstruction technique. COMPARISON:  09/04/2015 FINDINGS: Lower chest: Lung bases demonstrate minimal scarring. No focal infiltrate is seen. Hepatobiliary: No focal liver abnormality is seen. No gallstones, gallbladder wall thickening, or biliary dilatation. Pancreas: Unremarkable. No pancreatic ductal dilatation or surrounding inflammatory changes. Spleen: Normal in size without focal abnormality. Adrenals/Urinary Tract: Adrenal glands are within  normal limits. Kidneys are well visualized bilaterally. Simple cysts are again identified bilaterally stable from the prior exam. No follow-up is recommended. No renal calculi or obstructive changes are seen. The bladder is within normal limits. Stomach/Bowel: Scattered diverticular change of the colon is noted without evidence of diverticulitis. The appendix is within normal limits. Small bowel and stomach are unremarkable. Vascular/Lymphatic: Aortic atherosclerosis. No enlarged abdominal or pelvic lymph nodes. Reproductive: Prostate is unremarkable. Other: No abdominal wall hernia or abnormality. No abdominopelvic ascites. Musculoskeletal: No acute or significant osseous findings. 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 No findings to correspond with the patient's given clinical history are noted. Electronically Signed   By: Alcide Clever M.D.   On: 08/09/2023 21:17        Scheduled Meds:  traZODone  50 mg Oral QHS   Continuous Infusions:  sodium chloride       LOS: 1 day    Time spent: 38 min  Alwyn Ren, MD 08/10/2023, 2:49 PM

## 2023-08-10 NOTE — ED Notes (Signed)
 Pt wanting to leave. Provider aware, plan to bolus fluids and feed patient prior to discharge. Pt states he feels great. This RN educated on the importance of hydration following discharge. Pt verbalized understanding.

## 2023-08-10 NOTE — ED Notes (Signed)
 Pt tolerated meal with no nausea.

## 2023-08-10 NOTE — ED Notes (Signed)
 Pt and wife concerned due to being told pt had a room last night when wife left. This morning she returned to find him in the ED still. Writer called bed placement to see if answers could be obtained as to why pt had a bed then did not? There was never a bed assigned to the pt and still no rooms available. She also requested sleeping aid for him due to his schedule of working at night, She was told he would receive something and nothing was ordered. Pt presently waiting for room upstairs. His IV infiltrated and had to be restarted. Due to a lot of questions and concerns Dr Ashley Royalty was notified by chat and returned call to help answer questions. Placed on speaker phone and conversation update continued with her discussing plan of care with pt and his wife. They both agreed she had answered all the questions/concerns they have.Due to concerns wife will be given information for pt experience.

## 2023-08-11 DIAGNOSIS — N179 Acute kidney failure, unspecified: Secondary | ICD-10-CM | POA: Diagnosis not present

## 2023-08-11 LAB — BASIC METABOLIC PANEL
Anion gap: 6 (ref 5–15)
Anion gap: 8 (ref 5–15)
BUN: 26 mg/dL — ABNORMAL HIGH (ref 8–23)
BUN: 27 mg/dL — ABNORMAL HIGH (ref 8–23)
CO2: 18 mmol/L — ABNORMAL LOW (ref 22–32)
CO2: 18 mmol/L — ABNORMAL LOW (ref 22–32)
Calcium: 8.1 mg/dL — ABNORMAL LOW (ref 8.9–10.3)
Calcium: 8.1 mg/dL — ABNORMAL LOW (ref 8.9–10.3)
Chloride: 113 mmol/L — ABNORMAL HIGH (ref 98–111)
Chloride: 113 mmol/L — ABNORMAL HIGH (ref 98–111)
Creatinine, Ser: 1.41 mg/dL — ABNORMAL HIGH (ref 0.61–1.24)
Creatinine, Ser: 1.83 mg/dL — ABNORMAL HIGH (ref 0.61–1.24)
GFR, Estimated: 40 mL/min — ABNORMAL LOW (ref >60)
GFR, Estimated: 55 mL/min — ABNORMAL LOW (ref >60)
Glucose, Bld: 90 mg/dL (ref 70–99)
Glucose, Bld: 92 mg/dL (ref 70–99)
Potassium: 4.3 mmol/L (ref 3.5–5.1)
Potassium: 4.3 mmol/L (ref 3.5–5.1)
Sodium: 137 mmol/L (ref 135–145)
Sodium: 139 mmol/L (ref 135–145)

## 2023-08-11 NOTE — Discharge Summary (Signed)
 Physician Discharge Summary  Samuel Hawkins ZOX:[id number] DOB: 07/03/1955 DOA: 08/09/2023  PCP: Corwin Levins, MD  Admit date: 08/09/2023 Discharge date: 08/11/2023  Admitted From: Home Disposition: Home  Recommendations for Outpatient Follow-up:  Follow up with PCP in 1-2 weeks Discontinued home carvedilol and olmesartan/HCTZ until follows up with PCP given acute renal failure, dehydration, and borderline bradycardia/hypotension during hospitalization. Please obtain BMP in one week to reassess renal function  Home Health: No Equipment/Devices: None  Discharge Condition: Stable CODE STATUS: Full code Diet recommendation: Heart healthy diet  History of present illness:  Samuel Hawkins with past medical history significant for essential hypertension, CAD s/p CABG, hypercholesterolemia who presented to [facility] ED on 08/09/2023 with nausea, vomiting and diarrhea over the last 2 days.  Also endorses muscle cramps.  Onset of symptoms after recent job function at [employer] in which she was eating food provided.  Denies any recent sick contacts, no dietary changes otherwise.  In the ED, temperature 98.7 F, HR 70, RR 16 BP 87/64, SpO2 97% on room air.  WBC 18.0, hemoglobin 16.8, platelet count 42.  Sodium 136, potassium 4.8, chloride 105, CO2 20, glucose 122, BUN 37, Cram 4.62.  Lipase 41, AST 20, ALT 23, total bilirubin 1.0.  CK2 37.  Lactic acid 1.1.  Procalcitonin 0.12.  Urinalysis unrevealing.  FOBT negative.  CT abdomen/pelvis without contrast with diverticular change of the colon without evidence of diverticulitis, no other acute findings to correspond with patient's symptoms.  Patient was given 2L LR bolus, IV Zofran and Protonix.  TRH consulted for admission for further evaluation management of acute renal failure, gastroenteritis.  Hospital course:  Acute renal failure Viral gastroenteritis Patient presenting to ED with intractable nausea, vomiting and diarrhea.  Patient reports  he was at a job function/party at [employer] in which symptoms started thereafter.  No recent changes of his home medications, no other sick contacts, denies any other dietary changes.  Patient was afebrile with elevated WC count likely secondary to hemoconcentration in setting of severe dehydration.  Creatinine elevated 4.62 with last creatinine noted to be 1.07 in June 2023.  Lactic acid 1.1, procalcitonin low at 0.12, unlikely bacterial infectious etiology.  CT abdomen/pelvis with no acute findings.  Patient was given IV fluid bolus given he was hypotensive on admission with adequate response.  Patient was continued on IV fluid hydration, antiemetics with slow improvement of symptoms.  Patient's diet was slowly advanced with toleration.  Creatinine improved to 1.83 at time of discharge.  Will continue to hold his home Antepar tensive so include carvedilol, olmesartan-HCTZ until follows up with his cardiologist and PCP.  Essential hypertension Patient on olmesartan-HCTZ 40-12.5 mg p.o. daily, carvedilol 3.125 mg p.o. twice daily outpatient.  Given patient's viral gastroenteritis with acute renal failure with borderline hypotension and bradycardia, his home antihypertensives were discontinued at this point.  Recommend follow-up with PCP/cardiology in 1 to 2 weeks with close follow-up of his blood pressure and repeat BMP to assess his renal function.  If stabilizes may be able to restart some or both of his antihypertensives.  CAD s/p CABG Hypercholesterolemia Follows with cardiology outpatient, Dr. Jacinto Halim. Intolerant to statins, on Repatha and Zetia.   Discharge Diagnoses:  Principal Problem:   AKI (acute kidney injury) (HCC) Active Problems:   Essential hypertension   Coronary artery disease involving native heart without angina pectoris   Nausea vomiting and diarrhea   Dehydration    Discharge Instructions  Discharge Instructions  Call MD for:  difficulty breathing, headache or visual  disturbances   Complete by: As directed    Call MD for:  extreme fatigue   Complete by: As directed    Call MD for:  persistant dizziness or light-headedness   Complete by: As directed    Call MD for:  persistant nausea and vomiting   Complete by: As directed    Call MD for:  severe uncontrolled pain   Complete by: As directed    Call MD for:  temperature >100.4   Complete by: As directed    Diet - low sodium heart healthy   Complete by: As directed    Increase activity slowly   Complete by: As directed       Allergies as of 08/11/2023       Reactions   Atorvastatin Other (See Comments)   muscle cramp   Simvastatin Other (See Comments)   muscle cramp        Medication List     STOP taking these medications    carvedilol 3.125 MG tablet Commonly known as: COREG   olmesartan-hydrochlorothiazide 40-12.5 MG tablet Commonly known as: Benicar HCT   Vitamin D3 75 MCG (3000 UT) Tabs       TAKE these medications    aspirin 81 MG chewable tablet Chew 81 mg by mouth daily.   ezetimibe 10 MG tablet Commonly known as: ZETIA Take 1 tablet (10 mg total) by mouth every evening.   nitroGLYCERIN 0.4 MG SL tablet Commonly known as: NITROSTAT Place 1 tablet (0.4 mg total) under the tongue every 5 (five) minutes as needed for chest pain.   ondansetron 4 MG tablet Commonly known as: ZOFRAN Take 1 tablet (4 mg total) by mouth every 6 (six) hours as needed for nausea.   pantoprazole 40 MG tablet Commonly known as: PROTONIX Take 1 tablet (40 mg total) by mouth daily. TAKE ONE TABLET BY MOUTH DAILY BEFORE BREAKFAST AS NEEDED FOR HEARTBURN   Repatha SureClick 140 MG/ML Soaj Generic drug: Evolocumab INJECT 140MG  INTO THE SKIN EVERY 14 DAYS        Follow-up Information     Corwin Levins, MD Follow up.   Specialties: Internal Medicine, Radiology Why: For repeat labs, BMP Contact information: [address] [place] [address] [phone]                 Allergies  Allergen Reactions   Atorvastatin Other (See Comments)    muscle cramp   Simvastatin Other (See Comments)    muscle cramp    Consultations: None   Procedures/Studies: CT ABDOMEN PELVIS WO CONTRAST Result Date: 08/09/2023 CLINICAL DATA:  Nausea and vomiting for 2 days, initial encounter EXAM: CT ABDOMEN AND PELVIS WITHOUT CONTRAST TECHNIQUE: Multidetector CT imaging of the abdomen and pelvis was performed following the standard protocol without IV contrast. RADIATION DOSE REDUCTION: This exam was performed according to the departmental dose-optimization program which includes automated exposure control, adjustment of the mA and/or kV according to patient size and/or use of iterative reconstruction technique. COMPARISON:  09/04/2015 FINDINGS: Lower chest: Lung bases demonstrate minimal scarring. No focal infiltrate is seen. Hepatobiliary: No focal liver abnormality is seen. No gallstones, gallbladder wall thickening, or biliary dilatation. Pancreas: Unremarkable. No pancreatic ductal dilatation or surrounding inflammatory changes. Spleen: Normal in size without focal abnormality. Adrenals/Urinary Tract: Adrenal glands are within normal limits. Kidneys are well visualized bilaterally. Simple cysts are again identified bilaterally stable from the prior exam. No follow-up is recommended.  No renal calculi or obstructive changes are seen. The bladder is within normal limits. Stomach/Bowel: Scattered diverticular change of the colon is noted without evidence of diverticulitis. The appendix is within normal limits. Small bowel and stomach are unremarkable. Vascular/Lymphatic: Aortic atherosclerosis. No enlarged abdominal or pelvic lymph nodes. Reproductive: Prostate is unremarkable. Other: No abdominal wall hernia or abnormality. No abdominopelvic ascites. Musculoskeletal: No acute or significant osseous findings. 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 No  findings to correspond with the patient's given clinical history are noted. Electronically Signed   By: Alcide Clever M.D.   On: 08/09/2023 21:17   EXERCISE TOLERANCE TEST (ETT) Result Date: 07/20/2023   No ST deviation was noted. ETT with good exercise tolerance (10:30); no CP; normal BP response; no diagnostic ST changes; negative adequate ETT.   ECHOCARDIOGRAM COMPLETE Result Date: 07/20/2023    ECHOCARDIOGRAM REPORT   Patient Name:   Samuel Hawkins   Date of Exam: 07/20/2023 Medical Rec #:  [id number]     Height:       67.0 in Accession #:    [id number]    Weight:       193.0 lb Date of Birth:  1955/06/14     BSA:          1.992 m Patient Age:    67 years      BP:           140/82 mmHg Patient Gender: M             HR:           56 bpm. Exam Location:  [address] Procedure: 2D Echo, 3D Echo, Cardiac Doppler and Color Doppler (Both Spectral            and Color Flow Doppler were utilized during procedure). Indications:    I25.10 CAD  History:        Patient has prior history of Echocardiogram examinations, most                 recent 07/19/2018. CAD and Previous Myocardial Infarction, Prior                 CABG, Signs/Symptoms:Chest Pain; Risk Factors:Hypertension,                 Dyslipidemia, Former Smoker and Family History of Coronary                 Artery Disease. Prior EF- 50%.  Sonographer:    Farrel Conners RDCS Referring Phys: Yates Decamp IMPRESSIONS  1. Left ventricular ejection fraction, by estimation, is 60 to 65%. The left ventricle has normal function. The left ventricle has no regional wall motion abnormalities. Left ventricular diastolic parameters are indeterminate.  2. Right ventricular systolic function is normal. The right ventricular size is normal.  3. The mitral valve is normal in structure. Mild mitral valve regurgitation. No evidence of mitral stenosis.  4. The aortic valve is tricuspid. Aortic valve regurgitation is mild. Aortic valve sclerosis is present, with no evidence of aortic valve  stenosis.  5. The inferior vena cava is normal in size with greater than 50% respiratory variability, suggesting right atrial pressure of 3 mmHg. Comparison(s): No prior Echocardiogram. FINDINGS  Left Ventricle: Left ventricular ejection fraction, by estimation, is 60 to 65%. The left ventricle has normal function. The left ventricle has no regional wall motion abnormalities. The left ventricular internal cavity size was normal in size. There is  no left  ventricular hypertrophy. Left ventricular diastolic parameters are indeterminate. Right Ventricle: The right ventricular size is normal. Right ventricular systolic function is normal. Left Atrium: Left atrial size was normal in size. Right Atrium: Right atrial size was normal in size. Pericardium: There is no evidence of pericardial effusion. Mitral Valve: The mitral valve is normal in structure. Mild mitral annular calcification. Mild mitral valve regurgitation. No evidence of mitral valve stenosis. Tricuspid Valve: The tricuspid valve is normal in structure. Tricuspid valve regurgitation is mild . No evidence of tricuspid stenosis. Aortic Valve: The aortic valve is tricuspid. Aortic valve regurgitation is mild. Aortic regurgitation PHT measures 644 msec. Aortic valve sclerosis is present, with no evidence of aortic valve stenosis. Pulmonic Valve: The pulmonic valve was normal in structure. Pulmonic valve regurgitation is not visualized. No evidence of pulmonic stenosis. Aorta: The aortic root is normal in size and structure. Venous: The inferior vena cava is normal in size with greater than 50% respiratory variability, suggesting right atrial pressure of 3 mmHg. IAS/Shunts: No atrial level shunt detected by color flow Doppler. Additional Comments: 3D was performed not requiring image post processing on an independent workstation and was normal.  LEFT VENTRICLE PLAX 2D LVIDd:         4.60 cm   Diastology LVIDs:         3.00 cm   LV e' medial:    7.29 cm/s LV PW:          0.80 cm   LV E/e' medial:  15.5 LV IVS:        1.00 cm   LV e' lateral:   10.00 cm/s LVOT diam:     2.40 cm   LV E/e' lateral: 11.3 LV SV:         100 LV SV Index:   50 LVOT Area:     4.52 cm                           3D Volume EF:                          3D EF:        61 %                          LV EDV:       81 ml                          LV ESV:       32 ml                          LV SV:        49 ml RIGHT VENTRICLE RV Basal diam:  3.70 cm RV S prime:     10.40 cm/s TAPSE (M-mode): 1.5 cm RVSP:           28.4 mmHg LEFT ATRIUM             Index        RIGHT ATRIUM           Index LA diam:        5.40 cm 2.71 cm/m   RA Pressure: 3.00 mmHg LA Vol (A2C):   39.1 ml 19.63 ml/m  RA Area:     14.80 cm LA Vol (A4C):  43.0 ml 21.59 ml/m  RA Volume:   38.20 ml  19.18 ml/m LA Biplane Vol: 41.3 ml 20.73 ml/m  AORTIC VALVE LVOT Vmax:   87.00 cm/s LVOT Vmean:  57.650 cm/s LVOT VTI:    0.222 m AI PHT:      644 msec  AORTA Ao Root diam: 3.30 cm Ao Asc diam:  3.40 cm MITRAL VALVE                TRICUSPID VALVE MV Area (PHT)  cm          TR Peak grad:   25.4 mmHg MV Decel Time: 183 msec     TR Vmax:        252.00 cm/s MV E velocity: 113.00 cm/s  Estimated RAP:  3.00 mmHg MV A velocity: 103.00 cm/s  RVSP:           28.4 mmHg MV E/A ratio:  1.10                             SHUNTS                             Systemic VTI:  0.22 m                             Systemic Diam: 2.40 cm Olga Millers MD Electronically signed by Olga Millers MD Signature Date/Time: 07/20/2023/1:07:06 PM    Final      Subjective: Patient seen examined bedside, lying in bed.  Eating breakfast.  No complaints this morning.  No further nausea or vomiting.  Diarrhea much improved.  Creatinine improved to 1.83.  Ready for discharge home.  Discussed with patient to hold his home carvedilol and olmesartan/HCTZ given his relative hypotension, bradycardia and acute renal failure that is slowly improving.  Instructed him to make an appoint with  his PCP/cardiologist within 1-2 weeks.  No other specific complaints, concerns or questions at this time.  Denies headache, no dizziness, no vision changes, no chest pain, no palpitations, no shortness of breath, no abdominal pain, no fever/chills/night sweats, no nausea/vomiting/diarrhea, no focal weakness, no fatigue, no paresthesia.  No acute events overnight per nursing staff.  Discharge Exam: Vitals:   08/11/23 0208 08/11/23 0648  BP: 127/75 128/68  Pulse: 70 62  Resp: 17 18  Temp: 98.7 F (37.1 C) 98.4 F (36.9 C)  SpO2: 98% 98%   Vitals:   08/10/23 1843 08/10/23 2320 08/11/23 0208 08/11/23 0648  BP: (!) 143/56 (!) 143/68 127/75 128/68  Pulse: (!) 51 (!) 58 70 62  Resp: 17 18 17 18   Temp: 98.7 F (37.1 C) 98.5 F (36.9 C) 98.7 F (37.1 C) 98.4 F (36.9 C)  TempSrc: Oral   Oral  SpO2: 100% 97% 98% 98%    Physical Exam: GEN: NAD, alert and oriented x 3, wd/wn HEENT: NCAT, PERRL, EOMI, sclera clear, MMM PULM: CTAB w/o wheezes/crackles, normal respiratory effort, on room air CV: RRR w/o M/G/R GI: abd soft, NTND, NABS, no R/G/M MSK: no peripheral edema, muscle strength globally intact 5/5 bilateral upper/lower extremities NEURO: CN II-XII intact, no focal deficits, sensation to light touch intact PSYCH: normal mood/affect Integumentary: No concerning rashes/lesions/wounds noted on exposed skin surfaces    The results of significant diagnostics from this hospitalization (including imaging, microbiology, ancillary and laboratory) are listed below for reference.  Microbiology: No results found for this or any previous visit (from the past 240 hours).   Labs: BNP (last 3 results) No results for input(s): "BNP" in the last 8760 hours. Basic Metabolic Panel: Recent Labs  Lab 08/09/23 2143 08/10/23 0419 08/10/23 0845 08/10/23 1513 08/11/23 0011 08/11/23 0635  NA  --  133*  134* 133* 136 139 137  K  --  3.8  3.9 3.9 4.5 4.3 4.3  CL  --  108  108 107 109 113*  113*  CO2  --  22  19* 23 21* 18* 18*  GLUCOSE  --  96  100* 119* 109* 90 92  BUN  --  36*  37* 36* 33* 27* 26*  CREATININE  --  2.75*  2.74* 2.53* 2.10* 1.83* 1.41*  CALCIUM  --  8.3*  8.2* 8.1* 7.9* 8.1* 8.1*  MG 2.1 2.1  --   --   --   --   PHOS 4.2 4.2  --   --   --   --    Liver Function Tests: Recent Labs  Lab 08/09/23 1731 08/09/23 2143 08/10/23 0419  AST 20 16 18   ALT 23 17 19   ALKPHOS 60 46 44  BILITOT 1.0 0.6 0.8  PROT 8.6* 6.7 6.7  ALBUMIN 4.2 3.2* 3.3*   Recent Labs  Lab 08/09/23 1731  LIPASE 41   No results for input(s): "AMMONIA" in the last 168 hours. CBC: Recent Labs  Lab 08/09/23 1731 08/10/23 0845  WBC 18.0* 12.3*  NEUTROABS 13.5*  --   HGB 16.8 14.2  HCT 50.0 42.9  MCV 91.1 92.1  PLT 482* 356   Cardiac Enzymes: Recent Labs  Lab 08/09/23 1731  CKTOTAL 237   BNP: Invalid input(s): "POCBNP" CBG: No results for input(s): "GLUCAP" in the last 168 hours. D-Dimer No results for input(s): "DDIMER" in the last 72 hours. Hgb A1c No results for input(s): "HGBA1C" in the last 72 hours. Lipid Profile No results for input(s): "CHOL", "HDL", "LDLCALC", "TRIG", "CHOLHDL", "LDLDIRECT" in the last 72 hours. Thyroid function studies Recent Labs    08/09/23 2143  TSH 1.188   Anemia work up No results for input(s): "VITAMINB12", "FOLATE", "FERRITIN", "TIBC", "IRON", "RETICCTPCT" in the last 72 hours. Urinalysis    Component Value Date/Time   COLORURINE YELLOW 08/10/2023 0419   APPEARANCEUR CLEAR 08/10/2023 0419   LABSPEC 1.013 08/10/2023 0419   PHURINE 5.0 08/10/2023 0419   GLUCOSEU NEGATIVE 08/10/2023 0419   GLUCOSEU NEGATIVE 10/27/2021 1020   HGBUR SMALL (A) 08/10/2023 0419   BILIRUBINUR NEGATIVE 08/10/2023 0419   KETONESUR NEGATIVE 08/10/2023 0419   PROTEINUR 30 (A) 08/10/2023 0419   UROBILINOGEN 0.2 10/27/2021 1020   NITRITE NEGATIVE 08/10/2023 0419   LEUKOCYTESUR NEGATIVE 08/10/2023 0419   Sepsis Labs Recent Labs  Lab  08/09/23 1731 08/10/23 0845  WBC 18.0* 12.3*   Microbiology No results found for this or any previous visit (from the past 240 hours).   Time coordinating discharge: Over 30 minutes  SIGNED:   Alvira Philips [place], DO  [facility] 08/11/2023, 11:48 AM

## 2023-08-11 NOTE — Discharge Instructions (Signed)
 Discontinue use of home blood pressure medications to include carvedilol, olmesartan-hydrochlorothiazide until follow-up with PCP with repeat labs to ensure creatinine continues to return to normal limits/baseline.  Make sure to follow-up with PCP within 7-10 days following hospitalization.

## 2023-08-11 NOTE — Progress Notes (Signed)
   08/11/23 1157  TOC Brief Assessment  Insurance and Status Reviewed  Patient has primary care physician Yes  Home environment has been reviewed Home w/ spouse  Prior level of function: Independent  Prior/Current Home Services No current home services  Social Drivers of Health Review SDOH reviewed no interventions necessary  Readmission risk has been reviewed Yes  Transition of care needs no transition of care needs at this time

## 2023-08-12 ENCOUNTER — Telehealth: Payer: Self-pay

## 2023-08-12 NOTE — Transitions of Care (Post Inpatient/ED Visit) (Signed)
   08/12/2023  Name: Samuel Hawkins MRN: [id number] DOB: Jul 29, 1955  Today's TOC FU Call Status: Today's TOC FU Call Status:: Successful TOC FU Call Completed TOC FU Call Complete Date: 08/12/23 Patient's Name and Date of Birth confirmed.  Transition Care Management Follow-up Telephone Call Date of Discharge: 08/11/23 Discharge Facility: [facility]) Type of Discharge: Inpatient Admission Primary Inpatient Discharge Diagnosis:: AKF How have you been since you were released from the hospital?: Better Any questions or concerns?: No  Items Reviewed: Did you receive and understand the discharge instructions provided?: Yes Medications obtained,verified, and reconciled?: Yes (Medications Reviewed) Any new allergies since your discharge?: No Dietary orders reviewed?: Yes Do you have support at home?: No  Medications Reviewed Today: Medications Reviewed Today     Reviewed by Karena Addison, LPN (Licensed Practical Nurse) on 08/12/23 at [phone]  Med List Status: <None>   Medication Order Taking? Sig Documenting Provider Last Dose Status Informant  aspirin 81 MG chewable tablet [id number] No Chew 81 mg by mouth daily. [provider] 08/08/2023 Active Self, Pharmacy Records  ezetimibe (ZETIA) 10 MG tablet [id number] No Take 1 tablet (10 mg total) by mouth every evening. Yates Decamp, MD 08/08/2023 Active Self, Pharmacy Records           Med Note [facility], SUSAN A   Mon Aug 09, 2023  8:50 PM) Pt adamant of taking medication. Dispense report on Dr. Tiajuana Amass does not support claim.  nitroGLYCERIN (NITROSTAT) 0.4 MG SL tablet [id number] No Place 1 tablet (0.4 mg total) under the tongue every 5 (five) minutes as needed for chest pain. Yates Decamp, MD Taking Active Self, Pharmacy Records  ondansetron [facility]) 4 MG tablet [id number]  Take 1 tablet (4 mg total) by mouth every 6 (six) hours as needed for nausea. Alwyn Ren, MD  Active   pantoprazole (PROTONIX) 40 MG tablet [id number]  No Take 1 tablet (40 mg total) by mouth daily. TAKE ONE TABLET BY MOUTH DAILY BEFORE BREAKFAST AS NEEDED FOR Lorelle Formosa, MD 08/08/2023 Active Self, Pharmacy Records  REPATHA SURECLICK 140 MG/ML SOAJ [id number] No INJECT 140MG  INTO THE SKIN EVERY 14 DAYS Yates Decamp, MD 07/30/2023 Active Self, Pharmacy Records            Home Care and Equipment/Supplies: Were Home Health Services Ordered?: NA Any new equipment or medical supplies ordered?: NA  Functional Questionnaire: Do you need assistance with bathing/showering or dressing?: No Do you need assistance with meal preparation?: No Do you need assistance with eating?: No Do you have difficulty maintaining continence: No Do you need assistance with getting out of bed/getting out of a chair/moving?: No Do you have difficulty managing or taking your medications?: No  Follow up appointments reviewed: PCP Follow-up appointment confirmed?: Yes Date of PCP follow-up appointment?: 08/19/23 Follow-up Provider: [facility] Follow-up appointment confirmed?: NA Do you need transportation to your follow-up appointment?: No Do you understand care options if your condition(s) worsen?: Yes-patient verbalized understanding    SIGNATURE Karena Addison, LPN [facility] Nurse Health Advisor Direct Dial [phone]

## 2023-08-13 ENCOUNTER — Telehealth: Payer: Self-pay | Admitting: Cardiology

## 2023-08-13 DIAGNOSIS — E860 Dehydration: Secondary | ICD-10-CM

## 2023-08-13 NOTE — Telephone Encounter (Signed)
 I spoke with patient and let him know Dr Jacinto Halim was not in the office this afternoon but message would be sent to him.  I advised patient to follow hospital discharge instructions.  Patient aware of appointment next week with PCP.  I told patient once Dr Jacinto Halim reviewed notes we would let him know if any changes recommended.

## 2023-08-13 NOTE — Telephone Encounter (Signed)
 Pt c/o medication issue:  1. Name of Medication:  Carvedilol + Olmesartan  2. How are you currently taking this medication (dosage and times per day)?   3. Are you having a reaction (difficulty breathing--STAT)?   4. What is your medication issue?   Patient wanted to inform Dr. Jacinto Halim states he was recently in the hospital for dehydration and they took him off Carvedilol and Olmesartan because they cause dehydration. He says he hasn't taken them since Monday and wanted to inform Dr. Jacinto Halim + see if he agrees. Please advise.

## 2023-08-14 NOTE — Telephone Encounter (Signed)
 Agree will continue to hold, needs BMP in 1 week to 10 days or prior to next office visit

## 2023-08-16 NOTE — Telephone Encounter (Signed)
 Spoke to patient and lab order was placed. Pt reports that he is supposed to see his PCP on Thursday and he will probably have labs done there as well. Pt advised if PCP does not draw labs then to prepare to have BMP performed in 1 week to 10 days. Pt agrees with plan of care.

## 2023-08-19 ENCOUNTER — Ambulatory Visit: Admitting: Internal Medicine

## 2023-08-19 ENCOUNTER — Encounter: Payer: Self-pay | Admitting: Internal Medicine

## 2023-08-19 VITALS — BP 136/78 | HR 56 | Temp 98.2°F | Ht 67.0 in | Wt 201.0 lb

## 2023-08-19 DIAGNOSIS — Z8639 Personal history of other endocrine, nutritional and metabolic disease: Secondary | ICD-10-CM

## 2023-08-19 DIAGNOSIS — N179 Acute kidney failure, unspecified: Secondary | ICD-10-CM | POA: Diagnosis not present

## 2023-08-19 DIAGNOSIS — I10 Essential (primary) hypertension: Secondary | ICD-10-CM

## 2023-08-19 DIAGNOSIS — E860 Dehydration: Secondary | ICD-10-CM

## 2023-08-19 LAB — BASIC METABOLIC PANEL WITH GFR
BUN: 13 mg/dL (ref 6–23)
CO2: 25 meq/L (ref 19–32)
Calcium: 8.6 mg/dL (ref 8.4–10.5)
Chloride: 108 meq/L (ref 96–112)
Creatinine, Ser: 1.09 mg/dL (ref 0.40–1.50)
GFR: 71.27 mL/min (ref >60.00)
Glucose, Bld: 95 mg/dL (ref 70–99)
Potassium: 3.8 meq/L (ref 3.5–5.1)
Sodium: 138 meq/L (ref 135–145)

## 2023-08-19 LAB — MAGNESIUM: Magnesium: 1.9 mg/dL (ref 1.5–2.5)

## 2023-08-19 NOTE — Patient Instructions (Signed)
 Ok to restart the Carvedilol  Please keep HOLDING the olmesartan/hydrochlorothiazide for now  Please continue all other medications as before, and refills have been done if requested.  Please have the pharmacy call with any other refills you may need.  Please keep your appointments with your specialists as you may have planned  Please go to the LAB at the blood drawing area for the tests to be done  You will be contacted by phone if any changes need to be made immediately.  Otherwise, you will receive a letter about your results with an explanation, but please check with MyChart first.  We will try to fax to Dr Jacinto Halim when the labs are resulted

## 2023-08-19 NOTE — Progress Notes (Signed)
 Patient ID: Samuel Hawkins, male   DOB: [birth year], 68 y.o.   MRN: [id number]        Chief Complaint: follow up post hospn mar 24 - mar 26 with AGE and AKI       HPI:  Samuel Hawkins is a 68 y.o. male here to f/u with above with cr 4..62 with s/s viral AGE, resolved with IVFs to cr 1.41, felt stable for d/c and able for d/c but holding coreg and benicar hct due to lower Bps.  Now oeverall doing very well except has also had cramping to one then the other upper leg muscles a few times, has to get up at night to walk about to relieve.         Wt Readings from Last 3 Encounters:  08/19/23 201 lb (91.2 kg)  08/09/23 189 lb (85.7 kg)  07/29/23 195 lb (88.5 kg)   BP Readings from Last 3 Encounters:  08/19/23 136/78  08/11/23 128/68  08/09/23 110/70         Past Medical History:  Diagnosis Date   CAD (coronary artery disease) 04/2012   Dr. Jacinto Halim: EF 55%; dominant right with diffuse 80% mid vessel disease. Just prior to bifurcation is a 90% stenosis.  D1 90% & LAD 70% after D1 - FFR positive. Large D2 noted.  Proximal circumflex 90% at OM1, ostial OM1 80%. Subtotal OM 2 with TIMI 1 flow (large vessel with several branches).  Ostial ramus 80%.   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   Past Surgical History:  Procedure Laterality Date   CARDIAC CATHETERIZATION     CORONARY ARTERY BYPASS GRAFT  05/04/2011   Procedure: CORONARY ARTERY BYPASS GRAFTING (CABG);  Surgeon: Loreli Slot, MD;  Location: [facility] OR;  Service: Open Heart Surgery;  Laterality: N/A;  CABG x  six;  using bilateral  internal mammary arteries and left leg greater saphenous vein harvested endoscopically  --LIMA-LAD, RIMA-dRCA, SeqSVG-OM3-OM4   CORONARY STENT INTERVENTION N/A 08/02/2018   Procedure: CORONARY STENT INTERVENTION;  Surgeon: Yates Decamp, MD;  Location: [facility] INVASIVE CV LAB;  Service: Cardiovascular;  Laterality: N/A;   LEFT HEART CATH AND CORS/GRAFTS ANGIOGRAPHY N/A 08/02/2018   Procedure: LEFT HEART CATH AND CORS/GRAFTS ANGIOGRAPHY;  Surgeon: Yates Decamp, MD;  Location: [facility] INVASIVE CV LAB;  Service: Cardiovascular;  Laterality: N/A;   LEFT HEART CATHETERIZATION WITH CORONARY ANGIOGRAM N/A 04/30/2011   Procedure: LEFT HEART CATHETERIZATION WITH CORONARY ANGIOGRAM;  Surgeon: Pamella Pert, MD;  Location: [facility] CATH LAB: EF 55%; dominant right with diffuse 80% mid vessel disease. Just prior to bifurcation is a 90% stenosis.  D1 90% & LAD 70% after D1 - FFR positive. Large D2 noted.  Proximal circumflex 90% at OM1, ost OM1 80%. Subtotal OM 2 w/ TIMI 1 flow (large w/ several branches), Ost RI 80%   NM MYOVIEW LTD  11/2012   Stress EKG showed 1.5 mm ST depression at peak exercise recovering less than 2 minutes into recovery. He exercised for 11:52 min. 12.9 METS. Small-moderate-sized inferior-inferolateral transmural scar without infarction. Mild inferior wall hypokinesis. EF 61%. LOW RISK   NM MYOVIEW LTD  08/26/2016   Exercise 11:41 min 12.7 MET  S -peak heart rate 162 BPM (90% of max predicted). Normal blood pressure response = excellent exercise capacity..  EF 50%. Medium size, moderate intensity perfusion defect in the basal inferior-basal lateral and inferolateral wall consistent with prior infarction. No evidence of reversibility/ischemia. (Similar to previous study)   TRANSTHORACIC ECHOCARDIOGRAM  04/2001   EF 55-60%. Mild posterior-inferior hypokinesis. GR 1 DD.    reports that he quit smoking about 12 years ago. His smoking use included cigarettes. He has never used smokeless tobacco. He reports that he does not currently use alcohol. He reports that he does not use drugs. family history  includes Heart attack in his brother; Stroke in his mother. Allergies  Allergen Reactions   Atorvastatin Other (See Comments)    muscle cramp   Simvastatin Other (See Comments)    muscle cramp   Current Outpatient Medications on File Prior to Visit  Medication Sig Dispense Refill   aspirin 81 MG chewable tablet Chew 81 mg by mouth daily.     ezetimibe (ZETIA) 10 MG tablet Take 1 tablet (10 mg total) by mouth every evening. 90 tablet 3   nitroGLYCERIN (NITROSTAT) 0.4 MG SL tablet Place 1 tablet (0.4 mg total) under the tongue every 5 (five) minutes as needed for chest pain. 15 tablet 1   ondansetron (ZOFRAN) 4 MG tablet Take 1 tablet (4 mg total) by mouth every 6 (six) hours as needed for nausea. 20 tablet 0   REPATHA SURECLICK 140 MG/ML SOAJ INJECT 140MG  INTO THE SKIN EVERY 14 DAYS 6 mL 3   pantoprazole (PROTONIX) 40 MG tablet Take 1 tablet (40 mg total) by mouth daily. TAKE ONE TABLET BY MOUTH DAILY BEFORE BREAKFAST AS NEEDED FOR HEARTBURN (Patient not taking: Reported on 08/19/2023) 90 tablet 1   No current facility-administered medications on file prior to visit.        ROS:  All others reviewed and negative.  Objective        PE:  BP 136/78 (BP Location: Right Arm, Patient Position: Sitting, Cuff Size: Normal)   Pulse (!) 56   Temp 98.2 F (36.8 C) (Oral)   Ht 5\' 7"  (1.702 m)   Wt 201 lb (91.2 kg)   SpO2 95%   BMI 31.48 kg/m                 Constitutional: Pt appears in NAD               HENT: Head: NCAT.                Right Ear: External ear normal.                 Left Ear: External ear normal.                Eyes: . Pupils are equal, round, and reactive to light. Conjunctivae and EOM are normal               Nose: without d/c or deformity               Neck: Neck supple. Gross normal ROM               Cardiovascular: Normal rate and regular rhythm.                 Pulmonary/Chest: Effort normal and breath sounds without rales or wheezing.                Abd:  Soft, NT,  ND, + BS, no organomegaly               Neurological: Pt is alert. At baseline orientation, motor grossly intact               Skin: Skin is warm. No rashes, no other new lesions, LE edema - none               Psychiatric: Pt behavior is normal without agitation   Micro: none  Cardiac tracings I have personally interpreted today:  none  Pertinent Radiological findings (summarize): none   Lab Results  Component Value Date   WBC 12.3 (H) 08/10/2023   HGB 14.2 08/10/2023   HCT 42.9 08/10/2023   PLT 356 08/10/2023   GLUCOSE 95 08/19/2023   CHOL 82 (L) 11/16/2022   TRIG 127 11/16/2022   HDL 30 (L) 11/16/2022   LDLDIRECT 176.2 07/10/2010   LDLCALC 29 11/16/2022   ALT 19 08/10/2023   AST 18 08/10/2023   NA 138 08/19/2023   K 3.8 08/19/2023   CL 108 08/19/2023   CREATININE 1.09 08/19/2023   BUN 13 08/19/2023   CO2 25 08/19/2023   TSH 1.188 08/09/2023   PSA 1.51 10/27/2021   INR 1.1 08/09/2023   HGBA1C 5.6 10/27/2021   Assessment/Plan:  Mykal Batiz is a 68 y.o. Other or two or more races [6] male with  has a past medical history of CAD (coronary artery disease) (04/2012), ERECTILE DYSFUNCTION (05/01/2008), Glucose intolerance (impaired glucose tolerance), History of erectile dysfunction, Hyperlipidemia, HYPERLIPIDEMIA (05/01/2008), Hypertension, Impaired glucose tolerance (07/13/2011), Nausea vomiting and diarrhea (08/09/2023), and S/P CABG (coronary artery bypass graft) (07/13/2011).  AKI (acute kidney injury) (HCC) Improved at d/c, for BMP f/u today, also Magnesium with muscle cramping  Dehydration For f/u bmp, clinically resolved  Essential hypertension BP Readings from Last 3 Encounters:  08/19/23 136/78  08/11/23 128/68  08/09/23 110/70   Mild uncontrolled, pt to restart coreg but continue to hold benicar hct  Followup: Return in about 6 months (around 02/18/2024).  Oliver Barre, MD 08/21/2023 8:37 PM [facility]

## 2023-08-20 LAB — BASIC METABOLIC PANEL WITH GFR
BUN/Creatinine Ratio: 11 (ref 10–24)
BUN: 13 mg/dL (ref 8–27)
CO2: 19 mmol/L — ABNORMAL LOW (ref 20–29)
Calcium: 8.5 mg/dL — ABNORMAL LOW (ref 8.6–10.2)
Chloride: 106 mmol/L (ref 96–106)
Creatinine, Ser: 1.15 mg/dL (ref 0.76–1.27)
Glucose: 86 mg/dL (ref 70–99)
Potassium: 4.2 mmol/L (ref 3.5–5.2)
Sodium: 140 mmol/L (ref 134–144)
eGFR: 71 mL/min/{1.73_m2} (ref >59)

## 2023-08-21 ENCOUNTER — Encounter: Payer: Self-pay | Admitting: Internal Medicine

## 2023-08-21 NOTE — Assessment & Plan Note (Signed)
 For f/u bmp, clinically resolved

## 2023-08-21 NOTE — Assessment & Plan Note (Addendum)
 Improved at d/c, for BMP f/u today, also Magnesium with muscle cramping

## 2023-08-21 NOTE — Assessment & Plan Note (Signed)
 BP Readings from Last 3 Encounters:  08/19/23 136/78  08/11/23 128/68  08/09/23 110/70   Mild uncontrolled, pt to restart coreg but continue to hold benicar hct

## 2023-08-30 ENCOUNTER — Telehealth: Payer: Self-pay | Admitting: Internal Medicine

## 2023-08-30 MED ORDER — CYCLOBENZAPRINE HCL 5 MG PO TABS: 5.0000 mg | ORAL_TABLET | Freq: Three times a day (TID) | ORAL | 1 refills | Status: DC | PRN

## 2023-08-30 NOTE — Addendum Note (Signed)
 Addended by: Roslyn Coombe on: 08/30/2023 05:09 PM   Modules accepted: Orders

## 2023-08-30 NOTE — Telephone Encounter (Signed)
 Fortunately coreg does not cause these symptoms  So I will add a muscle relaxer as needed - done erx

## 2023-08-30 NOTE — Telephone Encounter (Signed)
 Pt requesting call from Dr. Equilla Hastings CMA to review recent bloodwork.   Please call: [phone]

## 2023-08-30 NOTE — Telephone Encounter (Signed)
 Copied from CRM [id number]. Topic: Clinical - Prescription Issue >> Aug 30, 2023 12:26 PM Samuel Hawkins wrote: Reason for CRM: Patient stated since he taking Carvedilol  he's been getting cramps below his waist

## 2023-09-29 ENCOUNTER — Telehealth: Payer: Self-pay | Admitting: Pharmacy Technician

## 2023-09-29 NOTE — Telephone Encounter (Signed)
 Pharmacy Patient Advocate Encounter   Received notification from CoverMyMeds that prior authorization for REPATHA  is required/requested.   Insurance verification completed.   The patient is insured through [facility] .   Per test claim: PA required; PA submitted to above mentioned insurance via CoverMyMeds Key/confirmation #/EOC [facility] Status is pending

## 2023-09-29 NOTE — Telephone Encounter (Signed)
 Pharmacy Patient Advocate Encounter  Received notification from [employer] that Prior Authorization for repatha  has been APPROVED from 09/29/23 to 05/17/2038. Spoke to pharmacy to process.Copay is $45.00 for 90 days.     PA #/Case ID/Reference #:  [id number]

## 2023-09-30 ENCOUNTER — Other Ambulatory Visit: Payer: Self-pay

## 2023-09-30 MED ORDER — PANTOPRAZOLE SODIUM 40 MG PO TBEC: DELAYED_RELEASE_TABLET | ORAL | 0 refills | Status: DC

## 2023-10-14 ENCOUNTER — Ambulatory Visit: Payer: Managed Care, Other (non HMO) | Admitting: Cardiology

## 2023-10-14 ENCOUNTER — Ambulatory Visit: Payer: Managed Care, Other (non HMO) | Attending: Cardiology | Admitting: Cardiology

## 2023-10-14 ENCOUNTER — Encounter: Payer: Self-pay | Admitting: Cardiology

## 2023-10-14 ENCOUNTER — Other Ambulatory Visit: Payer: Self-pay | Admitting: *Deleted

## 2023-10-14 VITALS — BP 154/96 | HR 82 | Ht 67.0 in | Wt 200.0 lb

## 2023-10-14 DIAGNOSIS — I25810 Atherosclerosis of coronary artery bypass graft(s) without angina pectoris: Secondary | ICD-10-CM

## 2023-10-14 DIAGNOSIS — E7800 Pure hypercholesterolemia, unspecified: Secondary | ICD-10-CM | POA: Diagnosis not present

## 2023-10-14 DIAGNOSIS — I10 Essential (primary) hypertension: Secondary | ICD-10-CM | POA: Diagnosis not present

## 2023-10-14 DIAGNOSIS — K219 Gastro-esophageal reflux disease without esophagitis: Secondary | ICD-10-CM | POA: Diagnosis not present

## 2023-10-14 MED ORDER — CARVEDILOL 3.125 MG PO TABS: 3.1250 mg | ORAL_TABLET | Freq: Two times a day (BID) | ORAL | 3 refills | Status: AC

## 2023-10-14 MED ORDER — PANTOPRAZOLE SODIUM 40 MG PO TBEC: DELAYED_RELEASE_TABLET | ORAL | 0 refills | Status: DC

## 2023-10-14 MED ORDER — AMLODIPINE BESYLATE 5 MG PO TABS: 5.0000 mg | ORAL_TABLET | Freq: Every day | ORAL | 3 refills | Status: AC

## 2023-10-14 MED ORDER — OLMESARTAN MEDOXOMIL 20 MG PO TABS: 20.0000 mg | ORAL_TABLET | Freq: Every evening | ORAL | 2 refills | Status: DC

## 2023-10-14 MED ORDER — REPATHA SURECLICK 140 MG/ML ~~LOC~~ SOAJ: SUBCUTANEOUS | 3 refills | Status: AC

## 2023-10-14 NOTE — Patient Instructions (Signed)
 Medication Instructions:  Your physician has recommended you make the following change in your medication: Start Amlodipine  5 mg by mouth daily  Start Benicar  20 mg by mouth daily  *If you need a refill on your cardiac medications before your next appointment, please call your pharmacy*  Lab Work: Have lab work (BMP) checked in one week.  Can be done at any [facility] location.  It is not fasting.  There is an office on the first floor of our building If you have labs (blood work) drawn today and your tests are completely normal, you will receive your results only by: MyChart Message (if you have MyChart) OR A paper copy in the mail If you have any lab test that is abnormal or we need to change your treatment, we will call you to review the results.  Testing/Procedures: none  Follow-Up: At [facility], you and your health needs are our priority.  As part of our continuing mission to provide you with exceptional heart care, our providers are all part of one team.  This team includes your primary Cardiologist (physician) and Advanced Practice Providers or APPs (Physician Assistants and Nurse Practitioners) who all work together to provide you with the care you need, when you need it.  Your next appointment:   6 week(s)  Provider:   One of our Advanced Practice Providers (APPs): Melita Springer, PA-C  Friddie Jetty, NP Evaline Hill, NP  Theotis Flake, PA-C Lawana Pray, NP  Willis Harter, PA-C Lovette Rud, PA-C  [place], PA-C Ernest Dick, NP  Marlana Silvan, NP Marcie Sever, PA-C  Laquita Plant, PA-C    Dayna Dunn, PA-C  Scott Weaver, PA-C Palmer Bobo, NP Katlyn West, NP Callie Goodrich, PA-C  Evan Williams, PA-C Sheng Haley, PA-C  Xika Zhao, NP Kathleen Johnson, PA-C       We recommend signing up for the patient portal called "MyChart".  Sign up information is provided on this After Visit Summary.  MyChart is used to connect with patients for Virtual Visits  (Telemedicine).  Patients are able to view lab/test results, encounter notes, upcoming appointments, etc.  Non-urgent messages can be sent to your provider as well.   To learn more about what you can do with MyChart, go to [email or web address].   Other Instructions

## 2023-10-14 NOTE — Progress Notes (Signed)
 Cardiology Office Note:  .   Date:  10/14/2023  ID:  Samuel Hawkins, DOB 1956/04/20, MRN [id number] PCP: Roslyn Coombe, MD   HeartCare Providers Cardiologist:  Knox Perl, MD   History of Present Illness: .   Samuel Hawkins is a 68 y.o. with hypretension, hypercholesterolemia, statin intolerance, coronary artery disease, history of MI in 2012 (CAD with syncope), status post CABG (LIMA-LAD, RIMA-dRCA, other venous grafts occluded), with recurrent syncope sucpicious for ischemia etiology, and angiography revealed high-grade circumflex stenosis S/P PCI on 08/02/2018 (Synergy DES 2.25 X 12 mm to high OM1, Synergy DES 3.5 X 15 mm pLCx).  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  Discussed the use of AI scribe software for clinical note transcription with the patient, who gave verbal consent to proceed.  History of Present Illness Samuel Hawkins "ASH" is a 68 year old male who presents for medication management after a recent hospitalization.  He was hospitalized in March for nausea, vomiting, and diarrhea, during which his carvedilol  and omisartan HCT were stopped.  However he continues to take carvedilol  3.125 mg twice daily since discharge. He is also on Repatha  for cholesterol management and pantoprazole  for heartburn. He is unsure about the exact timing of his Repatha  doses but tries to be consistent.  He engages in physical activity by walking for an hour and a half on weekends, sometimes running uphill, which causes shortness of breath.  He monitors his blood pressure at home using a blood pressure cuff.  Labs   Lab Results  Component Value Date   CHOL 82 (L) 11/16/2022   HDL 30 (L) 11/16/2022   LDLCALC 29 11/16/2022   LDLDIRECT 176.2 07/10/2010   TRIG 127 11/16/2022   CHOLHDL 3 10/27/2021   Lab  Results  Component Value Date   NA 138 08/19/2023   K 3.8 08/19/2023   CO2 25 08/19/2023   GLUCOSE 95 08/19/2023   BUN 13 08/19/2023   CREATININE 1.09 08/19/2023   CALCIUM  8.6 08/19/2023   GFR 71.27 08/19/2023   EGFR 71 08/19/2023   GFRNONAA 55 (L) 08/11/2023      Latest Ref Rng & Units 08/19/2023   11:06 AM 08/19/2023   12:00 AM 08/11/2023    6:35 AM  BMP  Glucose 70 - 99 mg/dL 95  86  92   BUN 6 - 23 mg/dL 13  13  26    Creatinine 0.40 - 1.50 mg/dL 4.09  8.11  9.14   BUN/Creat Ratio 10 - 24  11    Sodium 135 - 145 mEq/L 138  140  137   Potassium 3.5 - 5.1 mEq/L 3.8  4.2  4.3   Chloride 96 - 112 mEq/L 108  106  113   CO2 19 - 32 mEq/L 25  19  18    Calcium  8.4 - 10.5 mg/dL 8.6  8.5  8.1       Latest Ref Rng & Units 08/10/2023    8:45 AM 08/09/2023    5:31 PM 10/27/2021   10:20 AM  CBC  WBC 4.0 - 10.5 K/uL 12.3  18.0  8.0   Hemoglobin 13.0 - 17.0 g/dL 78.2  95.6  21.3   Hematocrit 39.0 - 52.0 % 42.9  50.0  45.2   Platelets 150 - 400 K/uL 356  482  297.0  Lab Results  Component Value Date   HGBA1C 5.6 10/27/2021    Lab Results  Component Value Date   TSH 1.188 08/09/2023     ROS  Review of Systems  Cardiovascular:  Positive for dyspnea on exertion (Extremes of exertion). Negative for chest pain, leg swelling, orthopnea and paroxysmal nocturnal dyspnea.    Physical Exam:   VS:  BP (!) 154/96 (BP Location: Left Arm, Patient Position: Sitting, Cuff Size: Large)   Pulse 82   Ht 5\' 7"  (1.702 m)   Wt 200 lb (90.7 kg)   SpO2 97%   BMI 31.32 kg/m    Wt Readings from Last 3 Encounters:  10/14/23 200 lb (90.7 kg)  08/19/23 201 lb (91.2 kg)  08/09/23 189 lb (85.7 kg)    Physical Exam Constitutional:      Appearance: He is obese.  Neck:     Vascular: No carotid bruit or JVD.  Cardiovascular:     Rate and Rhythm: Normal rate and regular rhythm.     Pulses: Intact distal pulses.     Heart sounds: Normal heart sounds. No murmur heard.    No gallop.  Pulmonary:      Effort: Pulmonary effort is normal.     Breath sounds: Normal breath sounds.  Abdominal:     General: Bowel sounds are normal.     Palpations: Abdomen is soft.  Musculoskeletal:     Right lower leg: No edema.     Left lower leg: No edema.    Studies Reviewed: Aaron Aas    Coronary angiogram 08/02/2018:  High OM1 with 2.25 x 12 mm Synergy DES, main circumflex proximal branch with 3.5 x 15 mm Synergy DES      EKG:       EKG 08/09/2023: Normal sinus rhythm at rate of 56 bpm, cannot exclude inferior infarct old.  No evidence of ischemia.  Compared to 10/14/2022 Q waves minimally prominent in the inferior leads probably normal.  Medications and allergies    Allergies  Allergen Reactions   Atorvastatin Other (See Comments)    muscle cramp   Simvastatin Other (See Comments)    muscle cramp     Current Outpatient Medications:    amLODipine (NORVASC) 5 MG tablet, Take 1 tablet (5 mg total) by mouth daily., Disp: 90 tablet, Rfl: 3   olmesartan  (BENICAR ) 20 MG tablet, Take 1 tablet (20 mg total) by mouth every evening., Disp: 30 tablet, Rfl: 2   aspirin  81 MG chewable tablet, Chew 81 mg by mouth daily., Disp: , Rfl:    carvedilol  (COREG ) 3.125 MG tablet, Take 1 tablet (3.125 mg total) by mouth 2 (two) times daily with a meal., Disp: 180 tablet, Rfl: 3   cyclobenzaprine  (FLEXERIL ) 5 MG tablet, Take 1 tablet (5 mg total) by mouth 3 (three) times daily as needed., Disp: 40 tablet, Rfl: 1   Evolocumab  (REPATHA  SURECLICK) 140 MG/ML SOAJ, INJECT 140MG  INTO THE SKIN EVERY 14 DAYS, Disp: 6 mL, Rfl: 3   ezetimibe  (ZETIA ) 10 MG tablet, Take 1 tablet (10 mg total) by mouth every evening., Disp: 90 tablet, Rfl: 3   nitroGLYCERIN  (NITROSTAT ) 0.4 MG SL tablet, Place 1 tablet (0.4 mg total) under the tongue every 5 (five) minutes as needed for chest pain., Disp: 15 tablet, Rfl: 1   ondansetron  (ZOFRAN ) 4 MG tablet, Take 1 tablet (4 mg total) by mouth every 6 (six) hours as needed for nausea., Disp: 20  tablet, Rfl: 0   pantoprazole  (PROTONIX ) 40 MG  tablet, TAKE ONE TABLET BY MOUTH DAILY BEFORE BREAKFAST AS NEEDED FOR HEARTBURN, Disp: 90 tablet, Rfl: 0   Meds ordered this encounter  Medications   Evolocumab  (REPATHA  SURECLICK) 140 MG/ML SOAJ    Sig: INJECT 140MG  INTO THE SKIN EVERY 14 DAYS    Dispense:  6 mL    Refill:  3   pantoprazole  (PROTONIX ) 40 MG tablet    Sig: TAKE ONE TABLET BY MOUTH DAILY BEFORE BREAKFAST AS NEEDED FOR HEARTBURN    Dispense:  90 tablet    Refill:  0    Pt must keep upcoming appt in May 2025 with Dr. Berry Bristol before anymore refills. Thank you Final Attempt   carvedilol  (COREG ) 3.125 MG tablet    Sig: Take 1 tablet (3.125 mg total) by mouth 2 (two) times daily with a meal.    Dispense:  180 tablet    Refill:  3   olmesartan  (BENICAR ) 20 MG tablet    Sig: Take 1 tablet (20 mg total) by mouth every evening.    Dispense:  30 tablet    Refill:  2   amLODipine (NORVASC) 5 MG tablet    Sig: Take 1 tablet (5 mg total) by mouth daily.    Dispense:  90 tablet    Refill:  3     Medications Discontinued During This Encounter  Medication Reason   REPATHA  SURECLICK 140 MG/ML SOAJ Reorder   pantoprazole  (PROTONIX ) 40 MG tablet Reorder   carvedilol  (COREG ) 3.125 MG tablet Reorder     ASSESSMENT AND PLAN: .      ICD-10-CM   1. Coronary artery disease involving coronary bypass graft of native heart without angina pectoris  I25.810 Evolocumab  (REPATHA  SURECLICK) 140 MG/ML SOAJ    Basic Metabolic Panel (BMET)    2. Essential hypertension  I10 olmesartan  (BENICAR ) 20 MG tablet    amLODipine (NORVASC) 5 MG tablet    Basic Metabolic Panel (BMET)    3. Hypercholesteremia  E78.00 Evolocumab  (REPATHA  SURECLICK) 140 MG/ML SOAJ    4. Gastroesophageal reflux disease without esophagitis  K21.9 pantoprazole  (PROTONIX ) 40 MG tablet      Assessment and Plan Assessment & Plan Hypertension Hypertension management was affected by the discontinuation of omisartan HCT 40/12.5  mg during a recent hospitalization due to gastroenteritis in March, renal function has returned back to his baseline. Current blood pressure is very high. The decision was made to restart omisartan at a lower dose of 20 mg once daily in the evening. Carvedilol  3.125 mg twice daily will be continued.  Will add amlodipine 5 mg daily he is advised to monitor blood pressure at home and report any significant drops or dizziness. He is instructed to keep well hydrated, especially during travel. - Restart omisartan 20 mg once daily in the evening. - Continue carvedilol  3.125 mg twice daily. - Monitor blood pressure at home and report if it drops to 100 or if dizziness occurs. - Maintain adequate hydration, especially during travel. - BMP in a week  Hyperlipidemia Hyperlipidemia is being managed with Repatha . Cholesterol levels are currently well-controlled. He is advised to maintain regular dosing of Repatha , acknowledging occasional missed doses are acceptable. - Continue Repatha  as prescribed. - Order refill for Repatha .  Gastroesophageal reflux disease (GERD) GERD is well-managed with pantoprazole , which he finds effective. A refill for pantoprazole  is requested and will be provided. - Order refill for pantoprazole .  CAD SP CABG x 4 in 2012, coronary angiogram in 2020 revealing patent LIMA to LAD and SVG  to right PDA and underwent angioplasty and stenting to proximal CX and OM1. No further angina, remains stable, chronic dyspnea with extremes of exertion as he recently started exercising running uphill.  Do not suspect progression of CAD, recent exercise stress test on 07/20/2023 revealing excellent exercise tolerance without ischemia by EKG.    External labs reviewed, lipids under excellent control.  I will schedule him to see NP/PA in about 4 to 6 weeks for hypertension management and if he remains stable I will see him back in a year.  Signed,  Knox Perl, MD, [facility] 10/14/2023, 8:32 AM [facility] [address] [place], [place] [address] Phone: [phone]. Fax:  [phone]

## 2023-10-21 ENCOUNTER — Ambulatory Visit: Payer: Self-pay

## 2023-10-21 LAB — BASIC METABOLIC PANEL WITH GFR
BUN/Creatinine Ratio: 17 (ref 10–24)
BUN: 17 mg/dL (ref 8–27)
CO2: 20 mmol/L (ref 20–29)
Calcium: 9.4 mg/dL (ref 8.6–10.2)
Chloride: 103 mmol/L (ref 96–106)
Creatinine, Ser: 1.03 mg/dL (ref 0.76–1.27)
Glucose: 110 mg/dL — ABNORMAL HIGH (ref 70–99)
Potassium: 4.7 mmol/L (ref 3.5–5.2)
Sodium: 139 mmol/L (ref 134–144)
eGFR: 81 mL/min/{1.73_m2} (ref >59)

## 2023-10-22 NOTE — Progress Notes (Signed)
 Renal function is back to baseline after recent gastroenteritis.  Continue present medications.

## 2023-11-08 ENCOUNTER — Other Ambulatory Visit: Payer: Self-pay

## 2023-11-08 DIAGNOSIS — I10 Essential (primary) hypertension: Secondary | ICD-10-CM

## 2023-11-08 MED ORDER — OLMESARTAN MEDOXOMIL 20 MG PO TABS: 20.0000 mg | ORAL_TABLET | Freq: Every evening | ORAL | 3 refills | Status: DC

## 2023-11-11 NOTE — Telephone Encounter (Signed)
 Pt requesting a c/b from the nurse in regards to results. Please advise

## 2023-11-21 NOTE — Progress Notes (Deleted)
 Cardiology Office Note:    Date:  11/21/2023   ID:  Samuel Hawkins, DOB 07/13/1955, MRN [id number]  PCP:  Norleen Lynwood ORN, MD   [facility] Providers Cardiologist:  Gordy Bergamo, MD { Click to update primary MD,subspecialty MD or APP then REFRESH:1}    Referring MD: Norleen Lynwood ORN, MD   No chief complaint on file. ***  History of Present Illness:    Samuel Hawkins is a 68 y.o. male with a hx of CAD s/p MI 2012 now s/p CABG with LIMA-LAD, RIMA-dRCA, otherwise occluded SVGs.  Prior syncope related to CAD.  Due to recurrent syncope, LHC repeated showed high-grade LCx stenosis s/p PCI 07/2018 (DES 2.25 x 12 mm OM1, DES 3.5 x 15 mm pLCX).  DOT physical resulted in echocardiogram and exercise stress test 07/2023 with no ischemia and preserved LVEF without significant valvular disease.      CAD s/p CABG with patent LIMA-LAD, RIMA-dRCA -Occluded SVG-LCx, SVG-OM1 -DES-LCx, DES-OM1 in 2020 -She is on aspirin  monotherapy - Continue 3.125 mg Coreg  twice daily, 20 mg olmesartan     Hyperlipidemia with LDL goal less than 70 - Intolerant to statins - Continue Zetia , Repatha     Hypertension - Continue 5 mg amlodipine , olmesartan , low-dose Coreg       Past Medical History:  Diagnosis Date   CAD (coronary artery disease) 04/2012   Dr. Bergamo: EF 55%; dominant right with diffuse 80% mid vessel disease. Just prior to bifurcation is a 90% stenosis.  D1 90% & LAD 70% after D1 - FFR positive. Large D2 noted.  Proximal circumflex 90% at OM1, ostial OM1 80%. Subtotal OM 2 with TIMI 1 flow (large vessel with several branches).  Ostial ramus 80%.   ERECTILE DYSFUNCTION 05/01/2008   Glucose intolerance (impaired glucose tolerance)    history of   History of erectile dysfunction    Hyperlipidemia    HYPERLIPIDEMIA 05/01/2008   Hypertension    Impaired glucose tolerance 07/13/2011   Nausea vomiting and diarrhea 08/09/2023   S/P CABG (coronary artery bypass graft) 07/13/2011   LIMA-LAD,  RIMA-dRCA, SeqSVG-OM3-OM4.    Past Surgical History:  Procedure Laterality Date   CARDIAC CATHETERIZATION     CORONARY ARTERY BYPASS GRAFT  05/04/2011   Procedure: CORONARY ARTERY BYPASS GRAFTING (CABG);  Surgeon: Elspeth JAYSON Millers, MD;  Location: [facility] OR;  Service: Open Heart Surgery;  Laterality: N/A;  CABG x  six;  using bilateral  internal mammary arteries and left leg greater saphenous vein harvested endoscopically --LIMA-LAD, RIMA-dRCA, SeqSVG-OM3-OM4   CORONARY STENT INTERVENTION N/A 08/02/2018   Procedure: CORONARY STENT INTERVENTION;  Surgeon: Bergamo Gordy, MD;  Location: [facility] IN[facility];  Service: Cardiovascular;  Laterality: N/A;   LEFT HEART CATH AND CORS/GRAFTS ANGIOGRAPHY N/A 08/02/2018   Procedure: LEFT HEART CATH AND CORS/GRAFTS ANGIOGRAPHY;  Surgeon: Bergamo Gordy, MD;  Location: [facility] INVAS[facility] CV [facility];  Service: Cardiovascular;  Laterality: N/A;   LEFT HEART CATHETERIZATION WITH CORONARY ANGIOGRAM N/A 04/30/2011   Procedure: LEFT HEART CATHETERIZATION WITH CORONARY ANGIOGRAM;  Surgeon: Erick JONELLE Bergamo, MD;  Location: [facility] CATH LAB: EF 55%; dominant right with diffuse 80% mid vessel disease. Just prior to bifurcation is a 90% stenosis.  D1 90% & LAD 70% after D1 - FFR positive. Large D2 noted.  Proximal circumflex 90% at OM1, ost OM1 80%. Subtotal OM 2 w/ TIMI 1 flow (large w/ several branches), Ost RI 80%   NM MYOVIEW  LTD  11/2012   Stress EKG showed 1.5 mm ST depression at peak exercise  recovering less than 2 minutes into recovery. He exercised for 11:52 min. 12.9 METS. Small-moderate-sized inferior-inferolateral transmural scar without infarction. Mild inferior wall hypokinesis. EF 61%. LOW RISK   NM MYOVIEW  LTD  08/26/2016   Exercise 11:41 min 12.7 MET S -peak heart rate 162 BPM (90% of max predicted). Normal blood pressure response = excellent exercise capacity..  EF 50%. Medium size, moderate intensity perfusion defect in the basal inferior-basal lateral and inferolateral wall  consistent with prior infarction. No evidence of reversibility/ischemia. (Similar to previous study)   TRANSTHORACIC ECHOCARDIOGRAM  04/2001   EF 55-60%. Mild posterior-inferior hypokinesis. GR 1 DD.    Current Medications: No outpatient medications have been marked as taking for the 11/24/23 encounter (Appointment) with Madie Jon Garre, PA.     Allergies:   Atorvastatin and Simvastatin   Social History   Socioeconomic History   Marital status: Divorced    Spouse name: Not on file   Number of children: 1   Years of education: Not on file   Highest education level: Not on file  Occupational History   Occupation: Tax inspector company    Employer: [employer]  Tobacco Use   Smoking status: Former    Current packs/day: 0.00    Types: Cigarettes    Quit date: 05/10/2011    Years since quitting: 12.5   Smokeless tobacco: Never   Tobacco comments:    social smoker only prior to MI  Vaping Use   Vaping status: Never Used  Substance and Sexual Activity   Alcohol use: Not Currently   Drug use: No   Sexual activity: Never  Other Topics Concern   Not on file  Social History Narrative   Not on file   Social Drivers of Health   Financial Resource Strain: Not on file  Food Insecurity: Not on file  Transportation Needs: Not on file  Physical Activity: Not on file  Stress: Not on file  Social Connections: Not on file     Family History: The patient's ***family history includes Heart attack in his brother; Stroke in his mother.  ROS:   Please see the history of present illness.    *** All other systems reviewed and are negative.  EKGs/Labs/Other Studies Reviewed:    The following studies were reviewed today: ***      Recent Labs: 08/09/2023: TSH 1.188 08/10/2023: ALT 19; Hemoglobin 14.2; Platelets 356 08/19/2023: Magnesium  1.9 10/20/2023: BUN 17; Creatinine, Ser 1.03; Potassium 4.7; Sodium 139  Recent Lipid Panel    Component Value Date/Time   CHOL 82 (L) 11/16/2022 1044    TRIG 127 11/16/2022 1044   TRIG 240 (HH) 04/30/2006 0816   HDL 30 (L) 11/16/2022 1044   CHOLHDL 3 10/27/2021 1020   VLDL 15.4 10/27/2021 1020   LDLCALC 29 11/16/2022 1044   LDLDIRECT 176.2 07/10/2010 1146     Risk Assessment/Calculations:   {Does this patient have ATRIAL FIBRILLATION?[phone]}  No BP recorded.  {Refresh Note OR Click here to enter BP  :1}***         Physical Exam:    VS:  There were no vitals taken for this visit.    Wt Readings from Last 3 Encounters:  10/14/23 200 lb (90.7 kg)  08/19/23 201 lb (91.2 kg)  08/09/23 189 lb (85.7 kg)     GEN: *** Well nourished, well developed in no acute distress HEENT: Normal NECK: No JVD; No carotid bruits LYMPHATICS: No lymphadenopathy CARDIAC: ***RRR, no murmurs, rubs, gallops RESPIRATORY:  Clear to auscultation without  rales, wheezing or rhonchi  ABDOMEN: Soft, non-tender, non-distended MUSCULOSKELETAL:  No edema; No deformity  SKIN: Warm and dry NEUROLOGIC:  Alert and oriented x 3 PSYCHIATRIC:  Normal affect   ASSESSMENT:    No diagnosis found. PLAN:    In order of problems listed above:  ***      {Are you ordering a CV Procedure (e.g. stress test, cath, DCCV, TEE, etc)?   Press F2        :[phone]}    Medication Adjustments/Labs and Tests Ordered: Current medicines are reviewed at length with the patient today.  Concerns regarding medicines are outlined above.  No orders of the defined types were placed in this encounter.  No orders of the defined types were placed in this encounter.   There are no Patient Instructions on file for this visit.   Signed, Jon Nat Hails, [place]  11/21/2023 9:18 PM    [facility]

## 2023-11-24 ENCOUNTER — Encounter: Payer: Self-pay | Admitting: Physician Assistant

## 2023-11-24 ENCOUNTER — Ambulatory Visit: Attending: Physician Assistant | Admitting: Physician Assistant

## 2023-11-24 ENCOUNTER — Ambulatory Visit: Admitting: Physician Assistant

## 2023-11-24 VITALS — BP 130/80 | HR 58 | Ht 67.0 in | Wt 197.6 lb

## 2023-11-24 DIAGNOSIS — I2510 Atherosclerotic heart disease of native coronary artery without angina pectoris: Secondary | ICD-10-CM

## 2023-11-24 DIAGNOSIS — Z9861 Coronary angioplasty status: Secondary | ICD-10-CM

## 2023-11-24 DIAGNOSIS — I10 Essential (primary) hypertension: Secondary | ICD-10-CM | POA: Diagnosis not present

## 2023-11-24 DIAGNOSIS — E785 Hyperlipidemia, unspecified: Secondary | ICD-10-CM | POA: Diagnosis not present

## 2023-11-24 DIAGNOSIS — Z951 Presence of aortocoronary bypass graft: Secondary | ICD-10-CM

## 2023-11-24 NOTE — Patient Instructions (Signed)
 Medication Instructions:  Stop Losartan   *If you need a refill on your cardiac medications before your next appointment, please call your pharmacy*  Lab Work: NONE ordered at this time of appointment   Testing/Procedures: NONE ordered at this time of appointment   Follow-Up: At [facility], you and your health needs are our priority.  As part of our continuing mission to provide you with exceptional heart care, our providers are all part of one team.  This team includes your primary Cardiologist (physician) and Advanced Practice Providers or APPs (Physician Assistants and Nurse Practitioners) who all work together to provide you with the care you need, when you need it.  Your next appointment:   2 months Stoney Duke PA) 6 months (Dr. Ladona)  Provider:   Gordy Ladona, MD or Jon Hails, PA-C          We recommend signing up for the patient portal called MyChart.  Sign up information is provided on this After Visit Summary.  MyChart is used to connect with patients for Virtual Visits (Telemedicine).  Patients are able to view lab/test results, encounter notes, upcoming appointments, etc.  Non-urgent messages can be sent to your provider as well.   To learn more about what you can do with MyChart, go to [email or web address].   Other Instructions Monitor blood pressure. BP should be less than 130/80. Please send a message through mychart or call if BP is consistently greater than 140.90.  You may cancel 2 month appointment if your blood pressure I ok.

## 2023-11-24 NOTE — Progress Notes (Signed)
 Cardiology Office Note:    Date:  11/24/2023   ID:  Samuel Hawkins, DOB 10/05/1955, MRN [id number]  PCP:  Norleen Lynwood ORN, MD   [facility] Providers Cardiologist:  Gordy Bergamo, MD Cardiology APP:  Madie Jon Garre, PA     Referring MD: Norleen Lynwood ORN, MD   Chief Complaint  Patient presents with   Follow-up    HTN    History of Present Illness:    Samuel Hawkins is a 68 y.o. male with a hx of CAD s/p MI 2012 now s/p CABG with LIMA-LAD, RIMA-dRCA, otherwise occluded SVGs.  Prior syncope related to CAD.  Due to recurrent syncope, LHC repeated showed high-grade LCx stenosis s/p PCI 07/2018 (DES 2.25 x 12 mm OM1, DES 3.5 x 15 mm pLCX).  DOT physical resulted in echocardiogram and exercise stress test 07/2023 with no ischemia and preserved LVEF without significant valvular disease.  He was hospitalized earlier this year with gastroenteritis and AKI resulting in ARB discontinuation. On follow up, his BP was elevated and lower dose of 20 mg olmesartan  was restarted.   He presents back for BP follow up. He just climbed up 5 flights of stairs in our building without chest pain. He does not check his BP at home. He walks on the weekends, but no dedicated exercise routine. He walks 1.5-2 hrs on the weekends.   At first some confusion over medications as he brings a paper that lists losartan  and olmesartan . I confirmed he is only taking olmesartan .   He has a wrist BP cuff. I have instructed BP log.    Past Medical History:  Diagnosis Date   CAD (coronary artery disease) 04/2012   Dr. Bergamo: EF 55%; dominant right with diffuse 80% mid vessel disease. Just prior to bifurcation is a 90% stenosis.  D1 90% & LAD 70% after D1 - FFR positive. Large D2 noted.  Proximal circumflex 90% at OM1, ostial OM1 80%. Subtotal OM 2 with TIMI 1 flow (large vessel with several branches).  Ostial ramus 80%.   ERECTILE DYSFUNCTION 05/01/2008   Glucose intolerance (impaired glucose tolerance)    history of    History of erectile dysfunction    Hyperlipidemia    HYPERLIPIDEMIA 05/01/2008   Hypertension    Impaired glucose tolerance 07/13/2011   Nausea vomiting and diarrhea 08/09/2023   S/P CABG (coronary artery bypass graft) 07/13/2011   LIMA-LAD, RIMA-dRCA, SeqSVG-OM3-OM4.    Past Surgical History:  Procedure Laterality Date   CARDIAC CATHETERIZATION     CORONARY ARTERY BYPASS GRAFT  05/04/2011   Procedure: CORONARY ARTERY BYPASS GRAFTING (CABG);  Surgeon: Elspeth JAYSON Millers, MD;  Location: [facility] OR;  Service: Open Heart Surgery;  Laterality: N/A;  CABG x  six;  using bilateral  internal mammary arteries and left leg greater saphenous vein harvested endoscopically --LIMA-LAD, RIMA-dRCA, SeqSVG-OM3-OM4   CORONARY STENT INTERVENTION N/A 08/02/2018   Procedure: CORONARY STENT INTERVENTION;  Surgeon: Bergamo Gordy, MD;  Location: [facility];  Service: Cardiovascular;  Laterality: N/A;   LEFT HEART CATH AND CORS/GRAFTS ANGIOGRAPHY N/A 08/02/2018   Procedure: LEFT HEART CATH AND CORS/GRAFTS ANGIOGRAPHY;  Surgeon: Bergamo Gordy, MD;  Location: [facility];  Service: Cardiovascular;  Laterality: N/A;   LEFT HEART CATHETERIZATION WITH CORONARY ANGIOGRAM N/A 04/30/2011   Procedure: LEFT HEART CATHETERIZATION WITH CORONARY ANGIOGRAM;  Surgeon: Erick JONELLE Bergamo, MD;  Location: [facility] CATH LAB: EF 55%; dominant right with diffuse 80% mid vessel disease. Just prior to bifurcation is a 90%  stenosis.  D1 90% & LAD 70% after D1 - FFR positive. Large D2 noted.  Proximal circumflex 90% at OM1, ost OM1 80%. Subtotal OM 2 w/ TIMI 1 flow (large w/ several branches), Ost RI 80%   NM MYOVIEW  LTD  11/2012   Stress EKG showed 1.5 mm ST depression at peak exercise recovering less than 2 minutes into recovery. He exercised for 11:52 min. 12.9 METS. Small-moderate-sized inferior-inferolateral transmural scar without infarction. Mild inferior wall hypokinesis. EF 61%. LOW RISK   NM MYOVIEW  LTD  08/26/2016   Exercise 11:41 min  12.7 MET S -peak heart rate 162 BPM (90% of max predicted). Normal blood pressure response = excellent exercise capacity..  EF 50%. Medium size, moderate intensity perfusion defect in the basal inferior-basal lateral and inferolateral wall consistent with prior infarction. No evidence of reversibility/ischemia. (Similar to previous study)   TRANSTHORACIC ECHOCARDIOGRAM  04/2001   EF 55-60%. Mild posterior-inferior hypokinesis. GR 1 DD.    Current Medications: Current Meds  Medication Sig   amLODipine  (NORVASC ) 5 MG tablet Take 1 tablet (5 mg total) by mouth daily.   carvedilol  (COREG ) 3.125 MG tablet Take 1 tablet (3.125 mg total) by mouth 2 (two) times daily with a meal.   Evolocumab  (REPATHA  SURECLICK) 140 MG/ML SOAJ INJECT 140MG  INTO THE SKIN EVERY 14 DAYS   nitroGLYCERIN  (NITROSTAT ) 0.4 MG SL tablet Place 1 tablet (0.4 mg total) under the tongue every 5 (five) minutes as needed for chest pain.   pantoprazole  (PROTONIX ) 40 MG tablet TAKE ONE TABLET BY MOUTH DAILY BEFORE BREAKFAST AS NEEDED FOR HEARTBURN   [DISCONTINUED] olmesartan  (BENICAR ) 20 MG tablet Take 1 tablet (20 mg total) by mouth every evening.     Allergies:   Atorvastatin and Simvastatin   Social History   Socioeconomic History   Marital status: Divorced    Spouse name: Not on file   Number of children: 1   Years of education: Not on file   Highest education level: Not on file  Occupational History   Occupation: Tax inspector company    Employer: [employer]  Tobacco Use   Smoking status: Former    Current packs/day: 0.00    Types: Cigarettes    Quit date: 05/10/2011    Years since quitting: 12.5   Smokeless tobacco: Never   Tobacco comments:    social smoker only prior to MI  Vaping Use   Vaping status: Never Used  Substance and Sexual Activity   Alcohol use: Not Currently   Drug use: No   Sexual activity: Never  Other Topics Concern   Not on file  Social History Narrative   Not on file   Social Drivers of  Health   Financial Resource Strain: Not on file  Food Insecurity: Not on file  Transportation Needs: Not on file  Physical Activity: Not on file  Stress: Not on file  Social Connections: Not on file     Family History: The patient's family history includes Heart attack in his brother; Stroke in his mother.  ROS:   Please see the history of present illness.     All other systems reviewed and are negative.  EKGs/Labs/Other Studies Reviewed:    The following studies were reviewed today:       Recent Labs: 08/09/2023: TSH 1.188 08/10/2023: ALT 19; Hemoglobin 14.2; Platelets 356 08/19/2023: Magnesium  1.9 10/20/2023: BUN 17; Creatinine, Ser 1.03; Potassium 4.7; Sodium 139  Recent Lipid Panel    Component Value Date/Time   CHOL 82 (L) 11/16/2022  1044   TRIG 127 11/16/2022 1044   TRIG 240 (HH) 04/30/2006 0816   HDL 30 (L) 11/16/2022 1044   CHOLHDL 3 10/27/2021 1020   VLDL 15.4 10/27/2021 1020   LDLCALC 29 11/16/2022 1044   LDLDIRECT 176.2 07/10/2010 1146     Risk Assessment/Calculations:                Physical Exam:    VS:  BP 130/80   Pulse (!) 58   Ht 5' 7 (1.702 m)   Wt 197 lb 9.6 oz (89.6 kg)   SpO2 95%   BMI 30.95 kg/m     Wt Readings from Last 3 Encounters:  11/24/23 197 lb 9.6 oz (89.6 kg)  10/14/23 200 lb (90.7 kg)  08/19/23 201 lb (91.2 kg)     GEN:  Well nourished, well developed in no acute distress HEENT: Normal NECK: No JVD; No carotid bruits LYMPHATICS: No lymphadenopathy CARDIAC: RRR, no murmurs, rubs, gallops RESPIRATORY:  Clear to auscultation without rales, wheezing or rhonchi  ABDOMEN: Soft, non-tender, non-distended MUSCULOSKELETAL:  No edema; No deformity  SKIN: Warm and dry NEUROLOGIC:  Alert and oriented x 3 PSYCHIATRIC:  Normal affect   ASSESSMENT:    1. CAD S/P percutaneous coronary angioplasty   2. S/P CABG (coronary artery bypass graft)   3. Essential hypertension   4. 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 Ladona in 6 months           Medication Adjustments/Labs and Tests Ordered: Current medicines are reviewed at length with the patient today.  Concerns regarding medicines are outlined above.  No orders of the defined types were placed in this encounter.  No orders of the defined types were placed in this encounter.   Patient Instructions  Medication Instructions:  Stop Losartan   *If you need a refill on your cardiac medications before your next appointment, please call your pharmacy*  Lab Work: NONE ordered at this time of appointment   Testing/Procedures: NONE ordered at this time of appointment   Follow-Up: At [facility], you and your health needs are our priority.  As part of our continuing mission to provide you with exceptional heart care, our providers are all part of one team.  This team includes your primary Cardiologist (physician) and Advanced Practice Providers or APPs (Physician Assistants and Nurse Practitioners) who all work together to provide you with the care you need, when you need it.  Your next appointment:   2 months Stoney Rashee Marschall PA) 6 months (Dr. Ladona)  Provider:   Gordy Ladona, MD or  Jon Hails, PA-C  We recommend signing up for the patient portal called MyChart.  Sign up information is provided on this After Visit Summary.  MyChart is used to connect with patients for Virtual Visits (Telemedicine).  Patients are able to view lab/test results, encounter notes, upcoming appointments, etc.  Non-urgent messages can be sent to your provider as well.   To learn more about what you can do with MyChart, go to [email or web address].   Other Instructions Monitor blood pressure. BP should be less than 130/80. Please send a message through mychart or call if BP is consistently greater than 140.90.  You may cancel 2 month appointment if your blood pressure I ok.        Signed, Jon Nat Hails, [place]  11/24/2023 2:38 PM    [facility]

## 2024-01-10 ENCOUNTER — Other Ambulatory Visit: Payer: Self-pay

## 2024-01-10 DIAGNOSIS — K219 Gastro-esophageal reflux disease without esophagitis: Secondary | ICD-10-CM

## 2024-01-10 MED ORDER — PANTOPRAZOLE SODIUM 40 MG PO TBEC: DELAYED_RELEASE_TABLET | ORAL | 3 refills | Status: AC

## 2024-01-18 ENCOUNTER — Ambulatory Visit: Admitting: Physician Assistant

## 2024-01-25 ENCOUNTER — Telehealth: Payer: Self-pay | Admitting: Cardiology

## 2024-01-25 NOTE — Telephone Encounter (Signed)
 Left message for patient to callback.   From Angie Duke, PA-C in last office visit note on 11/24/23: Hypertension - Continue 5 mg amlodipine , 20 mg olmesartan , low-dose Coreg  -- did not restart the hydrochlorothiazide portion of his olmesartan  today -- recommend BP log -- he works 2-3rd shift, but taking coreg  about 12 hrs apart -- will check BP log  Olmesartan  was discontinued by CMA, but AVS states: Stop Losartan .  Will forward to Angie to review and advise on which medications patient needs to continue. Patient has appt with Angie on 9/17.

## 2024-01-25 NOTE — Telephone Encounter (Signed)
  Pt c/o medication issue:  1. Name of Medication:   amLODipine  (NORVASC ) 5 MG tablet  pantoprazole  (PROTONIX ) 40 MG tablet  carvedilol  (COREG ) 3.125 MG tablet   Olmesartan  20 mg Losartan  50 mg  2. How are you currently taking this medication (dosage and times per day)? As directed  3. Are you having a reaction (difficulty breathing--STAT)? NA  4. What is your medication issue?   Patient needs to discuss his medications. He states he is mixed up and not sure what he is supposed to be taking and when he is supposed to take them.  Patient states he is still taking the Olmesartan  and Losartan  both  Patient is asking for a call back after 3 pm today.

## 2024-01-27 ENCOUNTER — Other Ambulatory Visit: Payer: Self-pay

## 2024-01-27 MED ORDER — OLMESARTAN MEDOXOMIL 20 MG PO TABS: 20.0000 mg | ORAL_TABLET | Freq: Every day | ORAL | 3 refills | Status: AC

## 2024-01-27 NOTE — Telephone Encounter (Signed)
 Spoke with pt. Pt is currently taking Carvedilol  & Reflux medication in the afternoons and taking Carvedilol , Olmesartan  & Amlodipine  5 mg at night. Pt is aware that he shouldn't take Losartan .

## 2024-01-27 NOTE — Telephone Encounter (Signed)
 He should only be taking olmesartan . Please instruct him not to take the losartan  any longer.  If he needs refills, can you please send in?

## 2024-02-01 NOTE — Progress Notes (Deleted)
 Cardiology Office Note:    Date:  02/01/2024   ID:  Samuel Hawkins, DOB [birth year], MRN [id number]  PCP:  Norleen Lynwood ORN, MD   [facility] Providers Cardiologist:  Gordy Bergamo, MD Cardiology APP:  Madie Jon Garre, PA { Click to update primary MD,subspecialty MD or APP then REFRESH:1}    Referring MD: Norleen Lynwood ORN, MD   No chief complaint on file. ***  History of Present Illness:    Samuel Hawkins is a 68 y.o. male with a hx of Samuel Hawkins is a 68 y.o. male with a hx of CAD s/p MI 2012 now s/p CABG with LIMA-LAD, RIMA-dRCA, otherwise occluded SVGs.  Prior syncope related to CAD.  Due to recurrent syncope, LHC repeated showed high-grade LCx stenosis s/p PCI 07/2018 (DES 2.25 x 12 mm OM1, DES 3.5 x 15 mm pLCX).  DOT physical resulted in echocardiogram and exercise stress test 07/2023 with no ischemia and preserved LVEF without significant valvular disease.  He was hospitalized 2025 with gastroenteritis and AKI that resulted in D/C ARB. Olmesartan  later restarted at reduced dose of 20 mg. When I saw him in follow up, he climbed 5 flights to the office. Confusion regarding losartan  and olmesartan  - I reiterated discontinuation of losartan , continue olmesartan . Did not restart hydrochlorothiazide.   He presents back today with BP log using new arm BP cuff (not wrist). He works 2nd to 3rd shift.      CAD s/p CABG with patent LIMA-LAD, RIMA-dRCA -Occluded SVG-LCx, SVG-OM1 -DES-LCx, DES-OM1 in 2020 -She is on aspirin  monotherapy - Continue 3.125 mg Coreg  twice daily, 20 mg olmesartan  -- have not restarted hydrochlorothiazide     Hyperlipidemia with LDL goal less than 70 - Intolerant to statins - Continue Zetia , Repatha     Hypertension - Continue 5 mg amlodipine , olmesartan , low-dose Coreg         Past Medical History:  Diagnosis Date   CAD (coronary artery disease) 04/2012   Dr. Bergamo: EF 55%; dominant right with diffuse 80% mid vessel disease. Just prior to  bifurcation is a 90% stenosis.  D1 90% & LAD 70% after D1 - FFR positive. Large D2 noted.  Proximal circumflex 90% at OM1, ostial OM1 80%. Subtotal OM 2 with TIMI 1 flow (large vessel with several branches).  Ostial ramus 80%.   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    Past Surgical History:  Procedure Laterality Date   CARDIAC CATHETERIZATION     CORONARY ARTERY BYPASS GRAFT  05/04/2011   Procedure: CORONARY ARTERY BYPASS GRAFTING (CABG);  Surgeon: Elspeth JAYSON Millers, MD;  Location: [facility] OR;  Service: Open Heart Surgery;  Laterality: N/A;  CABG x  six;  using bilateral  internal mammary arteries and left leg greater saphenous vein harvested endoscopically --LIMA-LAD, RIMA-dRCA, SeqSVG-OM3-OM4   CORONARY STENT INTERVENTION N/A 08/02/2018   Procedure: CORONARY STENT INTERVENTION;  Surgeon: Bergamo Gordy, MD;  Location: [facility] INVASIVE CV LAB;  Service: Cardiovascular;  Laterality: N/A;   LEFT HEART CATH AND CORS/GRAFTS ANGIOGRAPHY N/A 08/02/2018   Procedure: LEFT HEART CATH AND CORS/GRAFTS ANGIOGRAPHY;  Surgeon: Bergamo Gordy, MD;  Location: [facility] INVASIVE CV LAB;  Service: Cardiovascular;  Laterality: N/A;  LEFT HEART CATHETERIZATION WITH CORONARY ANGIOGRAM N/A 04/30/2011   Procedure: LEFT HEART CATHETERIZATION WITH CORONARY ANGIOGRAM;  Surgeon: Erick JONELLE Bergamo, MD;  Location: [facility] CATH LAB: EF 55%; dominant right with diffuse 80% mid vessel disease. Just prior to bifurcation is a 90% stenosis.  D1 90% & LAD 70% after D1 - FFR positive. Large D2 noted.  Proximal circumflex 90% at OM1, ost OM1 80%. Subtotal OM 2 w/ TIMI 1 flow (large w/ several branches), Ost RI 80%   NM MYOVIEW  LTD   11/2012   Stress EKG showed 1.5 mm ST depression at peak exercise recovering less than 2 minutes into recovery. He exercised for 11:52 min. 12.9 METS. Small-moderate-sized inferior-inferolateral transmural scar without infarction. Mild inferior wall hypokinesis. EF 61%. LOW RISK   NM MYOVIEW  LTD  08/26/2016   Exercise 11:41 min 12.7 MET S -peak heart rate 162 BPM (90% of max predicted). Normal blood pressure response = excellent exercise capacity..  EF 50%. Medium size, moderate intensity perfusion defect in the basal inferior-basal lateral and inferolateral wall consistent with prior infarction. No evidence of reversibility/ischemia. (Similar to previous study)   TRANSTHORACIC ECHOCARDIOGRAM  04/2001   EF 55-60%. Mild posterior-inferior hypokinesis. GR 1 DD.    Current Medications: No outpatient medications have been marked as taking for the 02/02/24 encounter (Appointment) with Madie Jon Garre, PA.     Allergies:   Atorvastatin and Simvastatin   Social History   Socioeconomic History   Marital status: Divorced    Spouse name: Not on file   Number of children: 1   Years of education: Not on file   Highest education level: Not on file  Occupational History   Occupation: Tax inspector company    Employer: [employer]  Tobacco Use   Smoking status: Former    Current packs/day: 0.00    Types: Cigarettes    Quit date: 05/10/2011    Years since quitting: 12.7   Smokeless tobacco: Never   Tobacco comments:    social smoker only prior to MI  Vaping Use   Vaping status: Never Used  Substance and Sexual Activity   Alcohol use: Not Currently   Drug use: No   Sexual activity: Never  Other Topics Concern   Not on file  Social History Narrative   Not on file   Social Drivers of Health   Financial Resource Strain: Not on file  Food Insecurity: Not on file  Transportation Needs: Not on file  Physical Activity: Not on file  Stress: Not on file  Social Connections: Not on file      Family History: The patient's ***family history includes Heart attack in his brother; Stroke in his mother.  ROS:   Please see the history of present illness.    *** All other systems reviewed and are negative.  EKGs/Labs/Other Studies Reviewed:    The following studies were reviewed today: ***      Recent Labs: 08/09/2023: TSH 1.188 08/10/2023: ALT 19; Hemoglobin 14.2; Platelets 356 08/19/2023: Magnesium  1.9 10/20/2023: BUN 17; Creatinine, Ser 1.03; Potassium 4.7; Sodium 139  Recent Lipid Panel    Component Value Date/Time   CHOL 82 (L) 11/16/2022 1044   TRIG 127 11/16/2022 1044   TRIG 240 (HH) 04/30/2006 0816   HDL 30 (L) 11/16/2022 1044   CHOLHDL 3 10/27/2021 1020   VLDL 15.4 10/27/2021 1020   LDLCALC 29 11/16/2022 1044   LDLDIRECT 176.2 07/10/2010 1146     Risk Assessment/Calculations:   {Does this patient have ATRIAL FIBRILLATION?:[id number]}  No BP recorded.  {Refresh Note OR Click here to enter BP  :1}***         Physical Exam:    VS:  There were no vitals taken for this visit.    Wt Readings from Last 3 Encounters:  11/24/23 197 lb 9.6 oz (89.6 kg)  10/14/23 200 lb (90.7 kg)  08/19/23 201 lb (91.2 kg)     GEN: *** Well nourished, well developed in no acute distress HEENT: Normal NECK: No JVD; No carotid bruits LYMPHATICS: No lymphadenopathy CARDIAC: ***RRR, no murmurs, rubs, gallops RESPIRATORY:  Clear to auscultation without rales, wheezing or rhonchi  ABDOMEN: Soft, non-tender, non-distended MUSCULOSKELETAL:  No edema; No deformity  SKIN: Warm and dry NEUROLOGIC:  Alert and oriented x 3 PSYCHIATRIC:  Normal affect   ASSESSMENT:    No diagnosis found. PLAN:    In order of problems listed above:  ***      {Are you ordering a CV Procedure (e.g. stress test, cath, DCCV, TEE, etc)?   Press F2        :[id number]}    Medication Adjustments/Labs and Tests Ordered: Current medicines are reviewed at length with the patient today.  Concerns  regarding medicines are outlined above.  No orders of the defined types were placed in this encounter.  No orders of the defined types were placed in this encounter.   There are no Patient Instructions on file for this visit.   Signed, Jon Garre Sabena Winner, PA  02/01/2024 10:10 AM    [facility]

## 2024-02-02 ENCOUNTER — Ambulatory Visit: Attending: Cardiology | Admitting: Physician Assistant

## 2024-02-18 ENCOUNTER — Ambulatory Visit: Admitting: Internal Medicine

## 2024-03-01 ENCOUNTER — Ambulatory Visit: Admitting: Internal Medicine

## 2024-04-27 ENCOUNTER — Encounter: Payer: Self-pay | Admitting: Cardiology

## 2024-06-13 ENCOUNTER — Telehealth: Payer: Self-pay

## 2024-06-13 NOTE — Telephone Encounter (Signed)
 Copied from CRM #[id number]. Topic: General - Other >> Jun 13, 2024 10:41 AM Mesmerise C wrote: Reason for CRM: Patient was feeling under the weather but feeling better now only has a light headache but has missed work since yesterday plans to hopefully return Thursday would like a doctor's note to return with states they asked for one when he comes back would like a call back if can be done

## 2024-06-19 NOTE — Telephone Encounter (Signed)
 Pt needs to be seen
